# Patient Record
Sex: Female | Born: 1935 | Race: White | Hispanic: No | Smoking: Never smoker
Health system: Southern US, Community
[De-identification: ages and names within clinical notes are randomized; demographics above are authoritative.]

## PROBLEM LIST (undated history)

## (undated) DIAGNOSIS — S72009A Fracture of unspecified part of neck of unspecified femur, initial encounter for closed fracture: Secondary | ICD-10-CM

## (undated) DIAGNOSIS — G43909 Migraine, unspecified, not intractable, without status migrainosus: Secondary | ICD-10-CM

## (undated) DIAGNOSIS — I2510 Atherosclerotic heart disease of native coronary artery without angina pectoris: Secondary | ICD-10-CM

## (undated) DIAGNOSIS — R112 Nausea with vomiting, unspecified: Secondary | ICD-10-CM

## (undated) DIAGNOSIS — N390 Urinary tract infection, site not specified: Secondary | ICD-10-CM

## (undated) DIAGNOSIS — F32A Depression, unspecified: Secondary | ICD-10-CM

## (undated) DIAGNOSIS — F419 Anxiety disorder, unspecified: Secondary | ICD-10-CM

## (undated) DIAGNOSIS — C4490 Unspecified malignant neoplasm of skin, unspecified: Secondary | ICD-10-CM

## (undated) DIAGNOSIS — F329 Major depressive disorder, single episode, unspecified: Secondary | ICD-10-CM

## (undated) DIAGNOSIS — C539 Malignant neoplasm of cervix uteri, unspecified: Secondary | ICD-10-CM

## (undated) DIAGNOSIS — E162 Hypoglycemia, unspecified: Secondary | ICD-10-CM

## (undated) DIAGNOSIS — E785 Hyperlipidemia, unspecified: Secondary | ICD-10-CM

## (undated) DIAGNOSIS — J449 Chronic obstructive pulmonary disease, unspecified: Secondary | ICD-10-CM

## (undated) DIAGNOSIS — J45909 Unspecified asthma, uncomplicated: Secondary | ICD-10-CM

## (undated) DIAGNOSIS — Z9889 Other specified postprocedural states: Secondary | ICD-10-CM

## (undated) DIAGNOSIS — M1990 Unspecified osteoarthritis, unspecified site: Secondary | ICD-10-CM

## (undated) DIAGNOSIS — R011 Cardiac murmur, unspecified: Secondary | ICD-10-CM

## (undated) DIAGNOSIS — D649 Anemia, unspecified: Secondary | ICD-10-CM

## (undated) HISTORY — PX: MULTIPLE TOOTH EXTRACTIONS: SHX2053

## (undated) HISTORY — PX: VAGINAL HYSTERECTOMY: SUR661

## (undated) HISTORY — PX: APPENDECTOMY: SHX54

## (undated) HISTORY — DX: Fracture of unspecified part of neck of unspecified femur, initial encounter for closed fracture: S72.009A

## (undated) HISTORY — PX: CATARACT EXTRACTION W/ INTRAOCULAR LENS  IMPLANT, BILATERAL: SHX1307

## (undated) HISTORY — PX: SKIN CANCER EXCISION: SHX779

---

## 2001-10-06 ENCOUNTER — Other Ambulatory Visit: Admission: RE | Admit: 2001-10-06 | Discharge: 2001-10-06 | Payer: Self-pay | Admitting: Family Medicine

## 2003-04-28 ENCOUNTER — Other Ambulatory Visit: Admission: RE | Admit: 2003-04-28 | Discharge: 2003-04-28 | Payer: Self-pay | Admitting: Family Medicine

## 2003-06-25 ENCOUNTER — Encounter: Payer: Self-pay | Admitting: *Deleted

## 2003-06-25 ENCOUNTER — Emergency Department (HOSPITAL_COMMUNITY): Admission: EM | Admit: 2003-06-25 | Discharge: 2003-06-25 | Payer: Self-pay | Admitting: *Deleted

## 2003-11-13 ENCOUNTER — Emergency Department (HOSPITAL_COMMUNITY): Admission: AD | Admit: 2003-11-13 | Discharge: 2003-11-13 | Payer: Self-pay | Admitting: Emergency Medicine

## 2008-01-08 ENCOUNTER — Ambulatory Visit (HOSPITAL_COMMUNITY): Admission: RE | Admit: 2008-01-08 | Discharge: 2008-01-08 | Payer: Self-pay | Admitting: Internal Medicine

## 2008-04-27 ENCOUNTER — Emergency Department (HOSPITAL_COMMUNITY): Admission: EM | Admit: 2008-04-27 | Discharge: 2008-04-27 | Payer: Self-pay | Admitting: Emergency Medicine

## 2009-10-17 ENCOUNTER — Encounter: Admission: RE | Admit: 2009-10-17 | Discharge: 2009-10-17 | Payer: Self-pay | Admitting: Family Medicine

## 2009-10-24 ENCOUNTER — Encounter: Admission: RE | Admit: 2009-10-24 | Discharge: 2009-10-24 | Payer: Self-pay | Admitting: Family Medicine

## 2010-07-11 ENCOUNTER — Encounter: Admission: RE | Admit: 2010-07-11 | Discharge: 2010-07-11 | Payer: Self-pay | Admitting: Family Medicine

## 2011-08-02 LAB — POCT I-STAT, CHEM 8
Creatinine, Ser: 0.9
Glucose, Bld: 85
HCT: 35 — ABNORMAL LOW
Hemoglobin: 11.9 — ABNORMAL LOW
Potassium: 4.1
Sodium: 142
TCO2: 26

## 2011-08-02 LAB — URINALYSIS, ROUTINE W REFLEX MICROSCOPIC
Nitrite: NEGATIVE
Protein, ur: NEGATIVE
Specific Gravity, Urine: 1.009
Urobilinogen, UA: 0.2

## 2011-08-02 LAB — DIFFERENTIAL
Basophils Relative: 1
Eosinophils Absolute: 0.2
Monocytes Relative: 9
Neutro Abs: 3.2
Neutrophils Relative %: 44

## 2011-08-02 LAB — CBC
Hemoglobin: 11.7 — ABNORMAL LOW
MCHC: 34.7
RBC: 3.58 — ABNORMAL LOW
RDW: 13.7

## 2011-08-02 LAB — CK: Total CK: 188 — ABNORMAL HIGH

## 2011-08-02 LAB — URINE MICROSCOPIC-ADD ON

## 2013-08-14 ENCOUNTER — Other Ambulatory Visit (HOSPITAL_COMMUNITY): Payer: Self-pay | Admitting: Family Medicine

## 2013-08-14 ENCOUNTER — Ambulatory Visit (HOSPITAL_COMMUNITY)
Admission: RE | Admit: 2013-08-14 | Discharge: 2013-08-14 | Disposition: A | Discharge status: Home / Self Care | Payer: Medicare Other | Source: Ambulatory Visit | Attending: Family Medicine | Admitting: Family Medicine

## 2013-08-14 DIAGNOSIS — R52 Pain, unspecified: Secondary | ICD-10-CM

## 2013-08-14 DIAGNOSIS — M25559 Pain in unspecified hip: Secondary | ICD-10-CM | POA: Insufficient documentation

## 2013-08-14 DIAGNOSIS — M51379 Other intervertebral disc degeneration, lumbosacral region without mention of lumbar back pain or lower extremity pain: Secondary | ICD-10-CM | POA: Insufficient documentation

## 2013-08-14 DIAGNOSIS — M5137 Other intervertebral disc degeneration, lumbosacral region: Secondary | ICD-10-CM | POA: Insufficient documentation

## 2013-08-14 DIAGNOSIS — M1710 Unilateral primary osteoarthritis, unspecified knee: Secondary | ICD-10-CM | POA: Insufficient documentation

## 2013-08-14 DIAGNOSIS — M899 Disorder of bone, unspecified: Secondary | ICD-10-CM | POA: Insufficient documentation

## 2015-05-18 ENCOUNTER — Encounter (HOSPITAL_COMMUNITY): Payer: Self-pay | Admitting: Emergency Medicine

## 2015-05-18 ENCOUNTER — Emergency Department (HOSPITAL_COMMUNITY)
Admission: EM | Admit: 2015-05-18 | Discharge: 2015-05-18 | Disposition: A | Discharge status: Home / Self Care | Payer: Medicare Other | Attending: Emergency Medicine | Admitting: Emergency Medicine

## 2015-05-18 ENCOUNTER — Emergency Department (HOSPITAL_COMMUNITY): Payer: Medicare Other

## 2015-05-18 DIAGNOSIS — J441 Chronic obstructive pulmonary disease with (acute) exacerbation: Secondary | ICD-10-CM | POA: Insufficient documentation

## 2015-05-18 DIAGNOSIS — F419 Anxiety disorder, unspecified: Secondary | ICD-10-CM | POA: Diagnosis not present

## 2015-05-18 DIAGNOSIS — Z862 Personal history of diseases of the blood and blood-forming organs and certain disorders involving the immune mechanism: Secondary | ICD-10-CM | POA: Diagnosis not present

## 2015-05-18 DIAGNOSIS — R531 Weakness: Secondary | ICD-10-CM | POA: Diagnosis not present

## 2015-05-18 DIAGNOSIS — E119 Type 2 diabetes mellitus without complications: Secondary | ICD-10-CM | POA: Insufficient documentation

## 2015-05-18 DIAGNOSIS — Z880 Allergy status to penicillin: Secondary | ICD-10-CM | POA: Insufficient documentation

## 2015-05-18 DIAGNOSIS — R0602 Shortness of breath: Secondary | ICD-10-CM

## 2015-05-18 DIAGNOSIS — Z8739 Personal history of other diseases of the musculoskeletal system and connective tissue: Secondary | ICD-10-CM | POA: Diagnosis not present

## 2015-05-18 DIAGNOSIS — Z8541 Personal history of malignant neoplasm of cervix uteri: Secondary | ICD-10-CM | POA: Insufficient documentation

## 2015-05-18 DIAGNOSIS — Z91040 Latex allergy status: Secondary | ICD-10-CM | POA: Diagnosis not present

## 2015-05-18 DIAGNOSIS — R200 Anesthesia of skin: Secondary | ICD-10-CM | POA: Diagnosis not present

## 2015-05-18 HISTORY — DX: Unspecified osteoarthritis, unspecified site: M19.90

## 2015-05-18 HISTORY — DX: Major depressive disorder, single episode, unspecified: F32.9

## 2015-05-18 HISTORY — DX: Malignant neoplasm of cervix uteri, unspecified: C53.9

## 2015-05-18 HISTORY — DX: Anemia, unspecified: D64.9

## 2015-05-18 HISTORY — DX: Depression, unspecified: F32.A

## 2015-05-18 HISTORY — DX: Chronic obstructive pulmonary disease, unspecified: J44.9

## 2015-05-18 HISTORY — DX: Hyperlipidemia, unspecified: E78.5

## 2015-05-18 HISTORY — DX: Anxiety disorder, unspecified: F41.9

## 2015-05-18 HISTORY — DX: Unspecified asthma, uncomplicated: J45.909

## 2015-05-18 LAB — CBC WITH DIFFERENTIAL/PLATELET
Basophils Absolute: 0.1 10*3/uL (ref 0.0–0.1)
Basophils Relative: 1 % (ref 0–1)
Eosinophils Absolute: 0.1 10*3/uL (ref 0.0–0.7)
Eosinophils Relative: 1 % (ref 0–5)
HEMATOCRIT: 35.7 % — AB (ref 36.0–46.0)
HEMOGLOBIN: 11.6 g/dL — AB (ref 12.0–15.0)
LYMPHS ABS: 2.7 10*3/uL (ref 0.7–4.0)
Lymphocytes Relative: 27 % (ref 12–46)
MCH: 31.1 pg (ref 26.0–34.0)
MCHC: 32.5 g/dL (ref 30.0–36.0)
MCV: 95.7 fL (ref 78.0–100.0)
MONOS PCT: 9 % (ref 3–12)
Monocytes Absolute: 0.9 10*3/uL (ref 0.1–1.0)
Neutro Abs: 6.3 10*3/uL (ref 1.7–7.7)
Neutrophils Relative %: 62 % (ref 43–77)
PLATELETS: 226 10*3/uL (ref 150–400)
RBC: 3.73 MIL/uL — ABNORMAL LOW (ref 3.87–5.11)
RDW: 14 % (ref 11.5–15.5)
WBC: 10.2 10*3/uL (ref 4.0–10.5)

## 2015-05-18 LAB — URINALYSIS, ROUTINE W REFLEX MICROSCOPIC
Bilirubin Urine: NEGATIVE
GLUCOSE, UA: NEGATIVE mg/dL
HGB URINE DIPSTICK: NEGATIVE
KETONES UR: NEGATIVE mg/dL
NITRITE: POSITIVE — AB
Protein, ur: NEGATIVE mg/dL
Specific Gravity, Urine: 1.008 (ref 1.005–1.030)
Urobilinogen, UA: 0.2 mg/dL (ref 0.0–1.0)
pH: 7 (ref 5.0–8.0)

## 2015-05-18 LAB — COMPREHENSIVE METABOLIC PANEL
ALK PHOS: 50 U/L (ref 38–126)
ALT: 16 U/L (ref 14–54)
ANION GAP: 8 (ref 5–15)
AST: 28 U/L (ref 15–41)
Albumin: 3.6 g/dL (ref 3.5–5.0)
BUN: 14 mg/dL (ref 6–20)
CALCIUM: 8.9 mg/dL (ref 8.9–10.3)
CHLORIDE: 111 mmol/L (ref 101–111)
CO2: 22 mmol/L (ref 22–32)
Creatinine, Ser: 0.97 mg/dL (ref 0.44–1.00)
GFR calc non Af Amer: 52 mL/min — ABNORMAL LOW (ref >60)
Glucose, Bld: 97 mg/dL (ref 65–99)
Potassium: 5.1 mmol/L (ref 3.5–5.1)
SODIUM: 141 mmol/L (ref 135–145)
Total Bilirubin: 1 mg/dL (ref 0.3–1.2)
Total Protein: 6.7 g/dL (ref 6.5–8.1)

## 2015-05-18 LAB — URINE MICROSCOPIC-ADD ON

## 2015-05-18 LAB — I-STAT TROPONIN, ED: Troponin i, poc: 0.07 ng/mL (ref 0.00–0.08)

## 2015-05-18 LAB — CBG MONITORING, ED: GLUCOSE-CAPILLARY: 112 mg/dL — AB (ref 65–99)

## 2015-05-18 MED ORDER — CEPHALEXIN 500 MG PO CAPS: 500.0000 mg | ORAL_CAPSULE | Freq: Three times a day (TID) | ORAL | Status: AC

## 2015-05-18 MED ORDER — SODIUM CHLORIDE 0.9 % IV BOLUS (SEPSIS)
500.0000 mL | Freq: Once | INTRAVENOUS | Status: AC
  Administered 2015-05-18: 500 mL via INTRAVENOUS

## 2015-05-18 MED ORDER — DEXTROSE 5 % IV SOLN
1.0000 g | Freq: Once | INTRAVENOUS | Status: AC
  Administered 2015-05-18: 1 g via INTRAVENOUS
  Filled 2015-05-18: qty 10

## 2015-05-18 NOTE — ED Notes (Signed)
Pt in xray

## 2015-05-18 NOTE — Discharge Instructions (Signed)
Shortness of Breath °Shortness of breath means you have trouble breathing. Shortness of breath needs medical care right away. °HOME CARE  °· Do not smoke. °· Avoid being around chemicals or things (paint fumes, dust) that may bother your breathing. °· Rest as needed. Slowly begin your normal activities. °· Only take medicines as told by your doctor. °· Keep all doctor visits as told. °GET HELP RIGHT AWAY IF:  °· Your shortness of breath gets worse. °· You feel lightheaded, pass out (faint), or have a cough that is not helped by medicine. °· You cough up blood. °· You have pain with breathing. °· You have pain in your chest, arms, shoulders, or belly (abdomen). °· You have a fever. °· You cannot walk up stairs or exercise the way you normally do. °· You do not get better in the time expected. °· You have a hard time doing normal activities even with rest. °· You have problems with your medicines. °· You have any new symptoms. °MAKE SURE YOU: °· Understand these instructions. °· Will watch your condition. °· Will get help right away if you are not doing well or get worse. °Document Released: 04/09/2008 Document Revised: 10/27/2013 Document Reviewed: 01/07/2012 °Exit[employer]® Patient Information ©2015 [employer]. This information is not intended to replace advice given to you by your health care provider. Make sure you discuss any questions you have with your health care provider. ° °

## 2015-05-18 NOTE — ED Provider Notes (Addendum)
CSN: [id number]     Arrival date & time 05/18/15  1125 History   First MD Initiated Contact with Patient 05/18/15 1128     Chief Complaint  Patient presents with  . Anxiety     (Consider location/radiation/quality/duration/timing/severity/associated sxs/prior Treatment) Patient is a 81 y.o. female presenting with anxiety and shortness of breath. The history is provided by the patient.  Anxiety Associated symptoms include shortness of breath. Pertinent negatives include no chest pain, no abdominal pain and no headaches.  Shortness of Breath Severity:  Mild Onset quality:  Gradual Timing:  Constant Progression:  Resolved Chronicity:  New Context comment:  While walking outside Relieved by:  Nothing Worsened by:  Nothing tried Ineffective treatments:  None tried Associated symptoms: no abdominal pain, no chest pain, no cough, no fever, no headaches, no neck pain and no vomiting     Past Medical History  Diagnosis Date  . Diabetes mellitus without complication   . Hyperlipidemia   . Anxiety   . Depression   . Arthritis   . Asthma   . COPD (chronic obstructive pulmonary disease)   . Cancer   . Cervical cancer   . Anemia    Past Surgical History  Procedure Laterality Date  . Appendectomy    . Abdominal hysterectomy    . Cataract extraction     History reviewed. No pertinent family history. History  Substance Use Topics  . Smoking status: Never Smoker   . Smokeless tobacco: Never Used  . Alcohol Use: No   OB History    No data available     Review of Systems  Constitutional: Negative for fever and fatigue.  HENT: Negative for congestion and drooling.   Eyes: Negative for pain.  Respiratory: Positive for shortness of breath. Negative for cough.   Cardiovascular: Negative for chest pain.  Gastrointestinal: Negative for nausea, vomiting, abdominal pain and diarrhea.  Genitourinary: Negative for dysuria and hematuria.  Musculoskeletal: Negative for back pain, gait  problem and neck pain.  Skin: Negative for color change.  Neurological: Positive for weakness. Negative for dizziness and headaches.  Hematological: Negative for adenopathy.  Psychiatric/Behavioral: Negative for behavioral problems.  All other systems reviewed and are negative.     Allergies  Latex and Penicillins  Home Medications   Prior to Admission medications   Not on File   BP 185/67 mmHg  Pulse 102  Temp(Src) 98.2 F (36.8 C) (Oral)  Resp 12  SpO2 96% Physical Exam  Constitutional: She is oriented to person, place, and time. She appears well-developed and well-nourished.  HENT:  Head: Normocephalic and atraumatic.  Mouth/Throat: Oropharynx is clear and moist. No oropharyngeal exudate.  Eyes: Conjunctivae and EOM are normal. Pupils are equal, round, and reactive to light.  Neck: Normal range of motion. Neck supple.  Cardiovascular: Normal rate, regular rhythm, normal heart sounds and intact distal pulses.  Exam reveals no gallop and no friction rub.   No murmur heard. Pulmonary/Chest: Effort normal and breath sounds normal. No respiratory distress. She has no wheezes.  Abdominal: Soft. Bowel sounds are normal. There is no tenderness. There is no rebound and no guarding.  Musculoskeletal: Normal range of motion. She exhibits no edema or tenderness.  Neurological: She is alert and oriented to person, place, and time.  alert, oriented x3 speech: normal in context and clarity memory: intact grossly cranial nerves II-XII: intact motor strength: full proximally and distally Mild tremulousness sensation: intact to light touch diffusely  cerebellar: finger-to-nose and heel-to-shin intact gait:  Normal gait  Skin: Skin is warm and dry.  Psychiatric: She has a normal mood and affect. Her behavior is normal.  Nursing note and vitals reviewed.   ED Course  Procedures (including critical care time) Labs Review Labs Reviewed  CBC WITH DIFFERENTIAL/PLATELET - Abnormal;  Notable for the following:    RBC 3.73 (*)    Hemoglobin 11.6 (*)    HCT 35.7 (*)    All other components within normal limits  COMPREHENSIVE METABOLIC PANEL - Abnormal; Notable for the following:    GFR calc non Af Amer 52 (*)    All other components within normal limits  URINALYSIS, ROUTINE W REFLEX MICROSCOPIC (NOT AT [facility]) - Abnormal; Notable for the following:    APPearance CLOUDY (*)    Nitrite POSITIVE (*)    Leukocytes, UA SMALL (*)    All other components within normal limits  URINE MICROSCOPIC-ADD ON - Abnormal; Notable for the following:    Squamous Epithelial / LPF FEW (*)    Bacteria, UA MANY (*)    All other components within normal limits  CBG MONITORING, ED - Abnormal; Notable for the following:    Glucose-Capillary 112 (*)    All other components within normal limits  URINE CULTURE  I-STAT TROPOININ, ED    Imaging Review Dg Chest 2 View  05/18/2015   CLINICAL DATA:  Hypoglycemia. Weakness. Subsequent tachycardia after drinking orange juice. Shortness of breath.  EXAM: CHEST  2 VIEW  COMPARISON:  07/11/2010  FINDINGS: Stable left basilar scarring. Mild chronic interstitial accentuation, not changed from 2009.  Mild atherosclerotic calcification in the aortic arch.  IMPRESSION: 1. Chronic scarring in the lingula. Mild chronic interstitial accentuation. No acute findings. 2. Mild aortic arch atherosclerotic calcification.   Electronically Signed   By: Van Clines M.D.   On: 05/18/2015 13:35   Ct Head Wo Contrast  05/18/2015   CLINICAL DATA:  Confusion  EXAM: CT HEAD WITHOUT CONTRAST  TECHNIQUE: Contiguous axial images were obtained from the base of the skull through the vertex without intravenous contrast.  COMPARISON:  Report of prior study June 25, 2003 available; images from that study cannot be retrieved.  FINDINGS: The ventricles are normal in size and configuration for age. There is no intracranial mass, hemorrhage, extra-axial fluid collection, or midline  shift. There is moderate small vessel disease throughout the centra semiovale bilaterally. Elsewhere gray-white compartments appear normal. There is no demonstrable acute infarct. The bony calvarium appears intact. The mastoid air cells are clear. There is opacification of several ethmoid air cells bilaterally, more on the right than on the left.  IMPRESSION: Periventricular small vessel disease. No acute infarct appreciable. No hemorrhage or mass effect. Ethmoid sinus disease, more on the right than on the left.   Electronically Signed   By: Lowella Grip III M.D.   On: 05/18/2015 13:46     EKG Interpretation   Date/Time:  Wednesday May 18 2015 11:39:41 EDT Ventricular Rate:  105 PR Interval:  161 QRS Duration: 89 QT Interval:  350 QTC Calculation: 463 R Axis:   -19 Text Interpretation:  Sinus tachycardia Borderline left axis deviation  Confirmed by Jeris Easterly  MD, Glendi Mohiuddin ([id number]) on 05/18/2015 12:54:47 PM      MDM   Final diagnoses:  SOB (shortness of breath)    12:15 PM 81 y.o. female with a history of diabetes, COPD, anemia who presents with shortness of breath. She states that she was walking outside a little bit before 10 PM this  morning. She had gradual onset shortness of breath consistent with mild asthma and states that she went inside to use her inhaler. She states that she developed some numbness of her entire head and did not feel like the inhaler hasn't helped. She became very anxious. She states that she drank orange juice with sugar and [facility] as she thought that she may have low blood sugar. Her symptoms resolved in route. She is currently asymptomatic. She denies any chest pain or shortness of breath. She has a normal neurologic exam with the exception of some generalized weakness. Vital signs showing a heart rate of 102 but otherwise unremarkable. We'll get screening labs and imaging.  4:07 PM: Found to have UTI, got rocephin here. She remains asx on exam. VSS.  Pt  ambulating here w/out difficulty. Possibly a hypoglycemic episode that she had earlier given resolution by the time she go to the hospital.  I have discussed the diagnosis/risks/treatment options with the patient and family and believe the pt to be eligible for discharge home to follow-up with her pcp. We also discussed returning to the ED immediately if new or worsening sx occur. We discussed the sx which are most concerning (e.g., sob, cp, fever) that necessitate immediate return. Medications administered to the patient during their visit and any new prescriptions provided to the patient are listed below.  Medications given during this visit Medications  sodium chloride 0.9 % bolus 500 mL (0 mLs Intravenous Stopped 05/18/15 1427)  cefTRIAXone (ROCEPHIN) 1 g in dextrose 5 % 50 mL IVPB (1 g Intravenous New Bag/Given 05/18/15 1536)    New Prescriptions   CEPHALEXIN (KEFLEX) 500 MG CAPSULE    Take 1 capsule (500 mg total) by mouth 3 (three) times daily.     Pamella Pert, MD 05/18/15 2112  Pamella Pert, MD 05/18/15 2113

## 2015-05-18 NOTE — ED Notes (Signed)
Pt from home via [facility].  Pt was doing yard work, came inside to use her inhaler.  Pt told EMS she was feeling like her blood sugar was low, drank orange juice and called family.  Pt didn't feel like the juice was helping, was hyperventilating, reports having head numbness, confusion, and shaking.  Pt reports feeling scared.  Calm on arrival to ED.  Pt in NAD, A&O.

## 2015-05-20 LAB — URINE CULTURE

## 2015-05-21 ENCOUNTER — Telehealth: Payer: Self-pay | Admitting: Emergency Medicine

## 2015-05-21 NOTE — Telephone Encounter (Signed)
Post ED Visit - Positive Culture Follow-up: Successful Patient Follow-Up  Culture assessed and recommendations reviewed by: []  Heide Guile, Pharm.D., BCPS-AQ ID []  Alycia Rossetti, Pharm.D., BCPS []  [place], Pharm.D., BCPS, AAHIVP []  Legrand Como, Pharm.D., BCPS, AAHIVP []  Tegan Magsam, Pharm.D. [x]  Salome Arnt, Pharm.D.  Positive Urine culture  []  Patient discharged without antimicrobial prescription and treatment is now indicated [x]  Organism is resistant to prescribed ED discharge antimicrobial []  Patient with positive blood cultures  Changes discussed with ED provider: Dahlia Bailiff, PA New antibiotic prescription: D/C Keflex,  If symptomatic Cipro 250 mg PO BID x 7 days Patient denies symptoms.  Contacted patient, date 05/21/15, time 1138 ID verified, instructed to [place], patient denies any symptoms, states she "feels good"  Ernesta Amble 05/21/2015, 11:41 AM

## 2015-05-21 NOTE — Progress Notes (Signed)
ED Antimicrobial Stewardship Positive Culture Follow Up   Amy Kaiser is an 81 y.o. female who presented to [facility] on 05/18/2015 with a chief complaint of  Chief Complaint  Patient presents with  . Anxiety    Recent Results (from the past 720 hour(s))  Urine culture     Status: None   Collection Time: 05/18/15 12:12 PM  Result Value Ref Range Status   Specimen Description URINE, RANDOM  Final   Special Requests NONE  Final   Culture >=100,000 COLONIES/mL ENTEROBACTER CLOACAE  Final   Report Status 05/20/2015 FINAL  Final   Organism ID, Bacteria ENTEROBACTER CLOACAE  Final      Susceptibility   Enterobacter cloacae - MIC*    CEFAZOLIN <=4 RESISTANT Resistant     CEFTRIAXONE <=1 SENSITIVE Sensitive     CIPROFLOXACIN <=0.25 SENSITIVE Sensitive     GENTAMICIN <=1 SENSITIVE Sensitive     IMIPENEM <=0.25 SENSITIVE Sensitive     NITROFURANTOIN 64 INTERMEDIATE Intermediate     TRIMETH/SULFA <=20 SENSITIVE Sensitive     PIP/TAZO <=4 SENSITIVE Sensitive     * >=100,000 COLONIES/mL ENTEROBACTER CLOACAE    [x]  Treated with cephalexin, organism resistant to prescribed antimicrobial []  Patient discharged originally without antimicrobial agent and treatment is now indicated  New antibiotic prescription:  If symptomatic, DC cephalexin and start ciprofloxacin 250mg  PO BID x 7 days  ED Provider: Dahlia Bailiff, PA  [place], Rande Lawman 05/21/2015, 9:34 AM Clinical Pharmacist Phone# [phone]

## 2016-01-13 DIAGNOSIS — F419 Anxiety disorder, unspecified: Secondary | ICD-10-CM | POA: Diagnosis not present

## 2016-01-13 DIAGNOSIS — M810 Age-related osteoporosis without current pathological fracture: Secondary | ICD-10-CM | POA: Diagnosis not present

## 2016-01-13 DIAGNOSIS — E785 Hyperlipidemia, unspecified: Secondary | ICD-10-CM | POA: Diagnosis not present

## 2016-01-13 DIAGNOSIS — E559 Vitamin D deficiency, unspecified: Secondary | ICD-10-CM | POA: Diagnosis not present

## 2016-01-13 DIAGNOSIS — R011 Cardiac murmur, unspecified: Secondary | ICD-10-CM | POA: Diagnosis not present

## 2016-01-13 DIAGNOSIS — J4530 Mild persistent asthma, uncomplicated: Secondary | ICD-10-CM | POA: Diagnosis not present

## 2016-01-18 DIAGNOSIS — I10 Essential (primary) hypertension: Secondary | ICD-10-CM | POA: Diagnosis not present

## 2016-01-18 DIAGNOSIS — R011 Cardiac murmur, unspecified: Secondary | ICD-10-CM | POA: Diagnosis not present

## 2016-01-18 DIAGNOSIS — E785 Hyperlipidemia, unspecified: Secondary | ICD-10-CM | POA: Diagnosis not present

## 2016-01-23 DIAGNOSIS — R011 Cardiac murmur, unspecified: Secondary | ICD-10-CM | POA: Diagnosis not present

## 2016-04-05 DIAGNOSIS — R42 Dizziness and giddiness: Secondary | ICD-10-CM | POA: Diagnosis not present

## 2016-04-27 ENCOUNTER — Other Ambulatory Visit: Payer: Self-pay

## 2016-04-27 ENCOUNTER — Encounter (HOSPITAL_COMMUNITY): Payer: Self-pay | Admitting: Emergency Medicine

## 2016-04-27 ENCOUNTER — Emergency Department (HOSPITAL_COMMUNITY): Payer: Medicare Other

## 2016-04-27 ENCOUNTER — Emergency Department (HOSPITAL_COMMUNITY)
Admission: EM | Admit: 2016-04-27 | Discharge: 2016-04-27 | Disposition: A | Discharge status: Home / Self Care | Payer: Medicare Other | Attending: Emergency Medicine | Admitting: Emergency Medicine

## 2016-04-27 DIAGNOSIS — Z91040 Latex allergy status: Secondary | ICD-10-CM | POA: Diagnosis not present

## 2016-04-27 DIAGNOSIS — Z79899 Other long term (current) drug therapy: Secondary | ICD-10-CM | POA: Insufficient documentation

## 2016-04-27 DIAGNOSIS — R5383 Other fatigue: Secondary | ICD-10-CM | POA: Diagnosis not present

## 2016-04-27 DIAGNOSIS — Z8541 Personal history of malignant neoplasm of cervix uteri: Secondary | ICD-10-CM | POA: Insufficient documentation

## 2016-04-27 DIAGNOSIS — E785 Hyperlipidemia, unspecified: Secondary | ICD-10-CM | POA: Insufficient documentation

## 2016-04-27 DIAGNOSIS — M545 Low back pain: Secondary | ICD-10-CM | POA: Diagnosis present

## 2016-04-27 DIAGNOSIS — E119 Type 2 diabetes mellitus without complications: Secondary | ICD-10-CM | POA: Insufficient documentation

## 2016-04-27 DIAGNOSIS — J449 Chronic obstructive pulmonary disease, unspecified: Secondary | ICD-10-CM | POA: Diagnosis not present

## 2016-04-27 DIAGNOSIS — R05 Cough: Secondary | ICD-10-CM | POA: Diagnosis not present

## 2016-04-27 DIAGNOSIS — N390 Urinary tract infection, site not specified: Secondary | ICD-10-CM

## 2016-04-27 LAB — CBC WITH DIFFERENTIAL/PLATELET
BASOS ABS: 0 10*3/uL (ref 0.0–0.1)
BASOS PCT: 0 %
EOS ABS: 0 10*3/uL (ref 0.0–0.7)
Eosinophils Relative: 0 %
HCT: 34 % — ABNORMAL LOW (ref 36.0–46.0)
HEMOGLOBIN: 11.2 g/dL — AB (ref 12.0–15.0)
Lymphocytes Relative: 13 %
Lymphs Abs: 1.5 10*3/uL (ref 0.7–4.0)
MCH: 31.5 pg (ref 26.0–34.0)
MCHC: 32.9 g/dL (ref 30.0–36.0)
MCV: 95.5 fL (ref 78.0–100.0)
Monocytes Absolute: 1.4 10*3/uL — ABNORMAL HIGH (ref 0.1–1.0)
Monocytes Relative: 12 %
Neutro Abs: 8.8 10*3/uL — ABNORMAL HIGH (ref 1.7–7.7)
Neutrophils Relative %: 75 %
Platelets: 199 10*3/uL (ref 150–400)
RBC: 3.56 MIL/uL — AB (ref 3.87–5.11)
RDW: 13.5 % (ref 11.5–15.5)
WBC: 11.7 10*3/uL — AB (ref 4.0–10.5)

## 2016-04-27 LAB — COMPREHENSIVE METABOLIC PANEL
ALT: 14 U/L (ref 14–54)
AST: 20 U/L (ref 15–41)
Albumin: 3.2 g/dL — ABNORMAL LOW (ref 3.5–5.0)
Alkaline Phosphatase: 62 U/L (ref 38–126)
Anion gap: 10 (ref 5–15)
BUN: 13 mg/dL (ref 6–20)
CO2: 25 mmol/L (ref 22–32)
Calcium: 8.6 mg/dL — ABNORMAL LOW (ref 8.9–10.3)
Chloride: 100 mmol/L — ABNORMAL LOW (ref 101–111)
Creatinine, Ser: 1.19 mg/dL — ABNORMAL HIGH (ref 0.44–1.00)
GFR calc Af Amer: 47 mL/min — ABNORMAL LOW (ref >60)
GFR, EST NON AFRICAN AMERICAN: 40 mL/min — AB (ref >60)
Glucose, Bld: 125 mg/dL — ABNORMAL HIGH (ref 65–99)
POTASSIUM: 4 mmol/L (ref 3.5–5.1)
Sodium: 135 mmol/L (ref 135–145)
TOTAL PROTEIN: 7 g/dL (ref 6.5–8.1)
Total Bilirubin: 1.2 mg/dL (ref 0.3–1.2)

## 2016-04-27 LAB — URINALYSIS, ROUTINE W REFLEX MICROSCOPIC
BILIRUBIN URINE: NEGATIVE
Glucose, UA: NEGATIVE mg/dL
KETONES UR: 15 mg/dL — AB
NITRITE: POSITIVE — AB
Protein, ur: 100 mg/dL — AB
Specific Gravity, Urine: 1.025 (ref 1.005–1.030)
pH: 5.5 (ref 5.0–8.0)

## 2016-04-27 LAB — URINE MICROSCOPIC-ADD ON

## 2016-04-27 LAB — LIPASE, BLOOD: LIPASE: 30 U/L (ref 11–51)

## 2016-04-27 LAB — I-STAT TROPONIN, ED: TROPONIN I, POC: 0.01 ng/mL (ref 0.00–0.08)

## 2016-04-27 MED ORDER — ONDANSETRON HCL 4 MG PO TABS: 4.0000 mg | ORAL_TABLET | Freq: Four times a day (QID) | ORAL | Status: DC

## 2016-04-27 MED ORDER — CEPHALEXIN 500 MG PO CAPS: 500.0000 mg | ORAL_CAPSULE | Freq: Two times a day (BID) | ORAL | Status: DC

## 2016-04-27 MED ORDER — ONDANSETRON HCL 4 MG/2ML IJ SOLN
4.0000 mg | Freq: Once | INTRAMUSCULAR | Status: AC
  Administered 2016-04-27: 4 mg via INTRAVENOUS
  Filled 2016-04-27: qty 2

## 2016-04-27 MED ORDER — SODIUM CHLORIDE 0.9 % IV BOLUS (SEPSIS)
500.0000 mL | Freq: Once | INTRAVENOUS | Status: AC
  Administered 2016-04-27: 500 mL via INTRAVENOUS

## 2016-04-27 MED ORDER — DEXTROSE 5 % IV SOLN
1.0000 g | Freq: Once | INTRAVENOUS | Status: AC
  Administered 2016-04-27: 1 g via INTRAVENOUS
  Filled 2016-04-27: qty 10

## 2016-04-27 NOTE — Discharge Instructions (Signed)

## 2016-04-27 NOTE — ED Provider Notes (Signed)
CSN: [id number]     Arrival date & time 04/27/16  [id number] History   First MD Initiated Contact with Patient 04/27/16 0759     Chief Complaint  Patient presents with  . Emesis  . Back Pain     Patient is a 82 y.o. female presenting with vomiting and back pain. The history is provided by the patient. No language interpreter was used.  Emesis Back Pain  Amy Kaiser is a 82 y.o. female who presents to the Emergency Department complaining of vomiting.  She reports 3 days of generalized malaise and fatigue. She had 3 episodes of emesis yesterday. She has decreased appetite and oral intake. She has increased urinary frequency and low back pain. She had a temp of 99. She had a chronic cough. No chest pain, abdominal pain. No dysuria. No diarrhea. Last bowel movement was 3 days ago. Symptoms are moderate, waxing and waning, worsening.  Past Medical History  Diagnosis Date  . Diabetes mellitus without complication ([place])   . Hyperlipidemia   . Anxiety   . Depression   . Arthritis   . Asthma   . COPD (chronic obstructive pulmonary disease) ([place])   . Cancer ([place])   . Cervical cancer ([place])   . Anemia    Past Surgical History  Procedure Laterality Date  . Appendectomy    . Abdominal hysterectomy    . Cataract extraction     No family history on file. Social History  Substance Use Topics  . Smoking status: Never Smoker   . Smokeless tobacco: Never Used  . Alcohol Use: No   OB History    No data available     Review of Systems  Gastrointestinal: Positive for vomiting.  Musculoskeletal: Positive for back pain.  All other systems reviewed and are negative.     Allergies  Codeine; Latex; Morphine and related; and Penicillins  Home Medications   Prior to Admission medications   Medication Sig Start Date End Date Taking? Authorizing Provider  albuterol (ACCUNEB) 0.63 MG/3ML nebulizer solution Take 1 ampule by nebulization every 6 (six) hours as needed for wheezing.     Historical Provider, MD  cephALEXin (KEFLEX) 500 MG capsule Take 1 capsule (500 mg total) by mouth 2 (two) times daily. 04/27/16   Quintella Reichert, MD  CRESTOR 10 MG tablet Take 10 mg by mouth every evening. 04/26/15   Historical Provider, MD  FLOVENT HFA 220 MCG/ACT inhaler Take 0.5 mg by mouth at bedtime as needed. sleep 04/07/15   Historical Provider, MD  LORazepam (ATIVAN) 0.5 MG tablet Take 0.5 mg by mouth at bedtime as needed. sleep 04/08/15   Historical Provider, MD  ondansetron (ZOFRAN) 4 MG tablet Take 1 tablet (4 mg total) by mouth every 6 (six) hours. 04/27/16   Quintella Reichert, MD  VENTOLIN HFA 108 (90 BASE) MCG/ACT inhaler Inhale 2 puffs into the lungs every 4 (four) hours as needed. Wheezing 03/14/15   Historical Provider, MD   BP 127/56 mmHg  Pulse 86  Temp(Src) 99 F (37.2 C) (Oral)  Resp 22  SpO2 98% Physical Exam  Constitutional: She is oriented to person, place, and time. She appears well-developed and well-nourished.  HENT:  Head: Normocephalic and atraumatic.  Cardiovascular: Normal rate and regular rhythm.   Murmur heard. Pulmonary/Chest: Effort normal and breath sounds normal. No respiratory distress.  Abdominal: Soft. There is no tenderness. There is no rebound and no guarding.  Musculoskeletal: She exhibits no edema or tenderness.  Neurological: She is alert  and oriented to person, place, and time.  Skin: Skin is warm and dry.  Psychiatric: She has a normal mood and affect. Her behavior is normal.  Nursing note and vitals reviewed.   ED Course  Procedures (including critical care time) Labs Review Labs Reviewed  COMPREHENSIVE METABOLIC PANEL - Abnormal; Notable for the following:    Chloride 100 (*)    Glucose, Bld 125 (*)    Creatinine, Ser 1.19 (*)    Calcium 8.6 (*)    Albumin 3.2 (*)    GFR calc non Af Amer 40 (*)    GFR calc Af Amer 47 (*)    All other components within normal limits  CBC WITH DIFFERENTIAL/PLATELET - Abnormal; Notable for the following:     WBC 11.7 (*)    RBC 3.56 (*)    Hemoglobin 11.2 (*)    HCT 34.0 (*)    Neutro Abs 8.8 (*)    Monocytes Absolute 1.4 (*)    All other components within normal limits  URINALYSIS, ROUTINE W REFLEX MICROSCOPIC (NOT AT [facility]) - Abnormal; Notable for the following:    APPearance CLOUDY (*)    Hgb urine dipstick SMALL (*)    Ketones, ur 15 (*)    Protein, ur 100 (*)    Nitrite POSITIVE (*)    Leukocytes, UA LARGE (*)    All other components within normal limits  URINE MICROSCOPIC-ADD ON - Abnormal; Notable for the following:    Squamous Epithelial / LPF 6-30 (*)    Bacteria, UA MANY (*)    All other components within normal limits  URINE CULTURE  LIPASE, BLOOD  I-STAT TROPOININ, ED    Imaging Review Dg Chest 2 View  04/27/2016  CLINICAL DATA:  Cough and vomiting for 3 days very weak EXAM: CHEST  2 VIEW COMPARISON:  None. FINDINGS: Lateral view degraded by patient arm position. Midline trachea. Normal heart size. Atherosclerosis in the transverse aorta. No pleural effusion or pneumothorax. Right cardiophrenic angle blunting is favored to be related to a prominent epicardial fat pad, possibly with mild volume loss in the medial right lung base. There is left base scarring. No congestive failure. IMPRESSION: No convincing evidence of acute cardiopulmonary disease. Right cardiophrenic angle blunting is favored to be due to a prominent epicardial fat pad. If there are prior radiographs for comparison, these could be reviewed to confirm stability. If not, this could be re-evaluated with follow-up at 6-12 weeks. Aortic and branch vessel atherosclerosis. Electronically Signed   By: Abigail Miyamoto M.D.   On: 04/27/2016 09:10   I have personally reviewed and evaluated these images and lab results as part of my medical decision-making.   EKG Interpretation   Date/Time:  Friday April 27 2016 08:18:23 EDT Ventricular Rate:  95 PR Interval:    QRS Duration: 91 QT Interval:  347 QTC Calculation:  437 R Axis:   -31 Text Interpretation:  Sinus rhythm Left axis deviation Confirmed by Hazle Coca [phone]) on 04/27/2016 8:40:07 AM      MDM   Final diagnoses:  Acute UTI   Patient here for evaluation of vomiting, malaise, low back pain yesterday. UA is consistent with UTI. Plan IV fluids in the department and patient is feeling significantly improved. She is tolerating oral fluids without difficulty. Provided dose of Rocephin in the emergency department with prescription for Keflex. Also providing Zofran for recurrent nausea. Discussed with patient and daughter homecare for UTI, close outpatient follow-up as well as close return  precautions.   Presentation is not consistent with bowel obstruction, sepsis.  Quintella Reichert, MD 04/27/16 1007

## 2016-04-27 NOTE — ED Notes (Signed)
Pt from home with c/o intermittent back pain x3 days and vomitting starting yesterday. Vomit x3 in 24 hrs, no diarrhea, denies any fever. Pt reports low back pain after vomitting. Denies pain while urinating.

## 2016-04-28 LAB — URINE CULTURE

## 2016-04-30 DIAGNOSIS — N3000 Acute cystitis without hematuria: Secondary | ICD-10-CM | POA: Diagnosis not present

## 2016-04-30 DIAGNOSIS — R938 Abnormal findings on diagnostic imaging of other specified body structures: Secondary | ICD-10-CM | POA: Diagnosis not present

## 2016-04-30 DIAGNOSIS — E860 Dehydration: Secondary | ICD-10-CM | POA: Diagnosis not present

## 2016-05-21 DIAGNOSIS — R300 Dysuria: Secondary | ICD-10-CM | POA: Diagnosis not present

## 2016-05-21 DIAGNOSIS — N390 Urinary tract infection, site not specified: Secondary | ICD-10-CM | POA: Diagnosis not present

## 2016-07-11 DIAGNOSIS — H01022 Squamous blepharitis right lower eyelid: Secondary | ICD-10-CM | POA: Diagnosis not present

## 2016-07-11 DIAGNOSIS — H01024 Squamous blepharitis left upper eyelid: Secondary | ICD-10-CM | POA: Diagnosis not present

## 2016-07-11 DIAGNOSIS — H01021 Squamous blepharitis right upper eyelid: Secondary | ICD-10-CM | POA: Diagnosis not present

## 2016-07-11 DIAGNOSIS — H02831 Dermatochalasis of right upper eyelid: Secondary | ICD-10-CM | POA: Diagnosis not present

## 2016-07-11 DIAGNOSIS — D3132 Benign neoplasm of left choroid: Secondary | ICD-10-CM | POA: Diagnosis not present

## 2016-07-11 DIAGNOSIS — H26492 Other secondary cataract, left eye: Secondary | ICD-10-CM | POA: Diagnosis not present

## 2016-07-11 DIAGNOSIS — H353131 Nonexudative age-related macular degeneration, bilateral, early dry stage: Secondary | ICD-10-CM | POA: Diagnosis not present

## 2016-07-11 DIAGNOSIS — Z961 Presence of intraocular lens: Secondary | ICD-10-CM | POA: Diagnosis not present

## 2016-07-11 DIAGNOSIS — H01025 Squamous blepharitis left lower eyelid: Secondary | ICD-10-CM | POA: Diagnosis not present

## 2016-07-11 DIAGNOSIS — H02834 Dermatochalasis of left upper eyelid: Secondary | ICD-10-CM | POA: Diagnosis not present

## 2016-07-13 DIAGNOSIS — H26492 Other secondary cataract, left eye: Secondary | ICD-10-CM | POA: Diagnosis not present

## 2016-08-30 DIAGNOSIS — J4530 Mild persistent asthma, uncomplicated: Secondary | ICD-10-CM | POA: Diagnosis not present

## 2016-08-30 DIAGNOSIS — M810 Age-related osteoporosis without current pathological fracture: Secondary | ICD-10-CM | POA: Diagnosis not present

## 2016-08-30 DIAGNOSIS — Z23 Encounter for immunization: Secondary | ICD-10-CM | POA: Diagnosis not present

## 2016-08-30 DIAGNOSIS — Z0000 Encounter for general adult medical examination without abnormal findings: Secondary | ICD-10-CM | POA: Diagnosis not present

## 2016-08-30 DIAGNOSIS — E785 Hyperlipidemia, unspecified: Secondary | ICD-10-CM | POA: Diagnosis not present

## 2016-08-30 DIAGNOSIS — F419 Anxiety disorder, unspecified: Secondary | ICD-10-CM | POA: Diagnosis not present

## 2016-09-24 DIAGNOSIS — Z23 Encounter for immunization: Secondary | ICD-10-CM | POA: Diagnosis not present

## 2016-09-24 DIAGNOSIS — E785 Hyperlipidemia, unspecified: Secondary | ICD-10-CM | POA: Diagnosis not present

## 2016-10-01 DIAGNOSIS — J45909 Unspecified asthma, uncomplicated: Secondary | ICD-10-CM | POA: Diagnosis not present

## 2016-11-01 DIAGNOSIS — J45901 Unspecified asthma with (acute) exacerbation: Secondary | ICD-10-CM | POA: Diagnosis not present

## 2016-11-01 DIAGNOSIS — R030 Elevated blood-pressure reading, without diagnosis of hypertension: Secondary | ICD-10-CM | POA: Diagnosis not present

## 2017-01-28 ENCOUNTER — Encounter: Payer: Self-pay | Admitting: Cardiology

## 2017-01-28 DIAGNOSIS — I340 Nonrheumatic mitral (valve) insufficiency: Secondary | ICD-10-CM | POA: Diagnosis not present

## 2017-01-28 DIAGNOSIS — I349 Nonrheumatic mitral valve disorder, unspecified: Secondary | ICD-10-CM | POA: Diagnosis not present

## 2017-01-28 DIAGNOSIS — E785 Hyperlipidemia, unspecified: Secondary | ICD-10-CM | POA: Diagnosis not present

## 2017-01-28 DIAGNOSIS — I10 Essential (primary) hypertension: Secondary | ICD-10-CM | POA: Diagnosis not present

## 2017-01-28 DIAGNOSIS — I350 Nonrheumatic aortic (valve) stenosis: Secondary | ICD-10-CM | POA: Diagnosis not present

## 2017-01-28 NOTE — Progress Notes (Signed)
Amy Kaiser  Date of visit:  01/28/2017 DOB:  08-Mar-1929    Age:  83 yrs. Medical record number:  [id number]     Account number:  [id number] Primary Care Provider: WHITE, CYNTHIA ____________________________ CURRENT DIAGNOSES  1. Aortic valve stenosis  2. Dyspnea  3. Hyperlipidemia  4. Essential hypertension  5. Mitral valve regurgitation  6. Hypertensive heart disease without heart failure ____________________________ ALLERGIES  Alendronate Sodium, Intolerance-unknown  Amoxicillin, Intolerance-unknown  Codeine, Intolerance-unknown  Darvocet-N 100, Intolerance-unknown  Latex, Blisters  Lisinopril, Weakness present  Pravastatin, Muscle aches  Prozac, Nervousness  Symbicort, Intolerance-unknown  Wellbutrin, Nervousness ____________________________ MEDICATIONS  1. Vitamin D3 5,000 unit tablet, 1 p.o. daily  2. Calcium 500 500 mg calcium (1,250 mg) tablet, BID  3. rosuvastatin 10 mg tablet, 1 p.o. daily  4. lorazepam 0.5 mg tablet, PRN  5. Ventolin HFA 90 mcg/actuation aerosol inhaler, PRN  6. albuterol sulfate 0.63 mg/3 mL solution for nebulization, PRN  7. Ocuvite 150 mg-30 unit-5 mg-150 mg capsule, BID ____________________________ HISTORY OF PRESENT ILLNESS Patient returns for cardiac followup. She comes in with her daughter today and states that she is doing fairly well. She still lives alone at home. She says that she has a prior history of asthma and has been having issues with exertional dyspnea. She has started using an inhaler now which she previously had not been using. Echocardiogram last year showed moderate aortic stenosis and moderate mitral regurgitation. She does not have any anginal type chest pain. She denies PND, orthopnea or significant edema. She does note difficulty due in more normal activities of daily living and has to have relatives help out with the housework now. She also has had some episodes where she might periodically feel as if she will have to grip  herself as if she is losing consciousness but has never had definite syncope. ____________________________ PAST HISTORY  Past Medical Illnesses:  hypertension, hyperlipidemia, asthma, depression, cervical cancer;  Cardiovascular Illnesses:  moderate aortic stenosis, mitral insufficiency;  Surgical Procedures:  appendectomy, cataract extraction OU, hysterectomy-subtotal;  NYHA Classification:  I;  Canadian Angina Classification:  Class 0: Asymptomatic;  Cardiology Procedures-Invasive:  no history of prior cardiac procedures;  Cardiology Procedures-Noninvasive:  echocardiogram March 2018;  LVEF of 65% documented via echocardiogram on 01/28/2017,   ____________________________ CARDIO-PULMONARY TEST DATES EKG Date:  01/18/2016;  Echocardiography Date: 01/28/2017;   ____________________________ FAMILY HISTORY Brother -- Brother alive with problem, Lupus erythematosus Brother -- Brother alive with problem, Heart disease Brother -- Brother dead, Heart disease Brother -- Brother alive with problem, Hypertension, Diabetes mellitus, COPD Father -- Father dead, Coronary Artery Disease Mother -- Coronary Artery Disease, Macular degeneration, Mother dead, Death due to natural cause Sister -- Sister alive with problem, Diabetes mellitus Sister -- Sister alive with problem, COPD Sister -- Sister alive with problem, Heart disease Sister -- Sister alive and well Sister -- Sister alive with problem, Heart disease, Macular degeneration ____________________________ SOCIAL HISTORY Alcohol Use:  no alcohol use;  Smoking:  never smoked;  Diet:  regular diet;  Lifestyle:  widowed x 2,  divorced x 2, annuled x1 and 3 children;  Exercise:  no regular exercise;  Occupation:  retired and Regulatory affairs officer;  Residence:  lives alone;   ____________________________ REVIEW OF SYSTEMS General:  denies recent weight change, fatique or change in exercise tolerance. Eyes: wears eye glasses/contact lenses, cataract extraction  bilaterally, macular degeneration Ears, Nose, Throat, Mouth:  mild partial hearing loss Respiratory: dyspnea with exertion Cardiovascular:  please review HPI Genitourinary-Female:  frequency, occasional stress incontinence Musculoskeletal:  chronic low back pain, has had cortisone injections, generalized arthritis Neurological:  denies headaches, stroke, or TIA  ____________________________ PHYSICAL EXAMINATION VITAL SIGNS  Blood Pressure:  150/80 Sitting, Right arm, regular cuff  , 158/80 Standing, Right arm and regular cuff   Pulse:  108/min. Weight:  185.00 lbs. Height:  67"BMI: 29  Constitutional:  pleasant white female, in no acute distress, mildly obese Skin:  warm and dry to touch, no apparent skin lesions, or masses noted. Head:  normocephalic, normal hair pattern, no masses or tenderness Neck:  supple, without massess. No JVD, thyromegaly or carotid bruits. Carotid upstroke normal. Chest:  normal symmetry, clear to auscultation. Cardiac:  regular rhythm, normal S1 and S2, no S3 or S4, grade 2/6 systolic murmur at aortic area radiating to neck Peripheral Pulses:  the femoral,dorsalis pedis, and posterior tibial pulses are full and equal bilaterally with no bruits auscultated. Extremities & Back:  no deformities, clubbing, cyanosis, erythema or edema observed. Normal muscle strength and tone. Neurological:  no gross motor or sensory deficits noted, affect appropriate, oriented x3. ____________________________ IMPRESSIONS/PLAN  1. Severe aortic stenosis now by echocardiogram with progression in gradient from last year 2. Hypertensive heart disease 3. Exertional dyspnea and possible presyncope but difficult to assess  Recommendations:  Discussion with patient and daughter as well as review of echocardiogram. She now has severe aortic stenosis by echo it appears to have at least class II symptoms if not more. Presyncope is interesting. A long discussion with patient and daughter. I  discussed the aortic stenosis and consideration for evaluation for TAVR. I will like her to have a consultation with Dr. Sherren Mocha for further evaluation. She likely will need a catheterization to confirm the severity of aortic stenosis. Although she is 57 she is quite vigorous and young and doesn't have a lot of other comorbidities so I think would be a reasonable candidate. I will see her back in followup after this evaluation.  Greater than 40 minutes spent including greater than 50% of time spent in face-to-face conversation and counseling with patient and daughter. ____________________________ TODAYS ORDERS  1.  Consultation with Dr. Sherren Mocha  2.  Return Visit: 6 months                       ____________________________ Cardiology Physician:  Kerry Hough MD [facility]

## 2017-01-31 ENCOUNTER — Encounter: Payer: Self-pay | Admitting: Cardiovascular Disease

## 2017-02-03 HISTORY — PX: CARDIAC CATHETERIZATION: SHX172

## 2017-02-13 DIAGNOSIS — F419 Anxiety disorder, unspecified: Secondary | ICD-10-CM | POA: Diagnosis not present

## 2017-02-13 DIAGNOSIS — E785 Hyperlipidemia, unspecified: Secondary | ICD-10-CM | POA: Diagnosis not present

## 2017-02-13 DIAGNOSIS — J4530 Mild persistent asthma, uncomplicated: Secondary | ICD-10-CM | POA: Diagnosis not present

## 2017-02-13 DIAGNOSIS — E559 Vitamin D deficiency, unspecified: Secondary | ICD-10-CM | POA: Diagnosis not present

## 2017-02-15 ENCOUNTER — Ambulatory Visit (INDEPENDENT_AMBULATORY_CARE_PROVIDER_SITE_OTHER): Payer: Medicare Other | Admitting: Cardiovascular Disease

## 2017-02-15 ENCOUNTER — Encounter: Payer: Self-pay | Admitting: Cardiovascular Disease

## 2017-02-15 ENCOUNTER — Encounter (INDEPENDENT_AMBULATORY_CARE_PROVIDER_SITE_OTHER): Payer: Self-pay

## 2017-02-15 ENCOUNTER — Other Ambulatory Visit: Payer: Self-pay | Admitting: *Deleted

## 2017-02-15 VITALS — BP 156/80 | HR 80 | Ht 66.0 in | Wt 184.1 lb

## 2017-02-15 DIAGNOSIS — I350 Nonrheumatic aortic (valve) stenosis: Secondary | ICD-10-CM

## 2017-02-15 NOTE — Patient Instructions (Addendum)
Medication Instructions:  Your physician recommends that you continue on your current medications as directed. Please refer to the Current Medication list given to you today.  Labwork: Your physician recommends that you have lab work today: BMP, CBC and PT/INR  Testing/Procedures: Your physician has requested that you have a cardiac catheterization (02/20/2017). Cardiac catheterization is used to diagnose and/or treat various heart conditions. Doctors may recommend this procedure for a number of different reasons. The most common reason is to evaluate chest pain. Chest pain can be a symptom of coronary artery disease (CAD), and cardiac catheterization can show whether plaque is narrowing or blocking your heart's arteries. This procedure is also used to evaluate the valves, as well as measure the blood flow and oxygen levels in different parts of your heart. For further information please visit [facility]. Please follow instruction sheet, as given.  Your physician has requested that you have cardiac CT (02/26/17) . Cardiac computed tomography (CT) is a painless test that uses an x-ray machine to take clear, detailed pictures of your heart. For further information please visit [email or web address]. Please follow instruction sheet as given.  Non-Cardiac CT Angiography (CTA), is a special type of CT scan that uses a computer to produce multi-dimensional views of major blood vessels throughout the body. In CT angiography, a contrast material is injected through an IV to help visualize the blood vessels (Chest/abdomen/pelvis) (02/26/17)  Pre-CT scan instructions: You are scheduled for CT scans on February 26, 2017.  Please arrive in Admitting at [facility] (Entrance A) at 9:00 AM. No solid foods, No liquids, No caffeine or smoking 4 hours prior to CT. No herbal supplements for 24 hours prior to CT.   Carotid Ultrasound on February 26, 2017 at [facility] after CT scans.  Physical Therapy Evaluation on  February 26, 2017 at 2:30, located on [employer].   Follow-Up: We will arrange further TAVR evaluation with Dr Roxy Manns and Dr Cyndia Bent. Levonne Spiller RN is the contact and the office phone number is [phone].   Any Other Special Instructions Will Be Listed Below (If Applicable).     If you need a refill on your cardiac medications before your next appointment, please call your pharmacy.

## 2017-02-15 NOTE — Progress Notes (Signed)
Cardiology Office Note Date:  02/15/2017   ID:  SHARLISA HOLLIFIELD, DOB 20-Jun-1929, MRN [id number]  PCP:  Vidal Schwalbe, MD  Cardiologist:  Dr Wynonia Lawman  Chief Complaint  Patient presents with  . TAVR consult     History of Present Illness: Amy Kaiser is a 83 y.o. female who presents for evaluation of severe aortic stenosis, referred by Dr Wynonia Lawman.  The patient is here today with her son, daughter, and grandson. She lives independently and her family members live nearby.  The patient was diagnosed with a heart murmur about one year ago and was referred to Dr. Wynonia Lawman for evaluation. She was noted to have moderate aortic stenosis at the time. She recently returned for annual follow-up and repeat echocardiogram demonstrated progression of her aortic stenosis, now severe with a peak systolic velocity of 9.47 m/s, mean gradient of 43 mmHg, and peak transaortic valve gradient of 72 mmHg. Her LV function is normal with an ejection fraction of 65%. She has no other significant valvular disease.  The patient has had asthma for over 25 years. She describes the severity of her asthma is mild. She's never been hospitalized. She uses an inhaler periodically. Over the last 6 months she has developed progressive dyspnea with exertion. She also describes chest tightness when walking up an incline. This resolves with rest. She's had no lightheadedness or presyncope associated with physical exertion. However, she's had a few spells where she feels pain without warning but has not experienced frank syncope. She denies orthopnea, PND, or leg swelling.  The patient is functionally independent. She no longer drives a car because of her occasional lightheaded spells. She's been retired for approximately 20 years. She still gets out and does some light work in the yard.   Past Medical History:  Diagnosis Date  . Anemia   . Anxiety   . Arthritis   . Asthma   . Cancer ([place])   . Cervical cancer ([place])   .  COPD (chronic obstructive pulmonary disease) ([place])   . Depression   . Diabetes mellitus without complication ([place])   . Hyperlipidemia     Past Surgical History:  Procedure Laterality Date  . ABDOMINAL HYSTERECTOMY    . APPENDECTOMY    . CATARACT EXTRACTION      Current Outpatient Prescriptions  Medication Sig Dispense Refill  . albuterol (ACCUNEB) 0.63 MG/3ML nebulizer solution Take 1 ampule by nebulization every 6 (six) hours as needed for wheezing.    . calcium-vitamin D (OSCAL WITH D) 500-200 MG-UNIT tablet Take 1 tablet by mouth 2 (two) times daily.    . Cholecalciferol (VITAMIN D3 PO) Take 1 tablet by mouth daily.    . CRESTOR 10 MG tablet Take 10 mg by mouth every evening.  12  . FLOVENT HFA 220 MCG/ACT inhaler Take 1 puff by mouth at bedtime as needed. sleep  12  . LORazepam (ATIVAN) 0.5 MG tablet Take 0.5 mg by mouth at bedtime as needed. sleep  2  . Multiple Vitamins-Minerals (ICAPS AREDS 2 PO) Take 1 capsule by mouth 2 (two) times daily.    . VENTOLIN HFA 108 (90 BASE) MCG/ACT inhaler Inhale 2 puffs into the lungs every 4 (four) hours as needed. Wheezing  2   No current facility-administered medications for this visit.     Allergies:   Alendronate sodium; Amoxicillin; Codeine; Latex; Lisinopril; Morphine and related; Penicillins; Pravachol [pravastatin sodium]; Prozac [fluoxetine hcl]; Symbicort [budesonide-formoterol fumarate]; and Wellbutrin [bupropion]   Social History:  The patient  reports that she has never smoked. She has never used smokeless tobacco. She reports that she does not drink alcohol or use drugs.   Family History:  Father died of an MI, brother had 'bad heart'   ROS:  Please see the history of present illness.  Otherwise, review of systems is positive for excessive fatigue, wheezing, anxiety, balance problems, cough.  All other systems are reviewed and negative.    PHYSICAL EXAM: VS:  BP (!) 156/80   Pulse 80   Ht 5\' 6"  (1.676 m)   Wt 184 lb 1.9 oz  (83.5 kg)   BMI 29.72 kg/m  , BMI Body mass index is 29.72 kg/m. GEN: Well nourished, well developed, in no acute distress  HEENT: normal  Neck: no JVD, no masses. No carotid bruits, delayed carotid upstrokes Cardiac: RRR with 3/6 harsh late-peaking systolic murmur at the RUSB, A2 audible but diminished             Respiratory:  clear to auscultation bilaterally, normal work of breathing GI: soft, nontender, nondistended, + BS MS: no deformity or atrophy  Ext: no pretibial edema, pedal pulses 2+= bilaterally Skin: warm and dry, no rash Neuro:  Strength and sensation are intact Psych: euthymic mood, full affect  EKG:  EKG is ordered today. The ekg ordered today shows normal sinus rhythm 81 bpm, left axis deviation, minimal voltage criteria for LVH maybe normal variant.  Recent Labs: 04/27/2016: ALT 14; BUN 13; Creatinine, Ser 1.19; Hemoglobin 11.2; Platelets 199; Potassium 4.0; Sodium 135   Lipid Panel  No results found for: CHOL, TRIG, HDL, CHOLHDL, VLDL, LDLCALC, LDLDIRECT    Wt Readings from Last 3 Encounters:  02/15/17 184 lb 1.9 oz (83.5 kg)     Cardiac Studies Reviewed: 2-D echocardiogram is personally reviewed. Findings as outlined above in history of present illness.  STS RISK CALCULATOR: Procedure: AV Replacement  Risk of Mortality: 4.174%  Morbidity or Mortality: 20.274%  Long Length of Stay: 9.268%  Short Length of Stay: 22.262%  Permanent Stroke: 2.375%  Prolonged Ventilation: 13.51%  DSW Infection: 0.187%  Renal Failure: 5.294%  Reoperation: 7.745%   ASSESSMENT AND PLAN: 83 yo woman with severe symptomatic aortic stenosis (Stage D). I have reviewed the natural history of aortic stenosis with the patient and their family members who are present today. We have discussed the limitations of medical therapy and the poor prognosis associated with symptomatic aortic stenosis. We have reviewed potential treatment options, including palliative medical therapy,  conventional surgical aortic valve replacement, and transcatheter aortic valve replacement. We discussed treatment options in the context of this patient's specific comorbid medical conditions.   She is at moderate risk of conventional aortic valve replacement. I think at her age of 83 years old with moderate risk of surgery based on STS predicted mortality risk of 4.2%, TAVR for might be a reasonable treatment option. I have personally reviewed her echo images which clearly demonstrate thickening, calcification, and leaflet restriction of the aortic valve. Doppler criteria are diagnostic of severe aortic stenosis with a peak velocity in excess of 4 m/s and a mean gradient in excess of 40 mmHg.  I reviewed the necessary evaluation for TAVR, including diagnostic right and left heart catheterization, gated cardiac CT, CTA of the chest/abdomen/pelvis, and formal cardiac surgical evaluation. In addition, the patient will need dental evaluation because of poor dentition with no recent dental care. She has concerns about cost as she has no dental coverage. Will refer her to  Dr. Lawana Chambers.  We discussed the TAVR surgery in detail today. She understands the associated risks and expectations for recovery.   Current medicines are reviewed with the patient today.  The patient does not have concerns regarding medicines.  Labs/ tests ordered today include:   Orders Placed This Encounter  Procedures  . CT CORONARY MORPH W/CTA COR W/SCORE W/CA W/CM &/OR WO/CM  . CT CORONARY FRACTIONAL FLOW RESERVE DATA PREP  . CT CORONARY FRACTIONAL FLOW RESERVE FLUID ANALYSIS  . CT ANGIO CHEST AORTA W/CM &/OR WO/CM  . CT Angio Abd/Pel w/ and/or w/o  . Basic Metabolic Panel (BMET)  . CBC  . INR/PT  . Ambulatory referral to Dentistry  . EKG 12-Lead    Disposition:   As above  Signed, Sherren Mocha, MD  02/15/2017 2:30 PM    [address], [place], [place]  [address] Phone: [phone]; Fax: [phone]

## 2017-02-16 LAB — BASIC METABOLIC PANEL
BUN/Creatinine Ratio: 15 (ref 12–28)
BUN: 13 mg/dL (ref 8–27)
CALCIUM: 9.2 mg/dL (ref 8.7–10.3)
CHLORIDE: 103 mmol/L (ref 96–106)
CO2: 25 mmol/L (ref 18–29)
Creatinine, Ser: 0.85 mg/dL (ref 0.57–1.00)
GFR calc Af Amer: 72 mL/min/{1.73_m2} (ref >59)
GFR calc non Af Amer: 63 mL/min/{1.73_m2} (ref >59)
GLUCOSE: 132 mg/dL — AB (ref 65–99)
Potassium: 5 mmol/L (ref 3.5–5.2)
Sodium: 143 mmol/L (ref 134–144)

## 2017-02-16 LAB — CBC
HEMATOCRIT: 35.8 % (ref 34.0–46.6)
HEMOGLOBIN: 11.4 g/dL (ref 11.1–15.9)
MCH: 30.2 pg (ref 26.6–33.0)
MCHC: 31.8 g/dL (ref 31.5–35.7)
MCV: 95 fL (ref 79–97)
Platelets: 276 10*3/uL (ref 150–379)
RBC: 3.78 x10E6/uL (ref 3.77–5.28)
RDW: 13.5 % (ref 12.3–15.4)
WBC: 8.6 10*3/uL (ref 3.4–10.8)

## 2017-02-16 LAB — PROTIME-INR
INR: 0.9 (ref 0.8–1.2)
PROTHROMBIN TIME: 10.1 s (ref 9.1–12.0)

## 2017-02-20 ENCOUNTER — Ambulatory Visit (HOSPITAL_COMMUNITY)
Admission: RE | Admit: 2017-02-20 | Discharge: 2017-02-20 | Disposition: A | Discharge status: Home / Self Care | Payer: Medicare Other | Source: Ambulatory Visit | Attending: Cardiovascular Disease | Admitting: Cardiovascular Disease

## 2017-02-20 ENCOUNTER — Encounter (HOSPITAL_COMMUNITY)
Admission: RE | Disposition: A | Discharge status: Home / Self Care | Payer: Self-pay | Source: Ambulatory Visit | Attending: Cardiovascular Disease

## 2017-02-20 DIAGNOSIS — F419 Anxiety disorder, unspecified: Secondary | ICD-10-CM | POA: Diagnosis not present

## 2017-02-20 DIAGNOSIS — I2510 Atherosclerotic heart disease of native coronary artery without angina pectoris: Secondary | ICD-10-CM | POA: Diagnosis not present

## 2017-02-20 DIAGNOSIS — E119 Type 2 diabetes mellitus without complications: Secondary | ICD-10-CM | POA: Diagnosis not present

## 2017-02-20 DIAGNOSIS — Z885 Allergy status to narcotic agent status: Secondary | ICD-10-CM | POA: Insufficient documentation

## 2017-02-20 DIAGNOSIS — I350 Nonrheumatic aortic (valve) stenosis: Secondary | ICD-10-CM | POA: Insufficient documentation

## 2017-02-20 DIAGNOSIS — Z8249 Family history of ischemic heart disease and other diseases of the circulatory system: Secondary | ICD-10-CM | POA: Diagnosis not present

## 2017-02-20 DIAGNOSIS — J449 Chronic obstructive pulmonary disease, unspecified: Secondary | ICD-10-CM | POA: Diagnosis not present

## 2017-02-20 DIAGNOSIS — M1990 Unspecified osteoarthritis, unspecified site: Secondary | ICD-10-CM | POA: Insufficient documentation

## 2017-02-20 DIAGNOSIS — Z880 Allergy status to penicillin: Secondary | ICD-10-CM | POA: Diagnosis not present

## 2017-02-20 DIAGNOSIS — E785 Hyperlipidemia, unspecified: Secondary | ICD-10-CM | POA: Insufficient documentation

## 2017-02-20 DIAGNOSIS — I700 Atherosclerosis of aorta: Secondary | ICD-10-CM | POA: Diagnosis not present

## 2017-02-20 DIAGNOSIS — Z91040 Latex allergy status: Secondary | ICD-10-CM | POA: Insufficient documentation

## 2017-02-20 DIAGNOSIS — D649 Anemia, unspecified: Secondary | ICD-10-CM | POA: Insufficient documentation

## 2017-02-20 DIAGNOSIS — F329 Major depressive disorder, single episode, unspecified: Secondary | ICD-10-CM | POA: Insufficient documentation

## 2017-02-20 HISTORY — PX: RIGHT/LEFT HEART CATH AND CORONARY ANGIOGRAPHY: CATH118266

## 2017-02-20 LAB — POCT I-STAT 3, VENOUS BLOOD GAS (G3P V)
BICARBONATE: 26.1 mmol/L (ref 20.0–28.0)
O2 Saturation: 70 %
PCO2 VEN: 49.7 mmHg (ref 44.0–60.0)
PH VEN: 7.328 (ref 7.250–7.430)
TCO2: 28 mmol/L (ref 0–100)
pO2, Ven: 40 mmHg (ref 32.0–45.0)

## 2017-02-20 LAB — POCT I-STAT 3, ART BLOOD GAS (G3+)
Bicarbonate: 26.2 mmol/L (ref 20.0–28.0)
O2 Saturation: 96 %
PH ART: 7.334 — AB (ref 7.350–7.450)
TCO2: 28 mmol/L (ref 0–100)
pCO2 arterial: 49.3 mmHg — ABNORMAL HIGH (ref 32.0–48.0)
pO2, Arterial: 93 mmHg (ref 83.0–108.0)

## 2017-02-20 LAB — GLUCOSE, CAPILLARY: Glucose-Capillary: 121 mg/dL — ABNORMAL HIGH (ref 65–99)

## 2017-02-20 SURGERY — RIGHT/LEFT HEART CATH AND CORONARY ANGIOGRAPHY
Anesthesia: LOCAL

## 2017-02-20 MED ORDER — MIDAZOLAM HCL 2 MG/2ML IJ SOLN
INTRAMUSCULAR | Status: AC
  Filled 2017-02-20: qty 2

## 2017-02-20 MED ORDER — SODIUM CHLORIDE 0.9% FLUSH: 3.0000 mL | INTRAVENOUS | Status: DC | PRN

## 2017-02-20 MED ORDER — HEPARIN (PORCINE) IN NACL 2-0.9 UNIT/ML-% IJ SOLN
INTRAMUSCULAR | Status: DC | PRN
  Administered 2017-02-20: 1000 mL

## 2017-02-20 MED ORDER — HEPARIN (PORCINE) IN NACL 2-0.9 UNIT/ML-% IJ SOLN
INTRAMUSCULAR | Status: AC
  Filled 2017-02-20: qty 1000

## 2017-02-20 MED ORDER — LABETALOL HCL 5 MG/ML IV SOLN
INTRAVENOUS | Status: DC | PRN
  Administered 2017-02-20: 10 mg via INTRAVENOUS

## 2017-02-20 MED ORDER — MIDAZOLAM HCL 2 MG/2ML IJ SOLN
INTRAMUSCULAR | Status: DC | PRN
  Administered 2017-02-20: 2 mg via INTRAVENOUS
  Administered 2017-02-20: 1 mg via INTRAVENOUS

## 2017-02-20 MED ORDER — SODIUM CHLORIDE 0.9 % WEIGHT BASED INFUSION
3.0000 mL/kg/h | INTRAVENOUS | Status: AC
  Administered 2017-02-20: 3 mL/kg/h via INTRAVENOUS

## 2017-02-20 MED ORDER — ASPIRIN 81 MG PO CHEW
81.0000 mg | CHEWABLE_TABLET | ORAL | Status: AC
  Administered 2017-02-20: 81 mg via ORAL

## 2017-02-20 MED ORDER — VERAPAMIL HCL 2.5 MG/ML IV SOLN
INTRAVENOUS | Status: AC
  Filled 2017-02-20: qty 2

## 2017-02-20 MED ORDER — HEPARIN SODIUM (PORCINE) 1000 UNIT/ML IJ SOLN
INTRAMUSCULAR | Status: AC
  Filled 2017-02-20: qty 1

## 2017-02-20 MED ORDER — SODIUM CHLORIDE 0.9% FLUSH: 3.0000 mL | Freq: Two times a day (BID) | INTRAVENOUS | Status: DC

## 2017-02-20 MED ORDER — NITROGLYCERIN 1 MG/10 ML FOR IR/CATH LAB
INTRA_ARTERIAL | Status: DC | PRN
  Administered 2017-02-20 (×2): 200 ug via INTRACORONARY

## 2017-02-20 MED ORDER — IOPAMIDOL (ISOVUE-370) INJECTION 76%
INTRAVENOUS | Status: DC | PRN
  Administered 2017-02-20: 110 mL via INTRA_ARTERIAL

## 2017-02-20 MED ORDER — SODIUM CHLORIDE 0.9 % IV SOLN: 250.0000 mL | INTRAVENOUS | Status: DC | PRN

## 2017-02-20 MED ORDER — LIDOCAINE HCL (PF) 1 % IJ SOLN
INTRAMUSCULAR | Status: DC | PRN
  Administered 2017-02-20: 3 mL
  Administered 2017-02-20: 1 mL

## 2017-02-20 MED ORDER — NITROGLYCERIN 1 MG/10 ML FOR IR/CATH LAB
INTRA_ARTERIAL | Status: AC
  Filled 2017-02-20: qty 10

## 2017-02-20 MED ORDER — ASPIRIN 81 MG PO CHEW
CHEWABLE_TABLET | ORAL | Status: AC
  Administered 2017-02-20: 81 mg via ORAL
  Filled 2017-02-20: qty 1

## 2017-02-20 MED ORDER — LABETALOL HCL 5 MG/ML IV SOLN: 10.0000 mg | INTRAVENOUS | Status: DC | PRN

## 2017-02-20 MED ORDER — VERAPAMIL HCL 2.5 MG/ML IV SOLN
INTRAVENOUS | Status: DC | PRN
  Administered 2017-02-20: 10 mL via INTRA_ARTERIAL

## 2017-02-20 MED ORDER — LIDOCAINE HCL 1 % IJ SOLN
INTRAMUSCULAR | Status: AC
  Filled 2017-02-20: qty 20

## 2017-02-20 MED ORDER — SODIUM CHLORIDE 0.9 % WEIGHT BASED INFUSION: 1.0000 mL/kg/h | INTRAVENOUS | Status: DC

## 2017-02-20 MED ORDER — ONDANSETRON HCL 4 MG/2ML IJ SOLN: 4.0000 mg | Freq: Four times a day (QID) | INTRAMUSCULAR | Status: DC | PRN

## 2017-02-20 MED ORDER — SODIUM CHLORIDE 0.9 % IV SOLN
250.0000 mL | INTRAVENOUS | Status: DC | PRN
Start: 2017-02-21 — End: 2017-02-20

## 2017-02-20 MED ORDER — LABETALOL HCL 5 MG/ML IV SOLN
INTRAVENOUS | Status: AC
  Filled 2017-02-20: qty 4

## 2017-02-20 MED ORDER — ACETAMINOPHEN 325 MG PO TABS: 650.0000 mg | ORAL_TABLET | ORAL | Status: DC | PRN

## 2017-02-20 MED ORDER — IOPAMIDOL (ISOVUE-370) INJECTION 76%
INTRAVENOUS | Status: AC
  Filled 2017-02-20: qty 100

## 2017-02-20 MED ORDER — HYDRALAZINE HCL 20 MG/ML IJ SOLN: 5.0000 mg | Freq: Two times a day (BID) | INTRAMUSCULAR | Status: DC | PRN

## 2017-02-20 MED ORDER — HEPARIN SODIUM (PORCINE) 1000 UNIT/ML IJ SOLN
INTRAMUSCULAR | Status: DC | PRN
  Administered 2017-02-20: 4000 [IU] via INTRAVENOUS

## 2017-02-20 MED ORDER — FENTANYL CITRATE (PF) 100 MCG/2ML IJ SOLN
INTRAMUSCULAR | Status: DC | PRN
  Administered 2017-02-20 (×2): 25 ug via INTRAVENOUS

## 2017-02-20 MED ORDER — FENTANYL CITRATE (PF) 100 MCG/2ML IJ SOLN
INTRAMUSCULAR | Status: AC
  Filled 2017-02-20: qty 2

## 2017-02-20 SURGICAL SUPPLY — 17 items
CATH 5FR JL3.5 JR4 ANG PIG MP (CATHETERS) ×1 IMPLANT
CATH BALLN WEDGE 5F 110CM (CATHETERS) ×1 IMPLANT
CATH INFINITI 5FR AL1 (CATHETERS) ×1 IMPLANT
CATH INFINITI MULTIPACK ANG 4F (CATHETERS) ×1 IMPLANT
DEVICE RAD COMP TR BAND LRG (VASCULAR PRODUCTS) ×1 IMPLANT
GLIDESHEATH SLEND SS 6F .021 (SHEATH) ×1 IMPLANT
GUIDEWIRE .025 260CM (WIRE) ×1 IMPLANT
GUIDEWIRE INQWIRE 1.5J.035X260 (WIRE) IMPLANT
INQWIRE 1.5J .035X260CM (WIRE) ×2
KIT HEART LEFT (KITS) ×2 IMPLANT
PACK CARDIAC CATHETERIZATION (CUSTOM PROCEDURE TRAY) ×2 IMPLANT
SHEATH GLIDE SLENDER 4/5FR (SHEATH) ×1 IMPLANT
SYR MEDRAD MARK V 150ML (SYRINGE) ×2 IMPLANT
TRANSDUCER W/STOPCOCK (MISCELLANEOUS) ×2 IMPLANT
TUBING CIL FLEX 10 FLL-RA (TUBING) ×2 IMPLANT
WIRE EMERALD ST .035X260CM (WIRE) ×1 IMPLANT
WIRE HI TORQ VERSACORE-J 145CM (WIRE) ×1 IMPLANT

## 2017-02-20 NOTE — Interval H&P Note (Signed)
History and Physical Interval Note:  02/20/2017 2:03 PM  Amy Kaiser  has presented today for surgery, with the diagnosis of as  The various methods of treatment have been discussed with the patient and family. After consideration of risks, benefits and other options for treatment, the patient has consented to  Procedure(s): Right/Left Heart Cath and Coronary Angiography (N/A) as a surgical intervention .  The patient's history has been reviewed, patient examined, no change in status, stable for surgery.  I have reviewed the patient's chart and labs.  Questions were answered to the patient's satisfaction.     Sherren Mocha

## 2017-02-20 NOTE — H&P (View-Only) (Signed)
Cardiology Office Note Date:  02/15/2017   ID:  Amy Kaiser, DOB May 08, 1929, MRN [id number]  PCP:  Vidal Schwalbe, MD  Cardiologist:  Dr Wynonia Lawman  Chief Complaint  Patient presents with  . TAVR consult     History of Present Illness: Amy Kaiser is a 83 y.o. female who presents for evaluation of severe aortic stenosis, referred by Dr Wynonia Lawman.  The patient is here today with her son, daughter, and grandson. She lives independently and her family members live nearby.  The patient was diagnosed with a heart murmur about one year ago and was referred to Dr. Wynonia Lawman for evaluation. She was noted to have moderate aortic stenosis at the time. She recently returned for annual follow-up and repeat echocardiogram demonstrated progression of her aortic stenosis, now severe with a peak systolic velocity of 6.71 m/s, mean gradient of 43 mmHg, and peak transaortic valve gradient of 72 mmHg. Her LV function is normal with an ejection fraction of 65%. She has no other significant valvular disease.  The patient has had asthma for over 25 years. She describes the severity of her asthma is mild. She's never been hospitalized. She uses an inhaler periodically. Over the last 6 months she has developed progressive dyspnea with exertion. She also describes chest tightness when walking up an incline. This resolves with rest. She's had no lightheadedness or presyncope associated with physical exertion. However, she's had a few spells where she feels pain without warning but has not experienced frank syncope. She denies orthopnea, PND, or leg swelling.  The patient is functionally independent. She no longer drives a car because of her occasional lightheaded spells. She's been retired for approximately 20 years. She still gets out and does some light work in the yard.   Past Medical History:  Diagnosis Date  . Anemia   . Anxiety   . Arthritis   . Asthma   . Cancer ([place])   . Cervical cancer ([place])   .  COPD (chronic obstructive pulmonary disease) ([place])   . Depression   . Diabetes mellitus without complication ([place])   . Hyperlipidemia     Past Surgical History:  Procedure Laterality Date  . ABDOMINAL HYSTERECTOMY    . APPENDECTOMY    . CATARACT EXTRACTION      Current Outpatient Prescriptions  Medication Sig Dispense Refill  . albuterol (ACCUNEB) 0.63 MG/3ML nebulizer solution Take 1 ampule by nebulization every 6 (six) hours as needed for wheezing.    . calcium-vitamin D (OSCAL WITH D) 500-200 MG-UNIT tablet Take 1 tablet by mouth 2 (two) times daily.    . Cholecalciferol (VITAMIN D3 PO) Take 1 tablet by mouth daily.    . CRESTOR 10 MG tablet Take 10 mg by mouth every evening.  12  . FLOVENT HFA 220 MCG/ACT inhaler Take 1 puff by mouth at bedtime as needed. sleep  12  . LORazepam (ATIVAN) 0.5 MG tablet Take 0.5 mg by mouth at bedtime as needed. sleep  2  . Multiple Vitamins-Minerals (ICAPS AREDS 2 PO) Take 1 capsule by mouth 2 (two) times daily.    . VENTOLIN HFA 108 (90 BASE) MCG/ACT inhaler Inhale 2 puffs into the lungs every 4 (four) hours as needed. Wheezing  2   No current facility-administered medications for this visit.     Allergies:   Alendronate sodium; Amoxicillin; Codeine; Latex; Lisinopril; Morphine and related; Penicillins; Pravachol [pravastatin sodium]; Prozac [fluoxetine hcl]; Symbicort [budesonide-formoterol fumarate]; and Wellbutrin [bupropion]   Social History:  The patient  reports that she has never smoked. She has never used smokeless tobacco. She reports that she does not drink alcohol or use drugs.   Family History:  Father died of an MI, brother had 'bad heart'   ROS:  Please see the history of present illness.  Otherwise, review of systems is positive for excessive fatigue, wheezing, anxiety, balance problems, cough.  All other systems are reviewed and negative.    PHYSICAL EXAM: VS:  BP (!) 156/80   Pulse 80   Ht 5\' 6"  (1.676 m)   Wt 184 lb 1.9 oz  (83.5 kg)   BMI 29.72 kg/m  , BMI Body mass index is 29.72 kg/m. GEN: Well nourished, well developed, in no acute distress  HEENT: normal  Neck: no JVD, no masses. No carotid bruits, delayed carotid upstrokes Cardiac: RRR with 3/6 harsh late-peaking systolic murmur at the RUSB, A2 audible but diminished             Respiratory:  clear to auscultation bilaterally, normal work of breathing GI: soft, nontender, nondistended, + BS MS: no deformity or atrophy  Ext: no pretibial edema, pedal pulses 2+= bilaterally Skin: warm and dry, no rash Neuro:  Strength and sensation are intact Psych: euthymic mood, full affect  EKG:  EKG is ordered today. The ekg ordered today shows normal sinus rhythm 81 bpm, left axis deviation, minimal voltage criteria for LVH maybe normal variant.  Recent Labs: 04/27/2016: ALT 14; BUN 13; Creatinine, Ser 1.19; Hemoglobin 11.2; Platelets 199; Potassium 4.0; Sodium 135   Lipid Panel  No results found for: CHOL, TRIG, HDL, CHOLHDL, VLDL, LDLCALC, LDLDIRECT    Wt Readings from Last 3 Encounters:  02/15/17 184 lb 1.9 oz (83.5 kg)     Cardiac Studies Reviewed: 2-D echocardiogram is personally reviewed. Findings as outlined above in history of present illness.  STS RISK CALCULATOR: Procedure: AV Replacement  Risk of Mortality: 4.174%  Morbidity or Mortality: 20.274%  Long Length of Stay: 9.268%  Short Length of Stay: 22.262%  Permanent Stroke: 2.375%  Prolonged Ventilation: 13.51%  DSW Infection: 0.187%  Renal Failure: 5.294%  Reoperation: 7.745%   ASSESSMENT AND PLAN: 83 yo woman with severe symptomatic aortic stenosis (Stage D). I have reviewed the natural history of aortic stenosis with the patient and their family members who are present today. We have discussed the limitations of medical therapy and the poor prognosis associated with symptomatic aortic stenosis. We have reviewed potential treatment options, including palliative medical therapy,  conventional surgical aortic valve replacement, and transcatheter aortic valve replacement. We discussed treatment options in the context of this patient's specific comorbid medical conditions.   She is at moderate risk of conventional aortic valve replacement. I think at her age of 83 years old with moderate risk of surgery based on STS predicted mortality risk of 4.2%, TAVR for might be a reasonable treatment option. I have personally reviewed her echo images which clearly demonstrate thickening, calcification, and leaflet restriction of the aortic valve. Doppler criteria are diagnostic of severe aortic stenosis with a peak velocity in excess of 4 m/s and a mean gradient in excess of 40 mmHg.  I reviewed the necessary evaluation for TAVR, including diagnostic right and left heart catheterization, gated cardiac CT, CTA of the chest/abdomen/pelvis, and formal cardiac surgical evaluation. In addition, the patient will need dental evaluation because of poor dentition with no recent dental care. She has concerns about cost as she has no dental coverage. Will refer her to  Dr. Lawana Chambers.  We discussed the TAVR surgery in detail today. She understands the associated risks and expectations for recovery.   Current medicines are reviewed with the patient today.  The patient does not have concerns regarding medicines.  Labs/ tests ordered today include:   Orders Placed This Encounter  Procedures  . CT CORONARY MORPH W/CTA COR W/SCORE W/CA W/CM &/OR WO/CM  . CT CORONARY FRACTIONAL FLOW RESERVE DATA PREP  . CT CORONARY FRACTIONAL FLOW RESERVE FLUID ANALYSIS  . CT ANGIO CHEST AORTA W/CM &/OR WO/CM  . CT Angio Abd/Pel w/ and/or w/o  . Basic Metabolic Panel (BMET)  . CBC  . INR/PT  . Ambulatory referral to Dentistry  . EKG 12-Lead    Disposition:   As above  Signed, Sherren Mocha, MD  02/15/2017 2:30 PM    [address], [place], [place]  [address] Phone: [phone]; Fax: [phone]

## 2017-02-20 NOTE — Discharge Instructions (Signed)
Radial Site Care °Refer to this sheet in the next few weeks. These instructions provide you with information about caring for yourself after your procedure. Your health care provider may also give you more specific instructions. Your treatment has been planned according to current medical practices, but problems sometimes occur. Call your health care provider if you have any problems or questions after your procedure. °What can I expect after the procedure? °After your procedure, it is typical to have the following: °· Bruising at the radial site that usually fades within 1-2 weeks. °· Blood collecting in the tissue (hematoma) that may be painful to the touch. It should usually decrease in size and tenderness within 1-2 weeks. °Follow these instructions at home: °· Take medicines only as directed by your health care provider. °· You may shower 24-48 hours after the procedure or as directed by your health care provider. Remove the bandage (dressing) and gently wash the site with plain soap and water. Pat the area dry with a clean towel. Do not rub the site, because this may cause bleeding. °· Do not take baths, swim, or use a hot tub until your health care provider approves. °· Check your insertion site every day for redness, swelling, or drainage. °· Do not apply powder or lotion to the site. °· Do not flex or bend the affected arm for 24 hours or as directed by your health care provider. °· Do not push or pull heavy objects with the affected arm for 24 hours or as directed by your health care provider. °· Do not lift over 10 lb (4.5 kg) for 5 days after your procedure or as directed by your health care provider. °· Ask your health care provider when it is okay to: °¨ Return to work or school. °¨ Resume usual physical activities or sports. °¨ Resume sexual activity. °· Do not drive home if you are discharged the same day as the procedure. Have someone else drive you. °· You may drive 24 hours after the procedure  unless otherwise instructed by your health care provider. °· Do not operate machinery or power tools for 24 hours after the procedure. °· If your procedure was done as an outpatient procedure, which means that you went home the same day as your procedure, a responsible adult should be with you for the first 24 hours after you arrive home. °· Keep all follow-up visits as directed by your health care provider. This is important. °Contact a health care provider if: °· You have a fever. °· You have chills. °· You have increased bleeding from the radial site. Hold pressure on the site. CALL 911 °Get help right away if: °· You have unusual pain at the radial site. °· You have redness, warmth, or swelling at the radial site. °· You have drainage (other than a small amount of blood on the dressing) from the radial site. °· The radial site is bleeding, and the bleeding does not stop after 30 minutes of holding steady pressure on the site. °· Your arm or hand becomes pale, cool, tingly, or numb. °This information is not intended to replace advice given to you by your health care provider. Make sure you discuss any questions you have with your health care provider. °Document Released: 11/24/2010 Document Revised: 03/29/2016 Document Reviewed: 05/10/2014 °Elsevier Interactive Patient Education © 2017 [employer] ° ° °

## 2017-02-21 ENCOUNTER — Other Ambulatory Visit (HOSPITAL_COMMUNITY): Payer: Self-pay | Admitting: Dentistry

## 2017-02-21 ENCOUNTER — Encounter (HOSPITAL_COMMUNITY): Payer: Self-pay | Admitting: Cardiovascular Disease

## 2017-02-25 ENCOUNTER — Encounter (HOSPITAL_COMMUNITY): Payer: Self-pay | Admitting: Dentistry

## 2017-02-25 ENCOUNTER — Ambulatory Visit (HOSPITAL_COMMUNITY): Payer: Self-pay | Admitting: Dentistry

## 2017-02-25 VITALS — BP 167/67 | HR 93 | Temp 97.8°F

## 2017-02-25 DIAGNOSIS — K036 Deposits [accretions] on teeth: Secondary | ICD-10-CM

## 2017-02-25 DIAGNOSIS — K029 Dental caries, unspecified: Secondary | ICD-10-CM

## 2017-02-25 DIAGNOSIS — I350 Nonrheumatic aortic (valve) stenosis: Secondary | ICD-10-CM

## 2017-02-25 DIAGNOSIS — K0530 Chronic periodontitis, unspecified: Secondary | ICD-10-CM

## 2017-02-25 DIAGNOSIS — K06020 Generalized gingival recession, unspecified: Secondary | ICD-10-CM

## 2017-02-25 DIAGNOSIS — K08409 Partial loss of teeth, unspecified cause, unspecified class: Secondary | ICD-10-CM

## 2017-02-25 DIAGNOSIS — Z01810 Encounter for preprocedural cardiovascular examination: Secondary | ICD-10-CM | POA: Diagnosis not present

## 2017-02-25 DIAGNOSIS — K045 Chronic apical periodontitis: Secondary | ICD-10-CM

## 2017-02-25 DIAGNOSIS — K0889 Other specified disorders of teeth and supporting structures: Secondary | ICD-10-CM

## 2017-02-25 DIAGNOSIS — M264 Malocclusion, unspecified: Secondary | ICD-10-CM

## 2017-02-25 MED ORDER — CLINDAMYCIN HCL 150 MG PO CAPS: ORAL_CAPSULE | ORAL | 2 refills | Status: DC

## 2017-02-25 NOTE — Patient Instructions (Signed)
[facility]    Department of Dental Medicine     DR. Hilario Robarts      HEART VALVES AND MOUTH CARE:  FACTS:   If you have any infection in your mouth, it can infect your heart valve.  If you heart valve is infected, you will be seriously ill.  Infections in the mouth can be SILENT and do not always cause pain.  Examples of infections in the mouth are gum disease, dental cavities, and abscesses.  Some possible signs of infection are: Bad breath, bleeding gums, or teeth that are sensitive to sweets, hot, and/or cold. There are many other signs as well.  WHAT YOU HAVE TO DO:   Brush your teeth after meals and at bedtime. Spend at least 2 minutes brushing well, especially behind your back teeth and all around your teeth that stand alone. Brush at the gumline also.  Do not go to bed without brushing your teeth and flossing.  If you gums bleed when you brush or floss, do NOT stop brushing or flossing. It usually means that your gums need more attention and better cleaning.   If your Dentist or Dr. Garion Wempe gave you a prescription mouthwash to use, make sure to use it as directed. If you run out of the medication, get a refill at the pharmacy.   If you were given any other medications or directions by your Dentist, please follow them. If you did not understand the directions or forget what you were told, please call. We will be happy to refresh her memory.  If you need antibiotics before dental procedures, make sure you take them one hour prior to every dental visit as directed.   Get a dental checkup every 4-6 months in order to keep your mouth healthy, or to find and treat any new infection. You will most likely need your teeth cleaned or gums treated at the same time.  If you are not able to come in for your scheduled appointment, call your Dentist as soon as possible to reschedule.  If you have a problem in between dental visits, call your Dentist.  

## 2017-02-26 ENCOUNTER — Ambulatory Visit (HOSPITAL_COMMUNITY)
Admission: RE | Admit: 2017-02-26 | Discharge: 2017-02-26 | Disposition: A | Discharge status: Home / Self Care | Payer: Medicare Other | Source: Ambulatory Visit | Attending: Cardiovascular Disease | Admitting: Cardiovascular Disease

## 2017-02-26 ENCOUNTER — Ambulatory Visit (HOSPITAL_COMMUNITY): Payer: Medicare Other

## 2017-02-26 ENCOUNTER — Other Ambulatory Visit (HOSPITAL_COMMUNITY): Payer: Self-pay | Admitting: Cardiology

## 2017-02-26 ENCOUNTER — Ambulatory Visit (HOSPITAL_COMMUNITY)
Admission: RE | Admit: 2017-02-26 | Discharge: 2017-02-26 | Disposition: A | Discharge status: Home / Self Care | Payer: Medicare Other | Source: Ambulatory Visit | Attending: Cardiology | Admitting: Cardiology

## 2017-02-26 ENCOUNTER — Ambulatory Visit: Payer: Medicare Other | Attending: Cardiovascular Disease | Admitting: Physical Therapy

## 2017-02-26 ENCOUNTER — Encounter (HOSPITAL_COMMUNITY): Payer: Self-pay

## 2017-02-26 ENCOUNTER — Encounter: Payer: Self-pay | Admitting: Physical Therapy

## 2017-02-26 DIAGNOSIS — K449 Diaphragmatic hernia without obstruction or gangrene: Secondary | ICD-10-CM | POA: Insufficient documentation

## 2017-02-26 DIAGNOSIS — R918 Other nonspecific abnormal finding of lung field: Secondary | ICD-10-CM | POA: Diagnosis not present

## 2017-02-26 DIAGNOSIS — I700 Atherosclerosis of aorta: Secondary | ICD-10-CM | POA: Diagnosis not present

## 2017-02-26 DIAGNOSIS — I350 Nonrheumatic aortic (valve) stenosis: Secondary | ICD-10-CM

## 2017-02-26 DIAGNOSIS — K5730 Diverticulosis of large intestine without perforation or abscess without bleeding: Secondary | ICD-10-CM | POA: Insufficient documentation

## 2017-02-26 DIAGNOSIS — R931 Abnormal findings on diagnostic imaging of heart and coronary circulation: Secondary | ICD-10-CM

## 2017-02-26 DIAGNOSIS — R293 Abnormal posture: Secondary | ICD-10-CM | POA: Diagnosis present

## 2017-02-26 DIAGNOSIS — R2689 Other abnormalities of gait and mobility: Secondary | ICD-10-CM

## 2017-02-26 DIAGNOSIS — I2510 Atherosclerotic heart disease of native coronary artery without angina pectoris: Secondary | ICD-10-CM | POA: Diagnosis not present

## 2017-02-26 DIAGNOSIS — I2584 Coronary atherosclerosis due to calcified coronary lesion: Secondary | ICD-10-CM | POA: Diagnosis not present

## 2017-02-26 HISTORY — PX: IR RADIOLOGY PERIPHERAL GUIDED IV START: IMG5598

## 2017-02-26 HISTORY — PX: IR US GUIDE VASC ACCESS RIGHT: IMG2390

## 2017-02-26 MED ORDER — LIDOCAINE HCL 1 % IJ SOLN
INTRAMUSCULAR | Status: DC | PRN
  Administered 2017-02-26: 5 mL

## 2017-02-26 MED ORDER — NITROGLYCERIN 0.4 MG SL SUBL
0.4000 mg | SUBLINGUAL_TABLET | Freq: Once | SUBLINGUAL | Status: AC
  Administered 2017-02-26: 0.8 mg via SUBLINGUAL

## 2017-02-26 MED ORDER — NITROGLYCERIN 0.4 MG SL SUBL
SUBLINGUAL_TABLET | SUBLINGUAL | Status: AC
  Filled 2017-02-26: qty 1

## 2017-02-26 MED ORDER — IOPAMIDOL (ISOVUE-370) INJECTION 76%
INTRAVENOUS | Status: AC
  Administered 2017-02-26: 50 mL
  Filled 2017-02-26: qty 50

## 2017-02-26 MED ORDER — IOPAMIDOL (ISOVUE-370) INJECTION 76%
INTRAVENOUS | Status: AC
  Administered 2017-02-26: 100 mL
  Filled 2017-02-26: qty 100

## 2017-02-26 MED ORDER — METOPROLOL TARTRATE 5 MG/5ML IV SOLN
INTRAVENOUS | Status: AC
  Filled 2017-02-26: qty 20

## 2017-02-26 MED ORDER — METOPROLOL TARTRATE 5 MG/5ML IV SOLN
5.0000 mg | INTRAVENOUS | Status: DC | PRN
  Administered 2017-02-26 (×4): 5 mg via INTRAVENOUS

## 2017-02-26 MED ORDER — LIDOCAINE HCL 1 % IJ SOLN
INTRAMUSCULAR | Status: AC
  Filled 2017-02-26: qty 20

## 2017-02-26 MED ORDER — NITROGLYCERIN 0.4 MG SL SUBL
SUBLINGUAL_TABLET | SUBLINGUAL | Status: AC
Start: 2017-02-26 — End: 2017-02-26
  Filled 2017-02-26: qty 1

## 2017-02-26 NOTE — Therapy (Signed)
[place], [place], [address] Phone: [phone]   Fax:  [phone]  Physical Therapy Evaluation  Patient Details  Name: Amy Kaiser MRN: [id number] Date of Birth: 1929/05/14 Referring Provider: Dr. Sherren Mocha  Encounter Date: 02/26/2017      PT End of Session - 02/26/17 1740    Visit Number 1   PT Start Time 3664   PT Stop Time 1515   PT Time Calculation (min) 60 min      Past Medical History:  Diagnosis Date  . Anemia   . Anxiety   . Arthritis   . Asthma   . Cancer ([place])   . Cervical cancer ([place])   . COPD (chronic obstructive pulmonary disease) ([place])   . Depression   . Diabetes mellitus without complication ([place])   . Hyperlipidemia     Past Surgical History:  Procedure Laterality Date  . ABDOMINAL HYSTERECTOMY    . APPENDECTOMY    . CATARACT EXTRACTION    . IR RADIOLOGY PERIPHERAL GUIDED IV START  02/26/2017  . IR US GUIDE VASC ACCESS RIGHT  02/26/2017  . RIGHT/LEFT HEART CATH AND CORONARY ANGIOGRAPHY N/A 02/20/2017   Procedure: Right/Left Heart Cath and Coronary Angiography;  Surgeon: Sherren Mocha, MD;  Location: [facility];  Service: Cardiovascular;  Laterality: N/A;    There were no vitals filed for this visit.       Subjective Assessment - 02/26/17 1421    Subjective Pt and daughter report shortness of breath x 6 months and some chest tightness with walking up inclinces.    Patient Stated Goals fix heart   Currently in Pain? No/denies            [facility] PT Assessment - 02/26/17 0001      Assessment   Medical Diagnosis severe aortic stenosis   Referring Provider Dr. Sherren Mocha   Onset Date/Surgical Date 08/28/16  approximate     Precautions   Precautions None     Restrictions   Weight Bearing Restrictions No     Balance Screen   Has the patient fallen in the past 6 months No   Has the patient had a decrease in activity level because of a fear  of falling?  No   Is the patient reluctant to leave their home because of a fear of falling?  No     Home Environment   Living Environment Private residence   Home Access Stairs to enter   Entrance Stairs-Number of Steps 2   Entrance Stairs-Rails Right;Left;Cannot reach both   [place] level     Prior Function   Level of Independence Independent     Posture/Postural Control   Posture/Postural Control Postural limitations   Postural Limitations Rounded Shoulders;Forward head  mild     ROM / Strength   AROM / PROM / Strength AROM;Strength     AROM   Overall AROM Comments grossly WNL     Strength   Overall Strength Comments grossly 4/5 throughout   Strength Assessment Site Hand   Right/Left hand Right;Left   Right Hand Grip (lbs) 40  R hand dominant   Left Hand Grip (lbs) 30     Ambulation/Gait   Gait Comments Pt demonstrates some mild LOB during ambulation especially with changes in directions although pt reports it was due to not wearing her normal shoes today. Balance testing did not indicate increased risk of falling. Pt's gait distance limited by 45%  for her age/gender due to shortness of breath/fatigue.           [facility] Pre-Surgical Assessment - 03/25/2017 0001    5 Meter Walk Test- trial 1 5 sec   5 Meter Walk Test- trial 2 7 sec.    5 Meter Walk Test- trial 3 6 sec.   5 meter walk test average 6 sec   4 Stage Balance Test tolerated for:  2 sec.   4 Stage Balance Test Position 4   Sit To Stand Test- trial 1 27 sec.   ADL/IADL Independent with: Bathing;Dressing;Meal prep;Finances   ADL/IADL Needs Assistance with: [employer] Le[employer] work   ADL/IADL Fraility Index Vulnerable   6 Minute Walk- Baseline yes   BP (mmHg) 199/88  160 78 taken manually versus automatic   HR (bpm) 66   02 Sat (%RA) 93 %   Modified Borg Scale for Dyspnea 0- Nothing at all   Perceived Rate of Exertion (Borg) 6-   6 Minute Walk Post Test yes   BP (mmHg) 172/88  manual   HR (bpm) 90   02 Sat  (%RA) 95 %   Modified Borg Scale for Dyspnea 1- Very mild shortness of breath   Perceived Rate of Exertion (Borg) 11- Fairly light   Aerobic Endurance Distance Walked 695   Endurance additional comments Pt required 1 rest break at 2:00 (280') lasting 45 seconds due to shortness of breath & fatigue. Pt able to resume after that rest and ambulate for the remainder of the 6 minutes.                                      Plan - Mar 25, 2017 1740    Clinical Impression Statement see below   PT Frequency One time visit   Consulted and Agree with Plan of Care Patient     Clinical Impression Statement: Pt is a 83 yo female presenting to OP PT for evaluation prior to possible TAVR surgery due to severe aortic stenosis. Pt reports onset of exertional dyspnea and chest tightness approximately 6 months ago. Pt presents with good ROM and strength, good balance and is not at high fall risk 4 stage balance test, good walking speed and fair to poor aerobic endurance per 6 minute walk test. Pt ambulated 280 feet in 2:00 before requesting a seated rest beak lasting 45 seconds. Pt able to resume after rest and ambulate the remainder of the 6 minutes. Pt ambulated a total of 695 feet in 6 minute walk. Based on the Short Physical Performance Battery, patient has a frailty rating of 9/12 with </= 5/12 considered frail.   Patient demonstrated the following deficits and impairments:     Visit Diagnosis: Other abnormalities of gait and mobility  Abnormal posture      G-Codes - Mar 25, 2017 1741    Functional Assessment Tool Used (Outpatient Only) 6 minute walk 695'   Functional Limitation Mobility: Walking and moving around   Mobility: Walking and Moving Around Current Status [phone]) At least 40 percent but less than 60 percent impaired, limited or restricted   Mobility: Walking and Moving Around Goal Status [phone]) At least 40 percent but less than 60 percent impaired, limited or restricted    Mobility: Walking and Moving Around Discharge Status [phone]) At least 40 percent but less than 60 percent impaired, limited or restricted       Problem List Patient Active Problem List  Diagnosis Date Noted  . Severe aortic stenosis 02/20/2017    [place], PT 02/26/2017, 5:45 PM  [place], [place], [address] Phone: [phone]   Fax:  [phone]  Name: Amy Kaiser MRN: [id number] Date of Birth: [birth year]

## 2017-02-26 NOTE — Progress Notes (Signed)
DENTAL CONSULTATION  Date of Consultation:  February 25, 2017 Patient Name:   Amy Kaiser Date of Birth:   1929/04/17 Medical Record Number: [id number]  VITALS: BP (!) 167/67 (BP Location: Left Arm)   Pulse 93   Temp 97.8 F (36.6 C) (Oral)   CHIEF COMPLAINT: Patient referred by Dr. Burt Knack for dental consultation.  HPI: Amy Kaiser is an 83 year old female referred by Dr. Burt Knack for dental consultation. Patient recently diagnosed with severe aortic stenosis. Patient with anticipated TAVR procedure. Patient is now seen as part of a medically necessary pre-heart valve surgery dental protocol examination.  The patient currently denies acute toothaches, swellings, or abscesses. Patient does complain of a loose mandibular anterior tooth (#25). Patient last saw a dentist at least 10 years ago. This was for fabrication of upper complete and lower partial dentures. Patient was unable to wear the dentures and currently is wearing the pre-existing upper complete denture that she wore prior to fabrication of the new dentures. This upper complete denture is at least 75+ years old. The patient's denies having any problems with this denture.  The patient cannot remember the name of the dentist that she last saw. The patient denies having dental phobia.  PROBLEM LIST: Patient Active Problem List   Diagnosis Date Noted  . Severe aortic stenosis 02/20/2017    Priority: High    PMH: Past Medical History:  Diagnosis Date  . Anemia   . Anxiety   . Arthritis   . Asthma   . Cancer ([place])   . Cervical cancer ([place])   . COPD (chronic obstructive pulmonary disease) ([place])   . Depression   . Diabetes mellitus without complication ([place])   . Hyperlipidemia     PSH: Past Surgical History:  Procedure Laterality Date  . ABDOMINAL HYSTERECTOMY    . APPENDECTOMY    . CATARACT EXTRACTION    . RIGHT/LEFT HEART CATH AND CORONARY ANGIOGRAPHY N/A 02/20/2017   Procedure: Right/Left Heart Cath and  Coronary Angiography;  Surgeon: Sherren Mocha, MD;  Location: [facility];  Service: Cardiovascular;  Laterality: N/A;    ALLERGIES: Allergies  Allergen Reactions  . Alendronate Sodium     Doesn't recall   . Codeine Nausea And Vomiting  . Latex Hives and Itching    Caused blisters in her mouth  . Lisinopril     Weakness   . Morphine And Related Other (See Comments)    unknown  . Penicillins Other (See Comments)    Intolerance to ALL "CILLINS" Has patient had a PCN reaction causing immediate rash, facial/tongue/throat swelling, SOB or lightheadedness with hypotension: unknown Has patient had a PCN reaction causing severe rash involving mucus membranes or skin necrosis: unknown Has patient had a PCN reaction that required hospitalization unknown Has patient had a PCN reaction occurring within the last 10 years: unknown If all of the above answers are "NO", then may proceed with Cephalosporin use.   Christy Sartorius Sodium] Other (See Comments)    Leg pain   . Prozac [Fluoxetine Hcl]     Feels shaky   . Symbicort [Budesonide-Formoterol Fumarate]     shakey   . Wellbutrin [Bupropion]     Jittery     MEDICATIONS: Current Outpatient Prescriptions  Medication Sig Dispense Refill  . calcium-vitamin D (OSCAL WITH D) 500-200 MG-UNIT tablet Take 1 tablet by mouth 2 (two) times daily.    . Cholecalciferol (VITAMIN D3) 1000 units CAPS Take 1,000 Units by mouth daily.    Marland Kitchen  FLOVENT HFA 220 MCG/ACT inhaler Take 1 puff by mouth at bedtime as needed (wheezing). sleep  12  . Multiple Vitamins-Minerals (ICAPS AREDS 2 PO) Take 1 capsule by mouth 2 (two) times daily.    . rosuvastatin (CRESTOR) 10 MG tablet Take 10 mg by mouth every evening.    . VENTOLIN HFA 108 (90 BASE) MCG/ACT inhaler Inhale 2 puffs into the lungs 2 (two) times daily as needed for wheezing or shortness of breath.   2  . clindamycin (CLEOCIN) 150 MG capsule Take four (4) capsules one hour prior to dental  appointment. (Patient not taking: Reported on 02/26/2017) 4 capsule 2  . LORazepam (ATIVAN) 0.5 MG tablet Take 0.5 mg by mouth at bedtime as needed for sleep.   2   No current facility-administered medications for this visit.    Facility-Administered Medications Ordered in Other Visits  Medication Dose Route Frequency Provider Last Rate Last Dose  . iopamidol (ISOVUE-370) 76 % injection           . iopamidol (ISOVUE-370) 76 % injection             LABS: Lab Results  Component Value Date   WBC 8.6 02/15/2017   HGB 11.2 (L) 04/27/2016   HCT 35.8 02/15/2017   MCV 95 02/15/2017   PLT 276 02/15/2017      Component Value Date/Time   NA 143 02/15/2017 1404   K 5.0 02/15/2017 1404   CL 103 02/15/2017 1404   CO2 25 02/15/2017 1404   GLUCOSE 132 (H) 02/15/2017 1404   GLUCOSE 125 (H) 04/27/2016 0805   BUN 13 02/15/2017 1404   CREATININE 0.85 02/15/2017 1404   CALCIUM 9.2 02/15/2017 1404   GFRNONAA 63 02/15/2017 1404   GFRAA 72 02/15/2017 1404   Lab Results  Component Value Date   INR 0.9 02/15/2017   No results found for: PTT  SOCIAL HISTORY: Social History   Social History  . Marital status: Widowed    Spouse name: N/A  . Number of children: 3  . Years of education: N/A   Occupational History  . Not on file.   Social History Main Topics  . Smoking status: Never Smoker  . Smokeless tobacco: Never Used  . Alcohol use No  . Drug use: No  . Sexual activity: Not on file   Other Topics Concern  . Not on file   Social History Narrative  . No narrative on file    FAMILY HISTORY: No family history on file.  REVIEW OF SYSTEMS: Reviewed With the patient as per history of present illness.  Psych: Patient denies having dental phobia.  DENTAL HISTORY: CHIEF COMPLAINT: Patient referred by Dr. Burt Knack for dental consultation.  HPI: Amy Kaiser is an 83 year old female referred by Dr. Burt Knack for dental consultation. Patient recently diagnosed with severe aortic  stenosis. Patient with anticipated TAVR procedure. Patient is now seen as part of a medically necessary pre-heart valve surgery dental protocol examination.  The patient currently denies acute toothaches, swellings, or abscesses. Patient does complain of a loose mandibular anterior tooth (#25). Patient last saw a dentist at least 10 years ago. This was for fabrication of upper complete and lower partial dentures. Patient was unable to wear the dentures and currently is wearing the pre-existing upper complete denture that she wore prior to fabrication of the new dentures. This upper complete denture is at least 87+ years old. The patient's denies having any problems with this denture.  The patient cannot remember  the name of the dentist that she last saw. The patient denies having dental phobia.   DENTAL EXAMINATION: GENERAL: The patient is a well-developed, well-nourished female in no acute distress. HEAD AND NECK: There is no palpable neck lymphadenopathy. The patient denies acute TMJ symptoms. INTRAORAL EXAM: The patient has normal saliva. There is no evidence of oral abscess formation. There is atrophy of the edentulous alveolar ridges. DENTITION: Patient has an edentulous maxilla as well as missing tooth numbers 17-20, 30-32. There is evidence of significant  mandibular anterior attrition.  PERIODONTAL: The patient has chronic periodontitis with plaque and calculus accumulations, generalized gingival recession and tooth mobility as per dental charting form. Tooth mobility #25 is II+. DENTAL CARIES/SUBOPTIMAL RESTORATIONS: Multiple dental caries are noted as per dental charting form. ENDODONTIC: Patient currently denies acute pulpitis symptoms. Patient does have periapical radiolucency associated with the apex of tooth #25. CROWN AND BRIDGE: There are no crown or bridge restorations. PROSTHODONTIC: Patient has an existing upper complete denture fabricated approximately 20 years ago. The denture is  stable and retentive. The denture teeth are worn. Patient does have a history of upper complete and lower partial denture fabricated less than 10 years ago but the patient does not wear this set of dentures. OCCLUSION: Patient has a poor occlusal scheme secondary to multiple missing teeth and lack of replacement of all missing teeth with clinically acceptable dental prostheses.  RADIOGRAPHIC INTERPRETATION: An orthopantogram was taken and supplemented with 5 lower periapical radiographs There are multiple missing teeth. There is a periapical radiolucency associated with the apex of tooth #25. There is moderate bone loss noted. There is atrophy of the edentulous alveolar ridges. Dental caries are noted. There is evidence of incisal attrition.  ASSESSMENTS: 1. Severe aortic stenosis 2. Pre-heart valve surgery dental protocol 3. Chronic apical periodontitis 4. Dental caries 5. Mandibular anterior incisal attrition 6. Chronic periodontitis with bone loss 7. Gingival recession  8. Accretions 9. Tooth mobility 10. Multiple missing teeth 11. Poor occlusal scheme and malocclusion  PLAN/RECOMMENDATIONS: 1. I discussed the risks, benefits, and complications of various treatment options with the patient in relationship to her medical and dental conditions, anticipated heart valve surgery, and risk for endocarditis. We discussed various treatment options to include no treatment, total and subtotal extractions with alveoloplasty, pre-prosthetic surgery as indicated, periodontal therapy, dental restorations, root canal therapy, crown and bridge therapy, implant therapy, and replacement of missing teeth as indicated. The patient currently wishes to proceed with extraction of tooth #25 with alveoloplasty as needed along with periodontal therapy to be performed in the dental clinic next week.  Patient will require antibiotic premedication prior to invasive dental procedures due to the anticipated heart valve  surgery in the near future. Patient will utilize clindamycin 600 mg by mouth one hour prior to invasive dental procedures. The patient currently refuses dental restoration of the dental caries at this time. Patient will follow up with the dentist of her choice for dental restorations after her heart valve surgery once she is cleared medically by the cardiology team. Patient understands that she will need antibiotic premedication prior to invasive dental procedures after her heart valve surgery.  2. Discussion of findings with medical team and coordination of future medical and dental care as needed.  I spent in excess of  120 minutes during the conduct of this consultation and >50% of this time involved direct face-to-face encounter for counseling and/or coordination of the patient's care.    Lenn Cal, DDS

## 2017-02-26 NOTE — Progress Notes (Signed)
CT scan completed. Tolerated well. D/C home in wheelchair with daughter. Awake and alert. In no distress 

## 2017-02-26 NOTE — Procedures (Signed)
Interventional Radiology Procedure Note  Procedure: Placement of a right upper arm micropuncture for IV access.  Ready to use  Complications: None Recommendations:  - Ok to [employer] - Do not submerge  - Routine care   Signed,  Dulcy Fanny. Earleen Newport, DO

## 2017-02-27 ENCOUNTER — Encounter: Payer: Self-pay | Admitting: Surgery

## 2017-02-27 ENCOUNTER — Ambulatory Visit (HOSPITAL_COMMUNITY)
Admission: RE | Admit: 2017-02-27 | Discharge: 2017-02-27 | Disposition: A | Discharge status: Home / Self Care | Payer: Medicare Other | Source: Ambulatory Visit | Attending: Cardiovascular Disease | Admitting: Cardiovascular Disease

## 2017-02-27 ENCOUNTER — Other Ambulatory Visit: Payer: Self-pay | Admitting: *Deleted

## 2017-02-27 ENCOUNTER — Ambulatory Visit (HOSPITAL_BASED_OUTPATIENT_CLINIC_OR_DEPARTMENT_OTHER)
Admission: RE | Admit: 2017-02-27 | Discharge: 2017-02-27 | Disposition: A | Discharge status: Home / Self Care | Payer: Medicare Other | Source: Ambulatory Visit | Attending: Cardiovascular Disease | Admitting: Cardiovascular Disease

## 2017-02-27 ENCOUNTER — Institutional Professional Consult (permissible substitution) (INDEPENDENT_AMBULATORY_CARE_PROVIDER_SITE_OTHER): Payer: Medicare Other | Admitting: Surgery

## 2017-02-27 VITALS — BP 189/91 | HR 81 | Resp 16 | Ht 66.0 in | Wt 164.0 lb

## 2017-02-27 DIAGNOSIS — I350 Nonrheumatic aortic (valve) stenosis: Secondary | ICD-10-CM

## 2017-02-27 LAB — VAS US CAROTID
LCCADDIAS: -16 cm/s
LCCADSYS: -79 cm/s
LCCAPDIAS: 9 cm/s
LCCAPSYS: 67 cm/s
LEFT ECA DIAS: -12 cm/s
LEFT VERTEBRAL DIAS: 6 cm/s
LICADDIAS: -20 cm/s
LICADSYS: -98 cm/s
LICAPSYS: -103 cm/s
Left ICA prox dias: -19 cm/s
RCCADSYS: -77 cm/s
RCCAPSYS: 66 cm/s
RIGHT ECA DIAS: -9 cm/s
RIGHT VERTEBRAL DIAS: 9 cm/s
Right CCA prox dias: 12 cm/s

## 2017-02-27 LAB — PULMONARY FUNCTION TEST
DL/VA % PRED: 82 %
DL/VA: 4.13 ml/min/mmHg/L
DLCO UNC % PRED: 55 %
DLCO unc: 14.87 ml/min/mmHg
FEF 25-75 POST: 1.37 L/s
FEF 25-75 Pre: 0.92 L/sec
FEF2575-%Change-Post: 48 %
FEF2575-%PRED-POST: 112 %
FEF2575-%Pred-Pre: 75 %
FEV1-%Change-Post: 10 %
FEV1-%PRED-PRE: 77 %
FEV1-%Pred-Post: 85 %
FEV1-POST: 1.65 L
FEV1-Pre: 1.49 L
FEV1FVC-%Change-Post: 0 %
FEV1FVC-%PRED-PRE: 96 %
FEV6-%CHANGE-POST: 9 %
FEV6-%PRED-POST: 94 %
FEV6-%Pred-Pre: 85 %
FEV6-Post: 2.3 L
FEV6-Pre: 2.1 L
FEV6FVC-%CHANGE-POST: 0 %
FEV6FVC-%PRED-POST: 104 %
FEV6FVC-%Pred-Pre: 105 %
FVC-%CHANGE-POST: 9 %
FVC-%PRED-POST: 89 %
FVC-%Pred-Pre: 81 %
FVC-Post: 2.33 L
FVC-Pre: 2.12 L
POST FEV1/FVC RATIO: 71 %
Post FEV6/FVC ratio: 99 %
Pre FEV1/FVC ratio: 71 %
Pre FEV6/FVC Ratio: 99 %

## 2017-02-27 MED ORDER — ALBUTEROL SULFATE (2.5 MG/3ML) 0.083% IN NEBU
2.5000 mg | INHALATION_SOLUTION | Freq: Once | RESPIRATORY_TRACT | Status: AC
  Administered 2017-02-27: 2.5 mg via RESPIRATORY_TRACT

## 2017-02-27 NOTE — Progress Notes (Signed)
*  PRELIMINARY RESULTS* Vascular Ultrasound Carotid Duplex (Doppler) has been completed. Findings suggest 1-39% internal carotid artery stenosis bilaterally. Vertebral arteries are patent with antegrade flow.  02/27/2017 10:44 AM Maudry Mayhew, BS, RVT, RDCS, RDMS

## 2017-03-03 ENCOUNTER — Encounter: Payer: Self-pay | Admitting: Surgery

## 2017-03-03 NOTE — Progress Notes (Signed)
Patient ID: Amy Kaiser, female   DOB: [birth year], 83 y.o.   MRN: [id number]  [facility]  MULTIDISCIPLINARY HEART VALVE CLINIC  CARDIOTHORACIC SURGERY CONSULTATION REPORT  Referring Provider is Jacolyn Reedy, MD PCP is Vidal Schwalbe, MD  Chief Complaint  Patient presents with  . Aortic Stenosis    SEVERE...eval for TAVR...ECHO 3/26, CATH 4/18, CTA C/A/P, CARD. MORPH., PFT EXERCISE STUDY, CAROTID DUPLEX 4/25/, DENTAL SURGERY NEXT WEEK    HPI:  The patient is an 83 year old woman with diabetes, hyperlipidemia, asthma and COPD who was evaluated about a year ago by Dr. Wynonia Lawman for a heart murmur. Echo at that time showed moderate AS. She reports a history of asthma for at least 25 years and it has been mild requiring occasional inhaler use. Over the past 6 months she has developed progressive exertional shortness of breath and chest tightness resolved with rest. It usually occurs with walking up hills. She has not had dizziness or syncope. She has been using her inhaler frequently. She had an echo repeated recently that showed progression of her AS with a mean gradient of 43 mm Hg. LVEF was 65%.   She is here with her son and daughter today. She is widowed and lives at home by herself and is independent.   Past Medical History:  Diagnosis Date  . Anemia   . Anxiety   . Arthritis   . Asthma   . Cancer ([place])   . Cervical cancer ([place])   . COPD (chronic obstructive pulmonary disease) ([place])   . Depression   . Diabetes mellitus without complication ([place])   . Hyperlipidemia     Past Surgical History:  Procedure Laterality Date  . ABDOMINAL HYSTERECTOMY    . APPENDECTOMY    . CATARACT EXTRACTION    . IR RADIOLOGY PERIPHERAL GUIDED IV START  02/26/2017  . IR US GUIDE VASC ACCESS RIGHT  02/26/2017  . RIGHT/LEFT HEART CATH AND CORONARY ANGIOGRAPHY N/A 02/20/2017   Procedure: Right/Left Heart Cath and Coronary Angiography;  Surgeon: Sherren Mocha, MD;  Location: [facility];  Service: Cardiovascular;  Laterality: N/A;    Family History: Father died of MI and brother had heart disease.  Social History   Social History  . Marital status: Widowed    Spouse name: N/A  . Number of children: 3  . Years of education: N/A   Occupational History  . Not on file.   Social History Main Topics  . Smoking status: Never Smoker  . Smokeless tobacco: Never Used  . Alcohol use No  . Drug use: No  . Sexual activity: Not on file   Other Topics Concern  . Not on file   Social History Narrative  . No narrative on file    Current Outpatient Prescriptions  Medication Sig Dispense Refill  . aspirin 81 MG chewable tablet Chew by mouth daily.    . calcium-vitamin D (OSCAL WITH D) 500-200 MG-UNIT tablet Take 1 tablet by mouth 2 (two) times daily.    . Cholecalciferol (VITAMIN D3) 1000 units CAPS Take 1,000 Units by mouth daily.    Marland Kitchen FLOVENT HFA 220 MCG/ACT inhaler Take 1 puff by mouth at bedtime as needed (wheezing). sleep  12  . LORazepam (ATIVAN) 0.5 MG tablet Take 0.5 mg by mouth at bedtime as needed for sleep.   2  . Multiple Vitamins-Minerals (ICAPS AREDS 2 PO) Take 1 capsule by mouth 2 (two) times daily.    Marland Kitchen  rosuvastatin (CRESTOR) 10 MG tablet Take 10 mg by mouth every evening.    . VENTOLIN HFA 108 (90 BASE) MCG/ACT inhaler Inhale 2 puffs into the lungs 2 (two) times daily as needed for wheezing or shortness of breath.   2  . clindamycin (CLEOCIN) 150 MG capsule Take four (4) capsules one hour prior to dental appointment. (Patient not taking: Reported on 02/27/2017) 4 capsule 2   No current facility-administered medications for this visit.     Allergies  Allergen Reactions  . Alendronate Sodium     Doesn't recall   . Codeine Nausea And Vomiting  . Latex Hives and Itching    Caused blisters in her mouth  . Lisinopril     Weakness   . Morphine And Related Other (See Comments)    unknown  . Penicillins Other (See Comments)     Intolerance to ALL "CILLINS" Has patient had a PCN reaction causing immediate rash, facial/tongue/throat swelling, SOB or lightheadedness with hypotension: unknown Has patient had a PCN reaction causing severe rash involving mucus membranes or skin necrosis: unknown Has patient had a PCN reaction that required hospitalization unknown Has patient had a PCN reaction occurring within the last 10 years: unknown If all of the above answers are "NO", then may proceed with Cephalosporin use.   Christy Sartorius Sodium] Other (See Comments)    Leg pain   . Prozac [Fluoxetine Hcl]     Feels shaky   . Symbicort [Budesonide-Formoterol Fumarate]     shakey   . Wellbutrin [Bupropion]     Jittery       Review of Systems:   General:  normal appetite, decreased energy, no weight gain, no weight loss, no fever  Cardiac:  has chest pain with exertion, no chest pain at rest, moderate SOB with mild exertion, no resting SOB, no PND, no orthopnea, no palpitations, no arrhythmia, no atrial fibrillation, no LE edema, occasional dizzy spells, no syncope  Respiratory:  exertional shortness of breath, no home oxygen, has productive cough, no dry cough, no bronchitis, has wheezing, no hemoptysis, has asthma, no pain with inspiration or cough, no sleep apnea, no CPAP at night  GI:   no difficulty swallowing, no reflux, no frequent heartburn, no hiatal hernia, no abdominal pain, no constipation, no diarrhea, no hematochezia, no hematemesis, no melena  GU:   no dysuria,  no frequency, no urinary tract infection, no hematuria, no kidney stones, no kidney disease  Vascular:  no pain suggestive of claudication, has pain in feet, no leg cramps, has varicose veins, no DVT, no non-healing foot ulcer  Neuro:   no stroke, no TIA's, no seizures, no headaches, no temporary blindness one eye,  no slurred speech, no peripheral neuropathy, no chronic pain, no instability of gait, no memory/cognitive  dysfunction  Musculoskeletal: has arthritis, no joint swelling, has myalgias, no difficulty walking, normal mobility   Skin:   no rash, no itching, no skin infections, no pressure sores or ulcerations  Psych:   has anxiety, no depression, no nervousness, has unusual recent stress  Eyes:   no blurry vision, no floaters, no recent vision changes,  wears glasse  ENT:   no hearing loss, has loose or painful teeth, has partial dentures, last saw dentist 02/25/2017 and extraction of one tooth planned by Dr. Enrique Sack  Hematologic:  has easy bruising, has abnormal bleeding, no clotting disorder, no frequent epistaxis  Endocrine:  has diabetes, does not check CBG's at home  Physical Exam:   BP (!) 189/91 (BP Location: Right Arm, Patient Position: Sitting, Cuff Size: Large)   Pulse 81   Resp 16   Ht 5\' 6"  (1.676 m)   Wt 164 lb (74.4 kg)   SpO2 94% Comment: ON RA  BMI 26.47 kg/m   General:  Elderly but  well-appearing  HEENT:  Unremarkable, NCAT, PERLA, EOMI, oropharynx clear  Neck:   no JVD, no bruits, no adenopathy or thyromegaly  Chest:   clear to auscultation, symmetrical breath sounds, no wheezes, no rhonchi   CV:   RRR, grade III/VI crescendo/decrescendo murmur heard best at RSB,  no diastolic murmur  Abdomen:  soft, non-tender, no masses or organomegaly  Extremities:  warm, well-perfused, pulses palpable in feet, no LE edema  Rectal/GU  Deferred  Neuro:   Grossly non-focal and symmetrical throughout  Skin:   Clean and dry, no rashes, no breakdown   Diagnostic Tests:   2D echo from Dr. Thurman Coyer office reviewed on disc. It shows severe AS with a mean gradient of 43 mm Hg. The aortic valve is trileaflet with calcified leaflets and restricted mobility.  Amy Kaiser  Cardiac catheterization  Order# [id number]  Reading physician: Sherren Mocha, MD Ordering physician: Sherren Mocha, MD Study date: 02/20/17  Physicians   Panel Physicians Referring Physician Case Authorizing  Physician  Sherren Mocha, MD (Primary)    Procedures   Right/Left Heart Cath and Coronary Angiography  Conclusion   1. Mild-Moderate ostial RCA stenosis, component of catheter-induced vasospasm 2. Patent left main, LAD, LCx with minor nonobstructive CAD 3. Severe calcific aortic stenosis  Recommend: Continued TAVR evaluation. I do not think the ostial RCA is hemodynamically significant and likely a major component of catheter-induced vasospasm is present. Can further assess with gated cardiac CTA which will be done as part of her TAVR workup.  Indications   Severe aortic stenosis [I35.0 (ICD-10-CM)]  Procedural Details/Technique   Technical Details INDICATION: Severe aortic stenosis - Pre TAVR assessment  PROCEDURAL DETAILS: There was an indwelling IV in a right antecubital vein. Using normal sterile technique, the IV was changed out for a 5 Fr brachial sheath over a 0.018 inch wire. The right wrist was then prepped, draped, and anesthetized with 1% lidocaine. Using the modified Seldinger technique a 5/6 French Slender sheath was placed in the right radial artery. Intra-arterial verapamil was administered through the radial artery sheath. IV heparin was administered after a JR4 catheter was advanced into the central aorta. A Swan-Ganz catheter was used for the right heart catheterization. Standard protocol was followed for recording of right heart pressures and sampling of oxygen saturations. Fick cardiac output was calculated. Standard Judkins catheters were used for selective coronary angiography. The aortic valve is crossed with a straight wire directed by a JR4 catheter. Pullback gradients are measured. There is pressure dampening at the RCA ostium as the study progresses. IC NTG is administered and further imaging is done. A 4 Fr JR4 catheter is used to repeat imaging with NTG administered again. There were no immediate procedural complications. The patient was transferred to the post  catheterization recovery area for further monitoring.     Estimated blood loss <50 mL.  During this procedure the patient was administered the following to achieve and maintain moderate conscious sedation: Versed 3 mg, Fentanyl 50 mcg, while the patient's heart rate, blood pressure, and oxygen saturation were continuously monitored. The period of conscious sedation was 53 minutes, of which I was present face-to-face 100% of this  time.    Coronary Findings   Dominance: Right  Left Anterior Descending  Prox LAD lesion, 25% stenosed.  Left Circumflex  Prox Cx to Mid Cx lesion, 25% stenosed.  Right Coronary Artery  Ost RCA lesion, 50% stenosed. Probably 40-50% RCA stenosis with catheter-induced vasospasm. Appearance worsens with subsequent injections to 80% stenosis but highly suspicious for vasospasm. Some improvement with IC NTG and repeat imaging with a 4 Fr catheter.  Right Heart   Right Heart Pressures Elevated LV EDP consistent with volume overload.    Left Heart   Aortic Valve Mean gradient 28 mmHg, AVA 1.0 square cm    Coronary Diagrams   Diagnostic Diagram       Implants     No implant documentation for this case.  PACS Images   Show images for Cardiac catheterization   Link to Procedure Log   Procedure Log    Hemo Data    Most Recent Value  Fick Cardiac Output 6.33 L/min  Fick Cardiac Output Index 3.3 (L/min)/BSA  Aortic Mean Gradient 28.3 mmHg  Aortic Peak Gradient 29 mmHg  Aortic Valve Area 1.00  Aortic Value Area Index 0.52 cm2/BSA  RA A Wave 11 mmHg  RA V Wave 9 mmHg  RA Mean 9 mmHg  RV Systolic Pressure 44 mmHg  RV Diastolic Pressure 7 mmHg  RV EDP 12 mmHg  PA Systolic Pressure 41 mmHg  PA Diastolic Pressure 11 mmHg  PA Mean 28 mmHg  AO Systolic Pressure 829 mmHg  AO Diastolic Pressure 78 mmHg  AO Mean 937 mmHg  LV Systolic Pressure 169 mmHg  LV Diastolic Pressure 13 mmHg  LV EDP 18 mmHg  Arterial Occlusion Pressure Extended Systolic  Pressure 678 mmHg  Arterial Occlusion Pressure Extended Diastolic Pressure 80 mmHg  Arterial Occlusion Pressure Extended Mean Pressure 120 mmHg  Left Ventricular Apex Extended Systolic Pressure 938 mmHg  Left Ventricular Apex Extended Diastolic Pressure 15 mmHg  Left Ventricular Apex Extended EDP Pressure 20 mmHg  QP/QS 1  TPVR Index 8.49 HRUI  TSVR Index 36.06 HRUI  TPVR/TSVR Ratio 0.24    CT CORONARY MORPH W/CTA COR W/SCORE W/CA W/CM &/OR WO/CM (Accession [id number]) (Order [id number])  Imaging  Date: 02/26/2017 Department: [facility] [place] CT IMAGING Released By: Reggy Eye Authorizing: Sherren Mocha, MD  Exam Information   Status Exam Begun  Exam Ended   Final [99] 02/26/2017 11:02 AM 02/26/2017 11:39 AM  PACS Images   Show images for CT CORONARY MORPH W/CTA COR W/SCORE W/CA W/CM &/OR WO/CM  Addendum   ADDENDUM REPORT: 02/28/2017 16:26  CLINICAL DATA:  83 year old female with severe aortic stenosis and suspicion for severe RCA stenosis on cardiac catheterization. Evaluate severity of RCA stenosis and evaluate for TAVR.  EXAM: Cardiac TAVR CT  TECHNIQUE: The patient was scanned on a Philips 256 scanner. A 120 kV retrospective scan was triggered in the descending thoracic aorta at 111 HU's. Gantry rotation speed was 270 msecs and collimation was .9 mm. 10 mg of iv metoprolol or 0.4 mg of sl NTG were given. The 3D data set was reconstructed in 5% intervals of the R-R cycle. Systolic and diastolic phases were analyzed on a dedicated work station using MPR, MIP and VRT modes. The patient received 80 cc of contrast.  FINDINGS: Aortic Valve: Trileaflet, moderately thickened, mildly calcified with no calcium extending into LVOT.  Aorta:  Mild diffuse calcifications.  No dissection.  Sinotubular Junction:  23 x 22 mm  Ascending Thoracic Aorta:  29 x 28 mm  Aortic Arch:  25 x 24 mm  Descending Thoracic Aorta:  20 x 19 mm  Sinus of  Valsalva Measurements:  Non-coronary:  24 mm  Right -coronary:  22 mm  Left -coronary:  25 mm  Coronary Artery Height above Annulus:  Left Main:  12 mm  Right Coronary:  13 mm  Virtual Basal Annulus Measurements:  Maximum/Minimum Diameter:  23 x 17 mm  Perimeter:  70 mm  Area:  297 mm2  Coronary Arteries:  Normal origin, right dominance.  RCA is a very large dominant artery giving rise to PDA and PLVB. There is a severe mixed, predominantly noncalcified plaque in the ostial RCA suspicious for > 70 stenosis. Minimal plaque in the remaining RCA.  LM is a large long vessel with no plaque.  LAD is a large vessel that gives rise to one diagonal branch. There is a long calcific lesion in the proximal LAD extending into diagonal branch associated with 25-50% stenosis.  LCX is a small non-dominant vessel that has only minimal noncalcified plaque.  Optimum Fluoroscopic Angle for Delivery:  LAO 8, CAU 9  IMPRESSION: 1. Trileaflet, moderately thickened, mildly calcified aortic valve with no calcium extending into LVOT. Annular measurements suitable for delivery of a 23 mm Edwards-SAPIEN 3 valve.  2. Sufficient annulus to coronary distance.  3. Optimum Fluoroscopic Angle for Delivery: LAO 8, CAU 9  4. There is a severe mixed, predominantly noncalcified plaque in the ostial RCA suspicious for > 70 stenosis. Please see attachment for full CT FFR analysis. Ostial RCA FFR 0.65. Revascularization is recommended.  Ena Dawley   Electronically Signed   By: Ena Dawley   On: 02/28/2017 16:26      CT CORONARY FRACTIONAL FLOW RESERVE DATA PREP (Accession [id number]) (Order [id number])  Imaging  Date: 02/26/2017 Department: [facility] CT IMAGING Released By: Reggy Eye Authorizing: Sherren Mocha, MD  Exam Information   Status Exam Begun  Exam Ended   Final [99] 02/27/2017 3:31 PM 02/27/2017 3:31 PM  Addendum    ADDENDUM REPORT: 02/28/2017 15:42  EXAM: FF/RCT ANALYSIS  FINDINGS: FFRct analysis was performed on the original cardiac CT angiogram dataset. Diagrammatic representation of the FFRct analysis is provided in a separate PDF document in PACS. This dictation was created using the PDF document and an interactive 3D model of the results. 3D model is not available in the EMR/PACS. Normal FFR range is >0.80.  1. Left Main:  No significant stenosis.  2. LAD: No significant stenosis in the proximal and mid segment, FFR 0.75 in the very distal portion of LAD. 3. LCX: No significant stenosis. 4. RCA: There is significant stenosis in the ostial RCA with FFR 0.65.  IMPRESSION: 1. CT FFR analysis show significant stenosis in the ostial RCA (FFR 0.65) correlating well with the anatomical findings.   Electronically Signed   By: Ena Dawley   On: 02/28/2017 15:42      CT Angio Abd/Pel w/ and/or w/o (Accession [id number]) (Order [id number])  Imaging  Date: 02/26/2017 Department: [facility] CT IMAGING Released By: Reggy Eye Authorizing: Sherren Mocha, MD  Exam Information   Status Exam Begun  Exam Ended   Final [99] 02/26/2017 11:06 AM 02/26/2017 11:42 AM  PACS Images   Show images for CT Angio Abd/Pel w/ and/or w/o  Study Result   CLINICAL DATA:  83 year old female with history of severe aortic stenosis. Preprocedural study prior to potential transcatheter aortic valve replacement (  TAVR) procedure.  EXAM: CT ANGIOGRAPHY CHEST, ABDOMEN AND PELVIS  TECHNIQUE: Multidetector CT imaging through the chest, abdomen and pelvis was performed using the standard protocol during bolus administration of intravenous contrast. Multiplanar reconstructed images and MIPs were obtained and reviewed to evaluate the vascular anatomy.  CONTRAST:  80 mL of Isovue 370.  COMPARISON:  Chest CT 07/11/2010. CT the abdomen and  pelvis 10/24/2009.  FINDINGS: CTA CHEST FINDINGS  Cardiovascular: Heart size is normal. There is no significant pericardial fluid, thickening or pericardial calcification. There is aortic atherosclerosis, as well as atherosclerosis of the great vessels of the mediastinum and the coronary arteries, including calcified atherosclerotic plaque in the left anterior descending coronary artery. Thickening calcification of the aortic valve, compatible with the reported clinical history of severe aortic stenosis.  Mediastinum/Lymph Nodes: No pathologically enlarged mediastinal or hilar lymph nodes. Small hiatal hernia. No axillary lymphadenopathy.  Lungs/Pleura: No acute consolidative airspace disease. No pleural effusions. No suspicious appearing pulmonary nodules or masses. Mild linear scarring in the lung bases bilaterally.  Musculoskeletal/Soft Tissues: There are no aggressive appearing lytic or blastic lesions noted in the visualized portions of the skeleton.  CTA ABDOMEN AND PELVIS FINDINGS  Hepatobiliary: No cystic or solid hepatic lesions. No intra or extrahepatic biliary ductal dilatation. Gallbladder is normal in appearance.  Pancreas: No pancreatic mass. No pancreatic ductal dilatation. No pancreatic or peripancreatic fluid or inflammatory changes.  Spleen: Unremarkable.  Adrenals/Urinary Tract: Multiple tiny 2-3 mm low-attenuation lesions in both kidneys are too small to characterize, but are statistically likely to represent tiny cysts. Bilateral adrenal glands are normal in appearance. No hydroureteronephrosis. Urinary bladder is normal in appearance.  Stomach/Bowel: The appearance of the stomach is normal. No pathologic dilatation of small bowel or colon. Numerous colonic diverticulae are noted, particularly in the sigmoid colon, without surrounding inflammatory changes to suggest an acute diverticulitis at this time. Normal  appendix.  Vascular/Lymphatic: Aortic atherosclerosis, without evidence of aneurysm or dissection in the abdominal or pelvic vasculature. Vascular measurements pertinent to potential TAVR procedure, as detailed below. The celiac axis, superior mesenteric artery and inferior mesenteric artery are all widely patent without hemodynamically significant stenosis. Single renal arteries bilaterally are widely patent. No lymphadenopathy noted in the abdomen or pelvis.  Reproductive: Status post hysterectomy. Ovaries are not confidently identified may be surgically absent or atrophic.  Other: No significant volume of ascites.  No pneumoperitoneum.  Musculoskeletal: There are no aggressive appearing lytic or blastic lesions noted in the visualized portions of the skeleton.  VASCULAR MEASUREMENTS PERTINENT TO TAVR:  AORTA:  Minimal Aortic Diameter -  11 x 11 mm  Severity of Aortic Calcification -  moderate  RIGHT PELVIS:  Right Common Iliac Artery -  Minimal Diameter - 7.9 x 8.7 mm  Tortuosity - mild  Calcification - mild  Right External Iliac Artery -  Minimal Diameter - 5.9 x 6.1 mm  Tortuosity - mild  Calcification - none  Right Common Femoral Artery -  Minimal Diameter - 6.3 x 6.8 mm  Tortuosity - mild  Calcification - none  LEFT PELVIS:  Left Common Iliac Artery -  Minimal Diameter - 8.2 x 7.7 mm  Tortuosity - mild  Calcification - none  Left External Iliac Artery -  Minimal Diameter - 6.2 x 6.2 mm  Tortuosity - mild  Calcification - none  Left Common Femoral Artery -  Minimal Diameter - 6.3 x 7.0 mm  Tortuosity - mild  Calcification - none  Review of the MIP images confirms  the above findings.  IMPRESSION: 1. Vascular findings and measurements pertinent to potential TAVR procedure, as detailed above. This patient has suitable pelvic arterial access bilaterally. 2. Thickening calcification of the aortic  valve, compatible with the reported clinical history of severe aortic stenosis. 3. Aortic atherosclerosis, in addition to left anterior descending coronary artery disease. 4. Small hiatal hernia. 5. Colonic diverticulosis without evidence of acute diverticulitis at this time. 6. Additional incidental findings, as above.   Electronically Signed   By: Vinnie Langton M.D.   On: 02/26/2017 14:44    RISK SCORES\pardAbout the STS Risk Calculator\pardProcedure: AV Replacement + CAB\cb3 Risk of Mortality: 6.151%\cb3 Morbidity or Mortality: 26.418%\cb3 Long Length of Stay: 18.802%\cb3 Short Length of Stay: 12.462%\cb3 Permanent Stroke: 2.042%\cb3 Prolonged Ventilation: 21.268%\cb3 DSW Infection: 0.437%\cb3 Renal Failure: 6.955%\cb3 Reoperation: 9.367%  Impression:  This 83 year old woman has stage D, severe, symptomatic aortic stenosis with progressive exertional shortness of breath and fatigue consistent with NYHA class III chronic diastolic heart failure. She also has some chest discomfort and dizziness at times. Her cath showed some stenosis of the RCA ostium that worsened with further engagement of the ostium and it was unclear if it worsened from spasm or that the degree of stenosis was worse than it appeared initially. Her gated cardiac CT with FFR of the RCA indicates that it may be more significant than initially felt with an FFR of 0.65. There are no other significant coronary stenoses. I have personally reviewed her echo, cath, and CT studies. She has severe AS with a trileaflet valve with mild calcified, thickened leaflets with restricted mobility.  I think AVR is indicated in this patient for relief of symptoms and improvement in quality of life as well as to prevent progressive LV dysfunction. She is at least intermediate risk for open surgical AVR and CABG with an STS PROM of 6.2%. I think TAVR would be a reasonable alternative for her if the RCA requires revascularization and can be  done with PCI. The heart valve team will need to discuss that. Her cardiac CT shows favorable anatomy for a sapien 3 valve and her abdominal and pelvic CT shows anatomy suitable for transfemoral approach.   The patient and her family were counseled at length regarding treatment alternatives for management of severe symptomatic aortic stenosis. The risks and benefits of surgical intervention has been discussed in detail. Long-term prognosis with medical therapy was discussed. Alternative approaches such as conventional surgical aortic valve replacement, transcatheter aortic valve replacement, and palliative medical therapy were compared and contrasted at length. This discussion was placed in the context of the patient's own specific clinical presentation and past medical history. All of their questions been addressed. The patient is eager to proceed with surgical management as soon as possible. I will need to discuss management of her RCA ostial stenosis with Dr. Burt Knack.   The patient has been advised of a variety of complications that might develop including but not limited to risks of death, stroke, paravalvular leak, aortic dissection or other major vascular complications, aortic annulus rupture, device embolization, cardiac rupture or perforation, mitral regurgitation, acute myocardial infarction, arrhythmia, heart block or bradycardia requiring permanent pacemaker placement, congestive heart failure, respiratory failure, renal failure, pneumonia, infection, other late complications related to structural valve deterioration or migration, or other complications that might ultimately cause a temporary or permanent loss of functional independence or other long term morbidity. The patient provides full informed consent for the TAVR procedure as described if we decide to proceed  in that direction and all questions were answered.     Plan:  She has an appt to have one tooth extracted by Dr. Enrique Sack next  week.   I will discuss management of her RCA ostial stenosis with Dr. Burt Knack and then we will decide what surgical procedure to perform for her severe AS.   I spent 60 minutes performing this consultation and > 50% of this time was spent face to face counseling and coordinating the care of this patient's severe aortic stenosis.    Gaye Pollack, MD 02/27/2017

## 2017-03-04 ENCOUNTER — Telehealth: Payer: Self-pay

## 2017-03-04 MED ORDER — CLOPIDOGREL BISULFATE 75 MG PO TABS: 75.0000 mg | ORAL_TABLET | Freq: Every day | ORAL | 11 refills | Status: DC

## 2017-03-04 NOTE — Telephone Encounter (Signed)
I spoke with [name] and made her aware that TAVR will be cancelled on 03/12/17 due to pt needing PCI to RCA. I made her aware that the pt needs to proceed with dental procedure as scheduled on 03/05/17 and will need to start Plavix 75mg  once a day on Wednesday. Rx has been sent to the pharmacy. I made [name] aware of pre-procedure instructions.     You are scheduled for a Cardiac Catheterization (PCI to RCA) on Monday, May 7 with Dr. Sherren Mocha.  1. Please arrive at the [facility] (Main Entrance A) at [facility]: [place], [place] [address] at 10:00 AM (two hours before your procedure to ensure your preparation). Free valet parking service is available.   Special note: Every effort is made to have your procedure done on time. Please understand that emergencies sometimes delay scheduled procedures.  2. Diet: Do not eat or drink anything after midnight prior to your procedure except sips of water to take medications.  3. Labs: Your labs will be performed at the hospital after you arrive for your procedure.  4. Medication instructions in preparation for your procedure:  On the morning of your procedure, take your Aspirin and Plavix/Clopidogrel and any morning medicines NOT listed above.  You may use sips of water.  5. Plan for one night stay--bring personal belongings.  6. Bring a current list of your medications and current insurance cards.  7. You MUST have a responsible person to drive you home.  8. Someone MUST be with you the first 24 hours after you arrive home or your discharge will be delayed.  9. Please wear clothes that are easy to get on and off and wear slip-on shoes.  Thank you for allowing [place] to care for you!   -- [facility]

## 2017-03-05 ENCOUNTER — Ambulatory Visit (HOSPITAL_COMMUNITY): Payer: Self-pay | Admitting: Dentistry

## 2017-03-05 ENCOUNTER — Encounter (HOSPITAL_COMMUNITY): Payer: Self-pay | Admitting: Dentistry

## 2017-03-05 VITALS — BP 165/65 | HR 87 | Temp 98.2°F

## 2017-03-05 DIAGNOSIS — I350 Nonrheumatic aortic (valve) stenosis: Secondary | ICD-10-CM

## 2017-03-05 DIAGNOSIS — K045 Chronic apical periodontitis: Secondary | ICD-10-CM

## 2017-03-05 DIAGNOSIS — K036 Deposits [accretions] on teeth: Secondary | ICD-10-CM

## 2017-03-05 DIAGNOSIS — K0530 Chronic periodontitis, unspecified: Secondary | ICD-10-CM

## 2017-03-05 DIAGNOSIS — K0889 Other specified disorders of teeth and supporting structures: Secondary | ICD-10-CM

## 2017-03-05 DIAGNOSIS — Z01818 Encounter for other preprocedural examination: Secondary | ICD-10-CM

## 2017-03-05 NOTE — Progress Notes (Signed)
OPERATIVE REPORT  Patient:            Amy Kaiser Date of Birth:  11-21-1928 MRN:                [id number]   DATE OF PROCEDURE:  03/05/2017  PREOPERATIVE DIAGNOSES: 1. Severe aortic stenosis 2. Pre-heart valve surgery dental protocol 3. Chronic apical periodontitis 4. Chronic periodontitis 5. Loose tooth 6. Accretions   POSTOPERATIVE DIAGNOSES: 1. Severe aortic stenosis 2. Pre-heart valve surgery dental protocol 3. Chronic apical periodontitis 4. Chronic periodontitis 5. Loose tooth 6. Accretions   OPERATIONS: 1. Extraction of tooth #25 2. Adult prophylaxis   SURGEON: Lenn Cal, DDS  ASSISTANT: Camie Patience, (dental assistant)  ANESTHESIA: Local anesthesia only   MEDICATIONS: 1. Clindamycin 600 mg by mouth one hour prior to invasive dental procedures.  2. Local anesthesia with a total utilization of one carpules each containing 34 mg of lidocaine with 0.017 mg of epinephrine   SPECIMENS: There was one tooth that was discarded.   DRAINS: None  CULTURES: None  COMPLICATIONS: None   ESTIMATED BLOOD LOSS: Less than 10 ML's.  INTRAVENOUS FLUIDS: None   INDICATIONS: The patient was recently diagnosed with severe aortic stenosis.  A medically necessary dental consultation was then requested to evaluate poor dentition.  The patient was examined and treatment planned for extraction of tooth #25 with alveoloplasty as needed along with adult prophylaxis.  This treatment plan was formulated to decrease the risks and complications associated with dental infection from affecting the patient's systemic health and anticipated heart valve surgery.  OPERATIVE FINDINGS: Patient was examined and operatory #1. Tooth #25 was identified for extraction. The patient was noted be affected by chronic periodontitis, accretions, loose tooth #25, and chronic apical periodontitis.  DESCRIPTION OF PROCEDURE: Patient was brought to the dental operatory #1. Patient was then  placed in the  sitting position.  Informed consent was obtained and witnessed. The patient was then prepped and draped in the usual manner for dental medicine procedure. A timeout was performed. The patient was identified and procedures were verified. The oral cavity was then thoroughly examined with the findings as noted above. The patient was then ready for dental medicine procedure as follows:  Local anesthesia was then administered sequentially with a total utilization of one carpule containing 34 mg of lidocaine with 0.017 mg of epinephrine with a mandibular right mental nerve block and infiltration in the area of #25.   At this point time, the mandibular quadrants were approached.  An adult prophylaxis was performed utilizing a sonic scaler and a series of hand curettes. The teeth were then polished and flossed appropriately. Oral hygiene instructions was provided at this time.   Tooth #25 was then approached and removed with a 151 forceps without complications. The socket was curetted and compressed appropriately. The surgical site was irrigated with copious amounts of sterile saline. The surgical site was then closed utilizing 3-0 chromic gut suture in a continuous interrupted suture technique from the mesial of #24 and extended to the mesial #26.  A series of 2 x 2 gauzes were placed in the mouth to aid hemostasis. The patient was then evaluated for complications, seeing none, the dental medicine procedure was deemed to be complete. All counts were correct for the dental medicine procedure. Postoperative vital signs were obtained. The patient was then dismissed to the care of her son in good condition. The patient is to use Tylenol for her postoperative pain and discomfort. Patient  to return to clinic for evaluation for suture removal in 7-10 days. Patient to call if problems arise. Lenn Cal, DDS.

## 2017-03-05 NOTE — Progress Notes (Signed)
PRE-OPERATIVE NOTE:  03/05/2017 Amy Kaiser [id number]  VITALS: BP (!) 165/65 (BP Location: Left Arm)   Pulse 87   Temp 98.2 F (36.8 C) (Oral)   Lab Results  Component Value Date   WBC 8.6 02/15/2017   HGB 11.2 (L) 04/27/2016   HCT 35.8 02/15/2017   MCV 95 02/15/2017   PLT 276 02/15/2017   BMET    Component Value Date/Time   NA 143 02/15/2017 1404   K 5.0 02/15/2017 1404   CL 103 02/15/2017 1404   CO2 25 02/15/2017 1404   GLUCOSE 132 (H) 02/15/2017 1404   GLUCOSE 125 (H) 04/27/2016 0805   BUN 13 02/15/2017 1404   CREATININE 0.85 02/15/2017 1404   CALCIUM 9.2 02/15/2017 1404   GFRNONAA 63 02/15/2017 1404   GFRAA 72 02/15/2017 1404    Lab Results  Component Value Date   INR 0.9 02/15/2017   No results found for: PTT   Amy Kaiser presents for extraction of tooth #25 with dental cleaning in the dental medicine clinic.   SUBJECTIVE: The patient denies any acute dental changes. Patient indicates that the heart valve surgery has been delayed until she receives a stent that is scheduled for next week. Patient is also to start taking Plavix 75 mg daily per Dr. Burt Knack. Patient has not started this prescription as of yet. I contacted Dr. Burt Knack and informed him of the planned for extraction of tooth #25-today. He agrees to delay start of Plavix therapy until Thursday morning. Patient expresses understanding with this plan of care.  EXAM: No sign of acute dental changes.  ASSESSMENT: Patient is affected by chronic apical periodontitis, chronic periodontitis, loose tooth #25, and accretions.  PLAN: Patient agrees to proceed with treatment as planned in the dental medicine clinic as previously discussed and accepts the risks, benefits, and complications of the proposed treatment. Patient is aware of the risk for bleeding, bruising, swelling, infection, pain, nerve damage, root tip fracture, and mandible fracture. Patient also is aware of the potential for other  complications not mentioned above.   Lenn Cal, DDS

## 2017-03-05 NOTE — Patient Instructions (Signed)
The patient is to return to clinic as scheduled for evaluation of healing and suture removal. Patient is a start salt water rinses tomorrow as needed to aid healing. Patient is to start Plavix therapy on Thursday morning per instructions from Dr. Burt Knack. She is to use Tylenol over-the-counter as needed for postoperative discomfort.  Dr. Enrique Sack  MOUTH CARE AFTER SURGERY  FACTS:  Ice used in ice bag helps keep the swelling down, and can help lessen the pain.  It is easier to treat pain BEFORE it happens.  Spitting disturbs the clot and may cause bleeding to start again, or to get worse.  Smoking delays healing and can cause complications.  Sharing prescriptions can be dangerous.  Do not take medications not recently prescribed for you.  Antibiotics may stop birth control pills from working.  Use other means of birth control while on antibiotics.  Warm salt water rinses after the first 24 hours will help lessen the swelling:  Use 1/2 teaspoonful of table salt per oz.of water.  DO NOT:  Do not spit.  Do not drink through a straw.  Strongly advised not to smoke, dip snuff or chew tobacco at least for 3 days.  Do not eat sharp or crunchy foods.  Avoid the area of surgery when chewing.  Do not stop your antibiotics before your instructions say to do so.  Do not eat hot foods until bleeding has stopped.  If you need to, let your food cool down to room temperature.  EXPECT:  Some swelling, especially first 2-3 days.  Soreness or discomfort in varying degrees.  Follow your dentist's instructions about how to handle pain before it starts.  Pinkish saliva or light blood in saliva, or on your pillow in the morning.  This can last around 24 hours.  Bruising inside or outside the mouth.  This may not show up until 2-3 days after surgery.  Don't worry, it will go away in time.  Pieces of "bone" may work themselves loose.  It's OK.  If they bother you, let us know.  WHAT TO DO  IMMEDIATELY AFTER SURGERY:  Bite on the gauze with steady pressure for 1-2 hours.  Don't chew on the gauze.  Do not lie down flat.  Raise your head support especially for the first 24 hours.  Apply ice to your face on the side of the surgery.  You may apply it 20 minutes on and a few minutes off.  Ice for 8-12 hours.  You may use ice up to 24 hours.  Before the numbness wears off, take a pain pill as instructed.  Prescription pain medication is not always required.  SWELLING:  Expect swelling for the first couple of days.  It should get better after that.  If swelling increases 3 days or so after surgery; let us know as soon as possible.  FEVER:  Take Tylenol every 4 hours if needed to lower your temperature, especially if it is at 100F or higher.  Drink lots of fluids.  If the fever does not go away, let us know.  BREATHING TROUBLE:  Any unusual difficulty breathing means you have to have someone bring you to the emergency room ASAP  BLEEDING:  Light oozing is expected for 24 hours or so.  Prop head up with pillows  Avoid spitting  Do not confuse bright red fresh flowing blood with lots of saliva colored with a little bit of blood.  If you notice some bleeding, place gauze or a  tea bag where it is bleeding and apply CONSTANT pressure by biting down for 1 hour.  Avoid talking during this time.  Do not remove the gauze or tea bag during this hour to "check" the bleeding.  If you notice bright RED bleeding FLOWING out of particular area, and filling the floor of your mouth, put a wad of gauze on that area, bite down firmly and constantly.  Call us immediately.  If we're closed, have someone bring you to the emergency room.  ORAL HYGIENE:  Brush your teeth as usual after meals and before bedtime.  Use a soft toothbrush around the area of surgery.  DO NOT AVOID BRUSHING.  Otherwise bacteria(germs) will grow and may delay healing or encourage infection.  Since you  cannot spit, just gently rinse and let the water flow out of your mouth.  DO NOT SWISH HARD.  EATING:  Cool liquids are a good point to start.  Increase to soft foods as tolerated.  PRESCRIPTIONS:  Follow the directions for your prescriptions exactly as written.  If Dr. Enrique Sack gave you a narcotic pain medication, do not drive, operate machinery or drink alcohol when on that medication.  QUESTIONS:  Call our office during office hours [phone] or call the Emergency Room at [phone].

## 2017-03-07 ENCOUNTER — Other Ambulatory Visit (HOSPITAL_COMMUNITY): Payer: Self-pay

## 2017-03-07 ENCOUNTER — Encounter: Payer: Self-pay | Admitting: Thoracic Surgery (Cardiothoracic Vascular Surgery)

## 2017-03-11 ENCOUNTER — Ambulatory Visit (HOSPITAL_COMMUNITY)
Admission: RE | Admit: 2017-03-11 | Discharge: 2017-03-12 | Disposition: A | Discharge status: Home / Self Care | Payer: Medicare Other | Source: Ambulatory Visit | Attending: Cardiology | Admitting: Cardiology

## 2017-03-11 ENCOUNTER — Encounter (HOSPITAL_COMMUNITY)
Admission: RE | Disposition: A | Discharge status: Home / Self Care | Payer: Self-pay | Source: Ambulatory Visit | Attending: Cardiology

## 2017-03-11 ENCOUNTER — Encounter (HOSPITAL_COMMUNITY): Payer: Self-pay | Admitting: General Practice

## 2017-03-11 DIAGNOSIS — F419 Anxiety disorder, unspecified: Secondary | ICD-10-CM | POA: Insufficient documentation

## 2017-03-11 DIAGNOSIS — Z7982 Long term (current) use of aspirin: Secondary | ICD-10-CM | POA: Diagnosis not present

## 2017-03-11 DIAGNOSIS — J449 Chronic obstructive pulmonary disease, unspecified: Secondary | ICD-10-CM | POA: Insufficient documentation

## 2017-03-11 DIAGNOSIS — Z7902 Long term (current) use of antithrombotics/antiplatelets: Secondary | ICD-10-CM | POA: Insufficient documentation

## 2017-03-11 DIAGNOSIS — Z880 Allergy status to penicillin: Secondary | ICD-10-CM | POA: Insufficient documentation

## 2017-03-11 DIAGNOSIS — I350 Nonrheumatic aortic (valve) stenosis: Secondary | ICD-10-CM | POA: Insufficient documentation

## 2017-03-11 DIAGNOSIS — I25118 Atherosclerotic heart disease of native coronary artery with other forms of angina pectoris: Secondary | ICD-10-CM | POA: Diagnosis not present

## 2017-03-11 DIAGNOSIS — Z91040 Latex allergy status: Secondary | ICD-10-CM | POA: Insufficient documentation

## 2017-03-11 DIAGNOSIS — E785 Hyperlipidemia, unspecified: Secondary | ICD-10-CM | POA: Diagnosis not present

## 2017-03-11 DIAGNOSIS — E119 Type 2 diabetes mellitus without complications: Secondary | ICD-10-CM | POA: Diagnosis not present

## 2017-03-11 DIAGNOSIS — I10 Essential (primary) hypertension: Secondary | ICD-10-CM | POA: Diagnosis not present

## 2017-03-11 DIAGNOSIS — M1990 Unspecified osteoarthritis, unspecified site: Secondary | ICD-10-CM | POA: Insufficient documentation

## 2017-03-11 DIAGNOSIS — F329 Major depressive disorder, single episode, unspecified: Secondary | ICD-10-CM | POA: Insufficient documentation

## 2017-03-11 HISTORY — DX: Hypoglycemia, unspecified: E16.2

## 2017-03-11 HISTORY — DX: Unspecified malignant neoplasm of skin, unspecified: C44.90

## 2017-03-11 HISTORY — DX: Migraine, unspecified, not intractable, without status migrainosus: G43.909

## 2017-03-11 HISTORY — DX: Cardiac murmur, unspecified: R01.1

## 2017-03-11 HISTORY — PX: CORONARY STENT INTERVENTION: CATH118234

## 2017-03-11 HISTORY — PX: CORONARY ANGIOPLASTY WITH STENT PLACEMENT: SHX49

## 2017-03-11 LAB — PROTIME-INR
INR: 0.96
Prothrombin Time: 12.8 seconds (ref 11.4–15.2)

## 2017-03-11 LAB — GLUCOSE, CAPILLARY
GLUCOSE-CAPILLARY: 111 mg/dL — AB (ref 65–99)
Glucose-Capillary: 118 mg/dL — ABNORMAL HIGH (ref 65–99)

## 2017-03-11 LAB — CBC
HEMATOCRIT: 36 % (ref 36.0–46.0)
HEMOGLOBIN: 11.3 g/dL — AB (ref 12.0–15.0)
MCH: 30.2 pg (ref 26.0–34.0)
MCHC: 31.4 g/dL (ref 30.0–36.0)
MCV: 96.3 fL (ref 78.0–100.0)
Platelets: 214 10*3/uL (ref 150–400)
RBC: 3.74 MIL/uL — ABNORMAL LOW (ref 3.87–5.11)
RDW: 14 % (ref 11.5–15.5)
WBC: 6.6 10*3/uL (ref 4.0–10.5)

## 2017-03-11 LAB — BASIC METABOLIC PANEL
Anion gap: 7 (ref 5–15)
BUN: 14 mg/dL (ref 6–20)
CALCIUM: 8.9 mg/dL (ref 8.9–10.3)
CHLORIDE: 108 mmol/L (ref 101–111)
CO2: 25 mmol/L (ref 22–32)
CREATININE: 0.86 mg/dL (ref 0.44–1.00)
GFR calc non Af Amer: 60 mL/min — ABNORMAL LOW (ref >60)
GLUCOSE: 100 mg/dL — AB (ref 65–99)
Potassium: 4.3 mmol/L (ref 3.5–5.1)
Sodium: 140 mmol/L (ref 135–145)

## 2017-03-11 LAB — POCT ACTIVATED CLOTTING TIME: ACTIVATED CLOTTING TIME: 274 s

## 2017-03-11 SURGERY — CORONARY STENT INTERVENTION
Anesthesia: LOCAL

## 2017-03-11 MED ORDER — CLOPIDOGREL BISULFATE 75 MG PO TABS
600.0000 mg | ORAL_TABLET | Freq: Once | ORAL | Status: AC
  Administered 2017-03-11: 600 mg via ORAL

## 2017-03-11 MED ORDER — HYDRALAZINE HCL 20 MG/ML IJ SOLN: 5.0000 mg | INTRAMUSCULAR | Status: AC | PRN

## 2017-03-11 MED ORDER — CLOPIDOGREL BISULFATE 75 MG PO TABS
75.0000 mg | ORAL_TABLET | Freq: Every day | ORAL | Status: DC
  Administered 2017-03-12: 75 mg via ORAL
  Filled 2017-03-11: qty 1

## 2017-03-11 MED ORDER — HEPARIN SODIUM (PORCINE) 1000 UNIT/ML IJ SOLN
INTRAMUSCULAR | Status: AC
  Filled 2017-03-11: qty 1

## 2017-03-11 MED ORDER — VERAPAMIL HCL 2.5 MG/ML IV SOLN
INTRAVENOUS | Status: AC
  Filled 2017-03-11: qty 2

## 2017-03-11 MED ORDER — LOPERAMIDE HCL 2 MG PO CAPS: 2.0000 mg | ORAL_CAPSULE | ORAL | Status: DC | PRN

## 2017-03-11 MED ORDER — LIDOCAINE HCL 1 % IJ SOLN
INTRAMUSCULAR | Status: AC
  Filled 2017-03-11: qty 20

## 2017-03-11 MED ORDER — IOPAMIDOL (ISOVUE-370) INJECTION 76%
INTRAVENOUS | Status: DC | PRN
Start: 2017-03-11 — End: 2017-03-11
  Administered 2017-03-11: 100 mL via INTRA_ARTERIAL

## 2017-03-11 MED ORDER — ASPIRIN EC 81 MG PO TBEC
81.0000 mg | DELAYED_RELEASE_TABLET | Freq: Every day | ORAL | Status: DC
  Administered 2017-03-12: 81 mg via ORAL
  Filled 2017-03-11: qty 1

## 2017-03-11 MED ORDER — ALBUTEROL SULFATE (2.5 MG/3ML) 0.083% IN NEBU: 2.5000 mg | INHALATION_SOLUTION | Freq: Two times a day (BID) | RESPIRATORY_TRACT | Status: DC | PRN

## 2017-03-11 MED ORDER — FENTANYL CITRATE (PF) 100 MCG/2ML IJ SOLN
INTRAMUSCULAR | Status: AC
  Filled 2017-03-11: qty 2

## 2017-03-11 MED ORDER — ROSUVASTATIN CALCIUM 10 MG PO TABS
10.0000 mg | ORAL_TABLET | Freq: Every day | ORAL | Status: DC
  Administered 2017-03-11: 10 mg via ORAL
  Filled 2017-03-11: qty 1

## 2017-03-11 MED ORDER — ACETAMINOPHEN 325 MG PO TABS: 650.0000 mg | ORAL_TABLET | ORAL | Status: DC | PRN

## 2017-03-11 MED ORDER — LIDOCAINE HCL (PF) 1 % IJ SOLN
INTRAMUSCULAR | Status: DC | PRN
  Administered 2017-03-11: 2 mL via SUBCUTANEOUS

## 2017-03-11 MED ORDER — SODIUM CHLORIDE 0.9 % WEIGHT BASED INFUSION
1.0000 mL/kg/h | INTRAVENOUS | Status: AC
  Administered 2017-03-11: 16:00:00 1 mL/kg/h via INTRAVENOUS

## 2017-03-11 MED ORDER — METOPROLOL TARTRATE 12.5 MG HALF TABLET
12.5000 mg | ORAL_TABLET | Freq: Two times a day (BID) | ORAL | Status: DC
  Administered 2017-03-11: 12.5 mg via ORAL
  Filled 2017-03-11 (×2): qty 1

## 2017-03-11 MED ORDER — SODIUM CHLORIDE 0.9 % WEIGHT BASED INFUSION: 1.0000 mL/kg/h | INTRAVENOUS | Status: DC

## 2017-03-11 MED ORDER — LABETALOL HCL 5 MG/ML IV SOLN
10.0000 mg | INTRAVENOUS | Status: AC | PRN
  Administered 2017-03-11 (×2): 10 mg via INTRAVENOUS
  Filled 2017-03-11 (×2): qty 4

## 2017-03-11 MED ORDER — HEPARIN SODIUM (PORCINE) 1000 UNIT/ML IJ SOLN
INTRAMUSCULAR | Status: DC | PRN
  Administered 2017-03-11: 7000 [IU] via INTRAVENOUS
  Administered 2017-03-11: 1500 [IU] via INTRAVENOUS

## 2017-03-11 MED ORDER — LORAZEPAM 0.5 MG PO TABS: 0.5000 mg | ORAL_TABLET | Freq: Every evening | ORAL | Status: DC | PRN

## 2017-03-11 MED ORDER — SODIUM CHLORIDE 0.9% FLUSH: 3.0000 mL | Freq: Two times a day (BID) | INTRAVENOUS | Status: DC

## 2017-03-11 MED ORDER — HEPARIN (PORCINE) IN NACL 2-0.9 UNIT/ML-% IJ SOLN
INTRAMUSCULAR | Status: DC | PRN
  Administered 2017-03-11: 1000 mL

## 2017-03-11 MED ORDER — SODIUM CHLORIDE 0.9 % IV SOLN: 250.0000 mL | INTRAVENOUS | Status: DC | PRN

## 2017-03-11 MED ORDER — CLOPIDOGREL BISULFATE 300 MG PO TABS
ORAL_TABLET | ORAL | Status: AC
  Administered 2017-03-11: 600 mg via ORAL
  Filled 2017-03-11: qty 2

## 2017-03-11 MED ORDER — SODIUM CHLORIDE 0.9% FLUSH: 3.0000 mL | INTRAVENOUS | Status: DC | PRN

## 2017-03-11 MED ORDER — MIDAZOLAM HCL 2 MG/2ML IJ SOLN
INTRAMUSCULAR | Status: AC
  Filled 2017-03-11: qty 2

## 2017-03-11 MED ORDER — IOPAMIDOL (ISOVUE-370) INJECTION 76%
INTRAVENOUS | Status: AC
  Filled 2017-03-11: qty 125

## 2017-03-11 MED ORDER — CALCIUM CARBONATE-VITAMIN D 500-200 MG-UNIT PO TABS
1.0000 | ORAL_TABLET | Freq: Two times a day (BID) | ORAL | Status: DC
  Administered 2017-03-11 – 2017-03-12 (×2): 1 via ORAL
  Filled 2017-03-11 (×2): qty 1

## 2017-03-11 MED ORDER — MIDAZOLAM HCL 2 MG/2ML IJ SOLN
INTRAMUSCULAR | Status: DC | PRN
  Administered 2017-03-11 (×3): 1 mg via INTRAVENOUS

## 2017-03-11 MED ORDER — SODIUM CHLORIDE 0.9 % WEIGHT BASED INFUSION
3.0000 mL/kg/h | INTRAVENOUS | Status: DC
  Administered 2017-03-11: 3 mL/kg/h via INTRAVENOUS

## 2017-03-11 MED ORDER — VERAPAMIL HCL 2.5 MG/ML IV SOLN
INTRAVENOUS | Status: DC | PRN
  Administered 2017-03-11: 10 mL via INTRA_ARTERIAL

## 2017-03-11 MED ORDER — ASPIRIN 81 MG PO CHEW
CHEWABLE_TABLET | ORAL | Status: AC
  Administered 2017-03-11: 81 mg via ORAL
  Filled 2017-03-11: qty 1

## 2017-03-11 MED ORDER — ONDANSETRON HCL 4 MG/2ML IJ SOLN: 4.0000 mg | Freq: Four times a day (QID) | INTRAMUSCULAR | Status: DC | PRN

## 2017-03-11 MED ORDER — FENTANYL CITRATE (PF) 100 MCG/2ML IJ SOLN
INTRAMUSCULAR | Status: DC | PRN
  Administered 2017-03-11 (×3): 25 ug via INTRAVENOUS

## 2017-03-11 MED ORDER — HEPARIN (PORCINE) IN NACL 2-0.9 UNIT/ML-% IJ SOLN
INTRAMUSCULAR | Status: AC
  Filled 2017-03-11: qty 1000

## 2017-03-11 MED ORDER — VITAMIN D3 25 MCG (1000 UNIT) PO TABS
1000.0000 [IU] | ORAL_TABLET | Freq: Every day | ORAL | Status: DC
  Administered 2017-03-12: 08:00:00 1000 [IU] via ORAL
  Filled 2017-03-11 (×3): qty 1

## 2017-03-11 MED ORDER — ASPIRIN 81 MG PO CHEW
81.0000 mg | CHEWABLE_TABLET | ORAL | Status: AC
  Administered 2017-03-11: 81 mg via ORAL

## 2017-03-11 SURGICAL SUPPLY — 17 items
BALLN EUPHORA RX 3.0X15 (BALLOONS) ×2
BALLN ~~LOC~~ EMERGE MR 4.5X8 (BALLOONS) ×2
BALLOON EUPHORA RX 3.0X15 (BALLOONS) IMPLANT
BALLOON ~~LOC~~ EMERGE MR 4.5X8 (BALLOONS) IMPLANT
CATH VISTA GUIDE 6FR JR4 SH (CATHETERS) ×1 IMPLANT
DEVICE RAD COMP TR BAND LRG (VASCULAR PRODUCTS) ×1 IMPLANT
GLIDESHEATH SLEND SS 6F .021 (SHEATH) ×1 IMPLANT
GUIDEWIRE INQWIRE 1.5J.035X260 (WIRE) IMPLANT
INQWIRE 1.5J .035X260CM (WIRE) ×2
KIT ENCORE 26 ADVANTAGE (KITS) ×2 IMPLANT
KIT HEART LEFT (KITS) ×2 IMPLANT
PACK CARDIAC CATHETERIZATION (CUSTOM PROCEDURE TRAY) ×2 IMPLANT
STENT SYNERGY DES 4X12 (Permanent Stent) ×1 IMPLANT
TRANSDUCER W/STOPCOCK (MISCELLANEOUS) ×2 IMPLANT
TUBING CIL FLEX 10 FLL-RA (TUBING) ×2 IMPLANT
WIRE COUGAR XT STRL 190CM (WIRE) ×1 IMPLANT
WIRE HI TORQ VERSACORE-J 145CM (WIRE) ×1 IMPLANT

## 2017-03-11 NOTE — H&P (View-Only) (Signed)
Cardiology Office Note Date:  02/15/2017   ID:  Amy Kaiser, DOB 04/29/1929, MRN [id number]  PCP:  Vidal Schwalbe, MD  Cardiologist:  Dr Wynonia Lawman  Chief Complaint  Patient presents with  . TAVR consult     History of Present Illness: Amy Kaiser is a 83 y.o. female who presents for evaluation of severe aortic stenosis, referred by Dr Wynonia Lawman.  The patient is here today with her son, daughter, and grandson. She lives independently and her family members live nearby.  The patient was diagnosed with a heart murmur about one year ago and was referred to Dr. Wynonia Lawman for evaluation. She was noted to have moderate aortic stenosis at the time. She recently returned for annual follow-up and repeat echocardiogram demonstrated progression of her aortic stenosis, now severe with a peak systolic velocity of 5.88 m/s, mean gradient of 43 mmHg, and peak transaortic valve gradient of 72 mmHg. Her LV function is normal with an ejection fraction of 65%. She has no other significant valvular disease.  The patient has had asthma for over 25 years. She describes the severity of her asthma is mild. She's never been hospitalized. She uses an inhaler periodically. Over the last 6 months she has developed progressive dyspnea with exertion. She also describes chest tightness when walking up an incline. This resolves with rest. She's had no lightheadedness or presyncope associated with physical exertion. However, she's had a few spells where she feels pain without warning but has not experienced frank syncope. She denies orthopnea, PND, or leg swelling.  The patient is functionally independent. She no longer drives a car because of her occasional lightheaded spells. She's been retired for approximately 20 years. She still gets out and does some light work in the yard.   Past Medical History:  Diagnosis Date  . Anemia   . Anxiety   . Arthritis   . Asthma   . Cancer ([place])   . Cervical cancer ([place])   .  COPD (chronic obstructive pulmonary disease) ([place])   . Depression   . Diabetes mellitus without complication ([place])   . Hyperlipidemia     Past Surgical History:  Procedure Laterality Date  . ABDOMINAL HYSTERECTOMY    . APPENDECTOMY    . CATARACT EXTRACTION      Current Outpatient Prescriptions  Medication Sig Dispense Refill  . albuterol (ACCUNEB) 0.63 MG/3ML nebulizer solution Take 1 ampule by nebulization every 6 (six) hours as needed for wheezing.    . calcium-vitamin D (OSCAL WITH D) 500-200 MG-UNIT tablet Take 1 tablet by mouth 2 (two) times daily.    . Cholecalciferol (VITAMIN D3 PO) Take 1 tablet by mouth daily.    . CRESTOR 10 MG tablet Take 10 mg by mouth every evening.  12  . FLOVENT HFA 220 MCG/ACT inhaler Take 1 puff by mouth at bedtime as needed. sleep  12  . LORazepam (ATIVAN) 0.5 MG tablet Take 0.5 mg by mouth at bedtime as needed. sleep  2  . Multiple Vitamins-Minerals (ICAPS AREDS 2 PO) Take 1 capsule by mouth 2 (two) times daily.    . VENTOLIN HFA 108 (90 BASE) MCG/ACT inhaler Inhale 2 puffs into the lungs every 4 (four) hours as needed. Wheezing  2   No current facility-administered medications for this visit.     Allergies:   Alendronate sodium; Amoxicillin; Codeine; Latex; Lisinopril; Morphine and related; Penicillins; Pravachol [pravastatin sodium]; Prozac [fluoxetine hcl]; Symbicort [budesonide-formoterol fumarate]; and Wellbutrin [bupropion]   Social History:  The patient  reports that she has never smoked. She has never used smokeless tobacco. She reports that she does not drink alcohol or use drugs.   Family History:  Father died of an MI, brother had 'bad heart'   ROS:  Please see the history of present illness.  Otherwise, review of systems is positive for excessive fatigue, wheezing, anxiety, balance problems, cough.  All other systems are reviewed and negative.    PHYSICAL EXAM: VS:  BP (!) 156/80   Pulse 80   Ht 5\' 6"  (1.676 m)   Wt 184 lb 1.9 oz  (83.5 kg)   BMI 29.72 kg/m  , BMI Body mass index is 29.72 kg/m. GEN: Well nourished, well developed, in no acute distress  HEENT: normal  Neck: no JVD, no masses. No carotid bruits, delayed carotid upstrokes Cardiac: RRR with 3/6 harsh late-peaking systolic murmur at the RUSB, A2 audible but diminished             Respiratory:  clear to auscultation bilaterally, normal work of breathing GI: soft, nontender, nondistended, + BS MS: no deformity or atrophy  Ext: no pretibial edema, pedal pulses 2+= bilaterally Skin: warm and dry, no rash Neuro:  Strength and sensation are intact Psych: euthymic mood, full affect  EKG:  EKG is ordered today. The ekg ordered today shows normal sinus rhythm 81 bpm, left axis deviation, minimal voltage criteria for LVH maybe normal variant.  Recent Labs: 04/27/2016: ALT 14; BUN 13; Creatinine, Ser 1.19; Hemoglobin 11.2; Platelets 199; Potassium 4.0; Sodium 135   Lipid Panel  No results found for: CHOL, TRIG, HDL, CHOLHDL, VLDL, LDLCALC, LDLDIRECT    Wt Readings from Last 3 Encounters:  02/15/17 184 lb 1.9 oz (83.5 kg)     Cardiac Studies Reviewed: 2-D echocardiogram is personally reviewed. Findings as outlined above in history of present illness.  STS RISK CALCULATOR: Procedure: AV Replacement  Risk of Mortality: 4.174%  Morbidity or Mortality: 20.274%  Long Length of Stay: 9.268%  Short Length of Stay: 22.262%  Permanent Stroke: 2.375%  Prolonged Ventilation: 13.51%  DSW Infection: 0.187%  Renal Failure: 5.294%  Reoperation: 7.745%   ASSESSMENT AND PLAN: 83 yo woman with severe symptomatic aortic stenosis (Stage D). I have reviewed the natural history of aortic stenosis with the patient and their family members who are present today. We have discussed the limitations of medical therapy and the poor prognosis associated with symptomatic aortic stenosis. We have reviewed potential treatment options, including palliative medical therapy,  conventional surgical aortic valve replacement, and transcatheter aortic valve replacement. We discussed treatment options in the context of this patient's specific comorbid medical conditions.   She is at moderate risk of conventional aortic valve replacement. I think at her age of 83 years old with moderate risk of surgery based on STS predicted mortality risk of 4.2%, TAVR for might be a reasonable treatment option. I have personally reviewed her echo images which clearly demonstrate thickening, calcification, and leaflet restriction of the aortic valve. Doppler criteria are diagnostic of severe aortic stenosis with a peak velocity in excess of 4 m/s and a mean gradient in excess of 40 mmHg.  I reviewed the necessary evaluation for TAVR, including diagnostic right and left heart catheterization, gated cardiac CT, CTA of the chest/abdomen/pelvis, and formal cardiac surgical evaluation. In addition, the patient will need dental evaluation because of poor dentition with no recent dental care. She has concerns about cost as she has no dental coverage. Will refer her to  Dr. Lawana Chambers.  We discussed the TAVR surgery in detail today. She understands the associated risks and expectations for recovery.   Current medicines are reviewed with the patient today.  The patient does not have concerns regarding medicines.  Labs/ tests ordered today include:   Orders Placed This Encounter  Procedures  . CT CORONARY MORPH W/CTA COR W/SCORE W/CA W/CM &/OR WO/CM  . CT CORONARY FRACTIONAL FLOW RESERVE DATA PREP  . CT CORONARY FRACTIONAL FLOW RESERVE FLUID ANALYSIS  . CT ANGIO CHEST AORTA W/CM &/OR WO/CM  . CT Angio Abd/Pel w/ and/or w/o  . Basic Metabolic Panel (BMET)  . CBC  . INR/PT  . Ambulatory referral to Dentistry  . EKG 12-Lead    Disposition:   As above  Signed, Sherren Mocha, MD  02/15/2017 2:30 PM    [address], [place], [place]  [address] Phone: [phone]; Fax: [phone]

## 2017-03-11 NOTE — Care Management Note (Signed)
Case Management Note  Patient Details  Name: Amy Kaiser MRN: [id number] Date of Birth: 1929-11-01  Subjective/Objective:     s/p coronary stent intervention,will be on plavix.               Action/Plan: NCM will follow for dc needs.  Expected Discharge Date:                  Expected Discharge Plan:     In-House Referral:     Discharge planning Services  CM Consult  Post Acute Care Choice:    Choice offered to:     DME Arranged:    DME Agency:     HH Arranged:    HH Agency:     Status of Service:  In process, will continue to follow  If discussed at Long Length of Stay Meetings, dates discussed:    Additional Comments:  [place], [place], RN 03/11/2017, 3:50 PM

## 2017-03-11 NOTE — Interval H&P Note (Signed)
Cath Lab Visit (complete for each Cath Lab visit)  Clinical Evaluation Leading to the Procedure:   ACS: No.  Non-ACS:    Anginal Classification: CCS III  Anti-ischemic medical therapy: No Therapy  Non-Invasive Test Results: No non-invasive testing performed  Prior CABG: No previous CABG      History and Physical Interval Note:  03/11/2017 1:29 PM  Joetta Manners  has presented today for surgery, with the diagnosis of cad  The various methods of treatment have been discussed with the patient and family. After consideration of risks, benefits and other options for treatment, the patient has consented to  Procedure(s): Coronary Stent Intervention (N/A) as a surgical intervention .  The patient's history has been reviewed, patient examined, no change in status, stable for surgery.  I have reviewed the patient's chart and labs.  Questions were answered to the patient's satisfaction.     Sherren Mocha

## 2017-03-12 ENCOUNTER — Encounter (HOSPITAL_COMMUNITY): Payer: Self-pay | Admitting: Cardiovascular Disease

## 2017-03-12 DIAGNOSIS — E785 Hyperlipidemia, unspecified: Secondary | ICD-10-CM | POA: Diagnosis not present

## 2017-03-12 DIAGNOSIS — Z7902 Long term (current) use of antithrombotics/antiplatelets: Secondary | ICD-10-CM | POA: Diagnosis not present

## 2017-03-12 DIAGNOSIS — I350 Nonrheumatic aortic (valve) stenosis: Secondary | ICD-10-CM | POA: Diagnosis not present

## 2017-03-12 DIAGNOSIS — I10 Essential (primary) hypertension: Secondary | ICD-10-CM | POA: Diagnosis not present

## 2017-03-12 DIAGNOSIS — F329 Major depressive disorder, single episode, unspecified: Secondary | ICD-10-CM | POA: Diagnosis not present

## 2017-03-12 DIAGNOSIS — F419 Anxiety disorder, unspecified: Secondary | ICD-10-CM | POA: Diagnosis not present

## 2017-03-12 DIAGNOSIS — M1990 Unspecified osteoarthritis, unspecified site: Secondary | ICD-10-CM | POA: Diagnosis not present

## 2017-03-12 DIAGNOSIS — E119 Type 2 diabetes mellitus without complications: Secondary | ICD-10-CM | POA: Diagnosis not present

## 2017-03-12 DIAGNOSIS — J449 Chronic obstructive pulmonary disease, unspecified: Secondary | ICD-10-CM | POA: Diagnosis not present

## 2017-03-12 DIAGNOSIS — I25118 Atherosclerotic heart disease of native coronary artery with other forms of angina pectoris: Secondary | ICD-10-CM | POA: Diagnosis not present

## 2017-03-12 LAB — GLUCOSE, CAPILLARY: Glucose-Capillary: 86 mg/dL (ref 65–99)

## 2017-03-12 LAB — BASIC METABOLIC PANEL
ANION GAP: 8 (ref 5–15)
BUN: 13 mg/dL (ref 6–20)
CHLORIDE: 110 mmol/L (ref 101–111)
CO2: 22 mmol/L (ref 22–32)
Calcium: 8.6 mg/dL — ABNORMAL LOW (ref 8.9–10.3)
Creatinine, Ser: 0.79 mg/dL (ref 0.44–1.00)
Glucose, Bld: 98 mg/dL (ref 65–99)
POTASSIUM: 3.9 mmol/L (ref 3.5–5.1)
SODIUM: 140 mmol/L (ref 135–145)

## 2017-03-12 LAB — CBC
HEMATOCRIT: 29.9 % — AB (ref 36.0–46.0)
HEMOGLOBIN: 9.4 g/dL — AB (ref 12.0–15.0)
MCH: 30.2 pg (ref 26.0–34.0)
MCHC: 31.4 g/dL (ref 30.0–36.0)
MCV: 96.1 fL (ref 78.0–100.0)
Platelets: 196 10*3/uL (ref 150–400)
RBC: 3.11 MIL/uL — AB (ref 3.87–5.11)
RDW: 14 % (ref 11.5–15.5)
WBC: 9.3 10*3/uL (ref 4.0–10.5)

## 2017-03-12 MED ORDER — METOPROLOL TARTRATE 25 MG PO TABS: 25.0000 mg | ORAL_TABLET | Freq: Two times a day (BID) | ORAL | 6 refills | Status: DC

## 2017-03-12 MED ORDER — METOPROLOL TARTRATE 25 MG PO TABS
25.0000 mg | ORAL_TABLET | Freq: Two times a day (BID) | ORAL | Status: DC
  Administered 2017-03-12: 25 mg via ORAL
  Filled 2017-03-12: qty 1

## 2017-03-12 MED ORDER — ANGIOPLASTY BOOK
Freq: Once | Status: DC
  Filled 2017-03-12: qty 1

## 2017-03-12 MED ORDER — NITROGLYCERIN 0.4 MG SL SUBL: 0.4000 mg | SUBLINGUAL_TABLET | SUBLINGUAL | 3 refills | Status: DC | PRN

## 2017-03-12 NOTE — Progress Notes (Signed)
Progress Note  Patient Name: Amy Kaiser Date of Encounter: 03/12/2017  Primary Cardiologist: Wynonia Lawman  Subjective   Feels well. No CP or dyspnea.  Inpatient Medications    Scheduled Meds: . angioplasty book   Does not apply Once  . aspirin EC  81 mg Oral Daily  . calcium-vitamin D  1 tablet Oral BID  . cholecalciferol  1,000 Units Oral Daily  . clopidogrel  75 mg Oral Daily  . metoprolol tartrate  12.5 mg Oral BID  . rosuvastatin  10 mg Oral Q supper  . sodium chloride flush  3 mL Intravenous Q12H   Continuous Infusions: . sodium chloride     PRN Meds: sodium chloride, acetaminophen, albuterol, loperamide, LORazepam, ondansetron (ZOFRAN) IV, sodium chloride flush   Vital Signs    Vitals:   03/12/17 0000 03/12/17 0200 03/12/17 0244 03/12/17 0400  BP: (!) 137/52 (!) 139/41 (!) 139/41 (!) 141/55  Pulse: 72 74 76 70  Resp: 17 16 19 16   Temp: 98.1 F (36.7 C)  97.9 F (36.6 C)   TempSrc: Oral  Oral   SpO2: 94% 93% 95% 95%  Weight:   182 lb 15.7 oz (83 kg)   Height:        Intake/Output Summary (Last 24 hours) at 03/12/17 0725 Last data filed at 03/12/17 0000  Gross per 24 hour  Intake           1648.9 ml  Output              200 ml  Net           1448.9 ml   Filed Weights   03/11/17 0959 03/12/17 0244  Weight: 183 lb (83 kg) 182 lb 15.7 oz (83 kg)    ECG    NSR, possible age-indeterminate anterior infarct - Personally Reviewed  Physical Exam  Elderly woman, comfortable GEN: No acute distress.   Neck: No JVD Cardiac: RRR, 3/6 harsh systolic murmur RUSB Respiratory: Clear to auscultation bilaterally. GI: Soft, nontender, non-distended  MS: No edema; No deformity. Neuro:  Nonfocal  Psych: Normal affect   Labs    Chemistry Recent Labs Lab 03/11/17 1046 03/12/17 0232  NA 140 140  K 4.3 3.9  CL 108 110  CO2 25 22  GLUCOSE 100* 98  BUN 14 13  CREATININE 0.86 0.79  CALCIUM 8.9 8.6*  GFRNONAA 60* >60  GFRAA >60 >60  ANIONGAP 7 8      Hematology Recent Labs Lab 03/11/17 1046 03/12/17 0232  WBC 6.6 9.3  RBC 3.74* 3.11*  HGB 11.3* 9.4*  HCT 36.0 29.9*  MCV 96.3 96.1  MCH 30.2 30.2  MCHC 31.4 31.4  RDW 14.0 14.0  PLT 214 196    Cardiac EnzymesNo results for input(s): TROPONINI in the last 168 hours. No results for input(s): TROPIPOC in the last 168 hours.   BNPNo results for input(s): BNP, PROBNP in the last 168 hours.   DDimer No results for input(s): DDIMER in the last 168 hours.   Radiology    No results found.   Patient Profile     83 y.o. female with severe ostial RCA stenosis s/p elective PCI 03/11/17, planned TAVR for treatment of severe symptomatic aortic stenosis June 2018  Assessment & Plan    1. Exertional angina - s/p PCI of the RCA with a drug-eluting stent. Continue ASA, plavix.   2. Severe, Stage D, aortic stenosis: plans for TAVR scheduled early June. She will continue on DAPT  through the perioperative period.  3. HTN - increase metoprolol to 25 mg BID  4. Hyperlipidemia - continue crestor  Signed, Sherren Mocha, MD  03/12/2017, 7:25 AM

## 2017-03-12 NOTE — Care Management Note (Signed)
Case Management Note  Patient Details  Name: DANN VENTRESS MRN: [id number] Date of Birth: Mar 06, 1929  Subjective/Objective:   From home , pta indep, s/p coronary stent intervention, will be on plavix.  For dc today.                 Action/Plan:   Expected Discharge Date:  03/12/17               Expected Discharge Plan:  Home/Self Care  In-House Referral:     Discharge planning Services  CM Consult  Post Acute Care Choice:    Choice offered to:     DME Arranged:    DME Agency:     HH Arranged:    HH Agency:     Status of Service:  Completed, signed off  If discussed at [employer] of Stay Meetings, dates discussed:    Additional Comments:  Clarissia, Mckeen, RN 03/12/2017, 9:26 AM

## 2017-03-12 NOTE — Discharge Summary (Signed)
Discharge Summary    Patient ID: Amy Kaiser,  MRN: [id number], DOB/AGE: 81/01/1929 81 y.o.  Admit date: 03/11/2017 Discharge date: 03/12/2017  Primary Care Provider: Harlan Stains Primary Cardiologist: Wynonia Lawman  Discharge Diagnoses    Active Problems:   Coronary artery disease with exertional angina [facility])   Allergies Allergies  Allergen Reactions  . Alendronate Sodium     Doesn't recall   . Codeine Nausea And Vomiting  . Latex Hives and Itching    Caused blisters in her mouth  . Lisinopril     Weakness   . Morphine And Related Nausea And Vomiting  . Penicillins Other (See Comments)    Intolerance to ALL "CILLINS" Has patient had a PCN reaction causing immediate rash, facial/tongue/throat swelling, SOB or lightheadedness with hypotension: unknown Has patient had a PCN reaction causing severe rash involving mucus membranes or skin necrosis: unknown Has patient had a PCN reaction that required hospitalization unknown Has patient had a PCN reaction occurring within the last 10 years: unknown If all of the above answers are "NO", then may proceed with Cephalosporin use.   Christy Sartorius Sodium] Other (See Comments)    Leg pain   . Prozac [Fluoxetine Hcl]     Feels shaky   . Symbicort [Budesonide-Formoterol Fumarate]     shakey   . Wellbutrin [Bupropion]     Jittery     Diagnostic Studies/Procedures    Cath: 03/11/17  Conclusion   Final conclusion:   Successful PCI of severe stenosis at the ostium of the RCA, treated with a 4.0 x 12 mm Synergy DES.   _____________   History of Present Illness     Amy Kaiser is a 83 y.o. female who presented for evaluation of severe aortic stenosis, referred by Dr Wynonia Lawman.  She lives independently and her family members live nearby.  The patient was diagnosed with a heart murmur about one year ago and was referred to Dr. Wynonia Lawman for evaluation. She was noted to have moderate aortic stenosis at the time.  She recently returned for annual follow-up and repeat echocardiogram demonstrated progression of her aortic stenosis, now severe with a peak systolic velocity of 4.97 m/s, mean gradient of 43 mmHg, and peak transaortic valve gradient of 72 mmHg. Her LV function is normal with an ejection fraction of 65%. She has no other significant valvular disease.  The patient has had asthma for over 25 years. She describes the severity of her asthma is mild. She's never been hospitalized. She uses an inhaler periodically. Over the last 6 months she has developed progressive dyspnea with exertion. She also described chest tightness when walking up an incline. This resolved with rest. She's had no lightheadedness or presyncope associated with physical exertion. However, she's had a few spells where she feels pain without warning but has not experienced frank syncope. She denied orthopnea, PND, or leg swelling.  The patient is functionally independent. She no longer drives a car because of her occasional lightheaded spells. She's been retired for approximately 20 years. She still gets out and does some light work in the yard. At this last visit with Dr. Burt Knack the option of TAVR was discussed and she agreed to proceed forward with preop evaluation, and cardiac cath given her recent symptoms.   Hospital Course     She underwent cardiac cath on 03/11/17 with Dr. Burt Knack noted above with DES x1 to the RCA. Plan for DAPT with ASA and Plavix. She felt well post  cath. Was noted to be hypertensive, therefore her metoprolol was increased 25mg  BID. Will be continued on Crestor. She has follow up appts arranged to continue her TAVR work up. Surgery planned for early June. Morning labs were stable.   She was seen by Dr. Burt Knack on 03/12/17 and determined stable for discharge home. Follow up is noted. Medications are listed below.  _____________  Discharge Vitals Blood pressure (!) 169/73, pulse 73, temperature 97.9 F (36.6 C),  temperature source Oral, resp. rate (!) 21, height 5\' 6"  (1.676 m), weight 182 lb 15.7 oz (83 kg), SpO2 94 %.  Filed Weights   03/11/17 0959 03/12/17 0244  Weight: 183 lb (83 kg) 182 lb 15.7 oz (83 kg)    Labs & Radiologic Studies    CBC  Recent Labs  03/11/17 1046 03/12/17 0232  WBC 6.6 9.3  HGB 11.3* 9.4*  HCT 36.0 29.9*  MCV 96.3 96.1  PLT 214 952   Basic Metabolic Panel  Recent Labs  03/11/17 1046 03/12/17 0232  NA 140 140  K 4.3 3.9  CL 108 110  CO2 25 22  GLUCOSE 100* 98  BUN 14 13  CREATININE 0.86 0.79  CALCIUM 8.9 8.6*   Liver Function Tests No results for input(s): AST, ALT, ALKPHOS, BILITOT, PROT, ALBUMIN in the last 72 hours. No results for input(s): LIPASE, AMYLASE in the last 72 hours. Cardiac Enzymes No results for input(s): CKTOTAL, CKMB, CKMBINDEX, TROPONINI in the last 72 hours. BNP Invalid input(s): POCBNP D-Dimer No results for input(s): DDIMER in the last 72 hours. Hemoglobin A1C No results for input(s): HGBA1C in the last 72 hours. Fasting Lipid Panel No results for input(s): CHOL, HDL, LDLCALC, TRIG, CHOLHDL, LDLDIRECT in the last 72 hours. Thyroid Function Tests No results for input(s): TSH, T4TOTAL, T3FREE, THYROIDAB in the last 72 hours.  Invalid input(s): FREET3 _____________  Ir US Guide Vasc Access Right  Result Date: 02/26/2017 INDICATION: 83 year old female with IV requirement for CT scan. Aortic stenosis EXAM: Right PICC LINE PLACEMENT WITH ULTRASOUND AND FLUOROSCOPIC GUIDANCE MEDICATIONS: None ANESTHESIA/SEDATION: None FLUOROSCOPY TIME:  None COMPLICATIONS: None PROCEDURE: Informed written consent was obtained from the patient after a thorough discussion of the procedural risks, benefits and alternatives. All questions were addressed. Sterile Barrier Technique was utilized including caps, mask, sterile gloves, sterile drape, hand hygiene and skin antiseptic. A timeout was performed prior to the initiation of the procedure.  Ultrasound survey of the upper extremity was performed with images stored and sent to PACs. A micropuncture needle was used access the right basilic vein under ultrasound. With venous blood flow returned, an .018 micro wire was passed through the needle into the vein. The needle was removed, and a micropuncture sheath was placed over the wire. The inner dilator and wire were removed, and the catheter was secured in position after attaching to saline flush. Patient tolerated the procedure well and remained hemodynamically stable throughout. No complications were encountered and no significant blood loss. IMPRESSION: Status post ultrasound-guided right basilic vein access for IV usage. Ready for use. Signed, Dulcy Fanny. Earleen Newport, DO [facility] Electronically Signed   By: Corrie Mckusick D.O.   On: 02/26/2017 12:14   Ct Coronary Morph W/cta Cor W/score W/ca W/cm &/or Wo/cm  Addendum Date: 02/28/2017   ADDENDUM REPORT: 02/28/2017 16:26 CLINICAL DATA:  83 year old female with severe aortic stenosis and suspicion for severe RCA stenosis on cardiac catheterization. Evaluate severity of RCA stenosis and evaluate for TAVR. EXAM: Cardiac  TAVR CT TECHNIQUE: The patient was scanned on a Philips 256 scanner. A 120 kV retrospective scan was triggered in the descending thoracic aorta at 111 HU's. Gantry rotation speed was 270 msecs and collimation was .9 mm. 10 mg of iv metoprolol or 0.4 mg of sl NTG were given. The 3D data set was reconstructed in 5% intervals of the R-R cycle. Systolic and diastolic phases were analyzed on a dedicated work station using MPR, MIP and VRT modes. The patient received 80 cc of contrast. FINDINGS: Aortic Valve: Trileaflet, moderately thickened, mildly calcified with no calcium extending into LVOT. Aorta:  Mild diffuse calcifications.  No dissection. Sinotubular Junction:  23 x 22 mm Ascending Thoracic Aorta:  29 x 28 mm Aortic Arch:  25 x 24 mm  Descending Thoracic Aorta:  20 x 19 mm Sinus of Valsalva Measurements: Non-coronary:  24 mm Right -coronary:  22 mm Left -coronary:  25 mm Coronary Artery Height above Annulus: Left Main:  12 mm Right Coronary:  13 mm Virtual Basal Annulus Measurements: Maximum/Minimum Diameter:  23 x 17 mm Perimeter:  70 mm Area:  297 mm2 Coronary Arteries:  Normal origin, right dominance. RCA is a very large dominant artery giving rise to PDA and PLVB. There is a severe mixed, predominantly noncalcified plaque in the ostial RCA suspicious for > 70 stenosis. Minimal plaque in the remaining RCA. LM is a large long vessel with no plaque. LAD is a large vessel that gives rise to one diagonal branch. There is a long calcific lesion in the proximal LAD extending into diagonal branch associated with 25-50% stenosis. LCX is a small non-dominant vessel that has only minimal noncalcified plaque. Optimum Fluoroscopic Angle for Delivery:  LAO 8, CAU 9 IMPRESSION: 1. Trileaflet, moderately thickened, mildly calcified aortic valve with no calcium extending into LVOT. Annular measurements suitable for delivery of a 23 mm Edwards-SAPIEN 3 valve. 2. Sufficient annulus to coronary distance. 3. Optimum Fluoroscopic Angle for Delivery: LAO 8, CAU 9 4. There is a severe mixed, predominantly noncalcified plaque in the ostial RCA suspicious for > 70 stenosis. Please see attachment for full CT FFR analysis. Ostial RCA FFR 0.65. Revascularization is recommended. Ena Dawley Electronically Signed   By: Ena Dawley   On: 02/28/2017 16:26   Result Date: 02/28/2017 EXAM: OVER-READ INTERPRETATION  CT CHEST The following report is an over-read performed by radiologist Dr. Rebekah Chesterfield [facility] on 02/26/2017. This over-read does not include interpretation of cardiac or coronary anatomy or pathology. The coronary calcium score/coronary CTA interpretation by the cardiologist is attached. COMPARISON:  Chest CT 07/11/2010. FINDINGS:  Extracardiac findings will be fully described under separate dictation for contemporaneously obtained CTA of the chest, abdomen and pelvis 02/26/2017. IMPRESSION: Please see separate dictation for contemporaneously obtained CTA of the chest, abdomen and pelvis 02/26/2017 for full description of extracardiac findings. Electronically Signed: By: Vinnie Langton M.D. On: 02/26/2017 13:58   Ct Coronary Fractional Flow Reserve Data Prep  Addendum Date: 02/28/2017   ADDENDUM REPORT: 02/28/2017 15:42 EXAM: FF/RCT ANALYSIS FINDINGS: FFRct analysis was performed on the original cardiac CT angiogram dataset. Diagrammatic representation of the FFRct analysis is provided in a separate PDF document in PACS. This dictation was created using the PDF document and an interactive 3D model of the results. 3D model is not available in the EMR/PACS. Normal FFR range is >0.80. 1. Left Main:  No significant stenosis. 2. LAD: No significant stenosis in the proximal and mid segment, FFR 0.75 in the very  distal portion of LAD. 3. LCX: No significant stenosis. 4. RCA: There is significant stenosis in the ostial RCA with FFR 0.65. IMPRESSION: 1. CT FFR analysis show significant stenosis in the ostial RCA (FFR 0.65) correlating well with the anatomical findings. Electronically Signed   By: Ena Dawley   On: 02/28/2017 15:42   Result Date: 02/28/2017 EXAM: OVER-READ INTERPRETATION  CT CHEST The following report is an over-read performed by radiologist Dr. Rebekah Chesterfield [facility] [place] on 02/26/2017. This over-read does not include interpretation of cardiac or coronary anatomy or pathology. The coronary calcium score/coronary CTA interpretation by the cardiologist is attached. COMPARISON:  Chest CT 07/11/2010. FINDINGS: Extracardiac findings will be fully described under separate dictation for contemporaneously obtained CTA of the chest, abdomen and pelvis 02/26/2017. IMPRESSION: Please see separate dictation for  contemporaneously obtained CTA of the chest, abdomen and pelvis 02/26/2017 for full description of extracardiac findings. Electronically Signed: By: Vinnie Langton M.D. On: 02/26/2017 13:58   Ct Coronary Fractional Flow Reserve Fluid Analysis  Addendum Date: 02/28/2017   ADDENDUM REPORT: 02/28/2017 15:42 EXAM: FF/RCT ANALYSIS FINDINGS: FFRct analysis was performed on the original cardiac CT angiogram dataset. Diagrammatic representation of the FFRct analysis is provided in a separate PDF document in PACS. This dictation was created using the PDF document and an interactive 3D model of the results. 3D model is not available in the EMR/PACS. Normal FFR range is >0.80. 1. Left Main:  No significant stenosis. 2. LAD: No significant stenosis in the proximal and mid segment, FFR 0.75 in the very distal portion of LAD. 3. LCX: No significant stenosis. 4. RCA: There is significant stenosis in the ostial RCA with FFR 0.65. IMPRESSION: 1. CT FFR analysis show significant stenosis in the ostial RCA (FFR 0.65) correlating well with the anatomical findings. Electronically Signed   By: Ena Dawley   On: 02/28/2017 15:42   Result Date: 02/28/2017 EXAM: OVER-READ INTERPRETATION  CT CHEST The following report is an over-read performed by radiologist Dr. Rebekah Chesterfield [facility] [place] on 02/26/2017. This over-read does not include interpretation of cardiac or coronary anatomy or pathology. The coronary calcium score/coronary CTA interpretation by the cardiologist is attached. COMPARISON:  Chest CT 07/11/2010. FINDINGS: Extracardiac findings will be fully described under separate dictation for contemporaneously obtained CTA of the chest, abdomen and pelvis 02/26/2017. IMPRESSION: Please see separate dictation for contemporaneously obtained CTA of the chest, abdomen and pelvis 02/26/2017 for full description of extracardiac findings. Electronically Signed: By: Vinnie Langton M.D. On: 02/26/2017 13:58   Ct  Angio Chest Aorta W/cm &/or Wo/cm  Result Date: 02/26/2017 CLINICAL DATA:  83 year old female with history of severe aortic stenosis. Preprocedural study prior to potential transcatheter aortic valve replacement (TAVR) procedure. EXAM: CT ANGIOGRAPHY CHEST, ABDOMEN AND PELVIS TECHNIQUE: Multidetector CT imaging through the chest, abdomen and pelvis was performed using the standard protocol during bolus administration of intravenous contrast. Multiplanar reconstructed images and MIPs were obtained and reviewed to evaluate the vascular anatomy. CONTRAST:  80 mL of Isovue 370. COMPARISON:  Chest CT 07/11/2010. CT the abdomen and pelvis 10/24/2009. FINDINGS: CTA CHEST FINDINGS Cardiovascular: Heart size is normal. There is no significant pericardial fluid, thickening or pericardial calcification. There is aortic atherosclerosis, as well as atherosclerosis of the great vessels of the mediastinum and the coronary arteries, including calcified atherosclerotic plaque in the left anterior descending coronary artery. Thickening calcification of the aortic valve, compatible with the reported clinical history of severe aortic stenosis. Mediastinum/Lymph Nodes: No pathologically enlarged mediastinal  or hilar lymph nodes. Small hiatal hernia. No axillary lymphadenopathy. Lungs/Pleura: No acute consolidative airspace disease. No pleural effusions. No suspicious appearing pulmonary nodules or masses. Mild linear scarring in the lung bases bilaterally. Musculoskeletal/Soft Tissues: There are no aggressive appearing lytic or blastic lesions noted in the visualized portions of the skeleton. CTA ABDOMEN AND PELVIS FINDINGS Hepatobiliary: No cystic or solid hepatic lesions. No intra or extrahepatic biliary ductal dilatation. Gallbladder is normal in appearance. Pancreas: No pancreatic mass. No pancreatic ductal dilatation. No pancreatic or peripancreatic fluid or inflammatory changes. Spleen: Unremarkable. Adrenals/Urinary Tract:  Multiple tiny 2-3 mm low-attenuation lesions in both kidneys are too small to characterize, but are statistically likely to represent tiny cysts. Bilateral adrenal glands are normal in appearance. No hydroureteronephrosis. Urinary bladder is normal in appearance. Stomach/Bowel: The appearance of the stomach is normal. No pathologic dilatation of small bowel or colon. Numerous colonic diverticulae are noted, particularly in the sigmoid colon, without surrounding inflammatory changes to suggest an acute diverticulitis at this time. Normal appendix. Vascular/Lymphatic: Aortic atherosclerosis, without evidence of aneurysm or dissection in the abdominal or pelvic vasculature. Vascular measurements pertinent to potential TAVR procedure, as detailed below. The celiac axis, superior mesenteric artery and inferior mesenteric artery are all widely patent without hemodynamically significant stenosis. Single renal arteries bilaterally are widely patent. No lymphadenopathy noted in the abdomen or pelvis. Reproductive: Status post hysterectomy. Ovaries are not confidently identified may be surgically absent or atrophic. Other: No significant volume of ascites.  No pneumoperitoneum. Musculoskeletal: There are no aggressive appearing lytic or blastic lesions noted in the visualized portions of the skeleton. VASCULAR MEASUREMENTS PERTINENT TO TAVR: AORTA: Minimal Aortic Diameter -  11 x 11 mm Severity of Aortic Calcification -  moderate RIGHT PELVIS: Right Common Iliac Artery - Minimal Diameter - 7.9 x 8.7 mm Tortuosity - mild Calcification - mild Right External Iliac Artery - Minimal Diameter - 5.9 x 6.1 mm Tortuosity - mild Calcification - none Right Common Femoral Artery - Minimal Diameter - 6.3 x 6.8 mm Tortuosity - mild Calcification - none LEFT PELVIS: Left Common Iliac Artery - Minimal Diameter - 8.2 x 7.7 mm Tortuosity - mild Calcification - none Left External Iliac Artery - Minimal Diameter - 6.2 x 6.2 mm Tortuosity - mild  Calcification - none Left Common Femoral Artery - Minimal Diameter - 6.3 x 7.0 mm Tortuosity - mild Calcification - none Review of the MIP images confirms the above findings. IMPRESSION: 1. Vascular findings and measurements pertinent to potential TAVR procedure, as detailed above. This patient has suitable pelvic arterial access bilaterally. 2. Thickening calcification of the aortic valve, compatible with the reported clinical history of severe aortic stenosis. 3. Aortic atherosclerosis, in addition to left anterior descending coronary artery disease. 4. Small hiatal hernia. 5. Colonic diverticulosis without evidence of acute diverticulitis at this time. 6. Additional incidental findings, as above. Electronically Signed   By: Vinnie Langton M.D.   On: 02/26/2017 14:44   [facility] Radiology Peripheral Guided Iv Start  Result Date: 02/26/2017 INDICATION: 83 year old female with IV requirement for CT scan. Aortic stenosis EXAM: Right PICC LINE PLACEMENT WITH ULTRASOUND AND FLUOROSCOPIC GUIDANCE MEDICATIONS: None ANESTHESIA/SEDATION: None FLUOROSCOPY TIME:  None COMPLICATIONS: None PROCEDURE: Informed written consent was obtained from the patient after a thorough discussion of the procedural risks, benefits and alternatives. All questions were addressed. Sterile Barrier Technique was utilized including caps, mask, sterile gloves, sterile drape, hand hygiene and skin antiseptic. A timeout was performed prior to the initiation of the procedure.  Ultrasound survey of the upper extremity was performed with images stored and sent to PACs. A micropuncture needle was used access the right basilic vein under ultrasound. With venous blood flow returned, an .018 micro wire was passed through the needle into the vein. The needle was removed, and a micropuncture sheath was placed over the wire. The inner dilator and wire were removed, and the catheter was secured in position after attaching to saline flush. Patient tolerated the  procedure well and remained hemodynamically stable throughout. No complications were encountered and no significant blood loss. IMPRESSION: Status post ultrasound-guided right basilic vein access for IV usage. Ready for use. Signed, Dulcy Fanny. Earleen Newport, DO [facility] Electronically Signed   By: Corrie Mckusick D.O.   On: 02/26/2017 12:14   Ct Angio Abd/pel W/ And/or W/o  Result Date: 02/26/2017 CLINICAL DATA:  83 year old female with history of severe aortic stenosis. Preprocedural study prior to potential transcatheter aortic valve replacement (TAVR) procedure. EXAM: CT ANGIOGRAPHY CHEST, ABDOMEN AND PELVIS TECHNIQUE: Multidetector CT imaging through the chest, abdomen and pelvis was performed using the standard protocol during bolus administration of intravenous contrast. Multiplanar reconstructed images and MIPs were obtained and reviewed to evaluate the vascular anatomy. CONTRAST:  80 mL of Isovue 370. COMPARISON:  Chest CT 07/11/2010. CT the abdomen and pelvis 10/24/2009. FINDINGS: CTA CHEST FINDINGS Cardiovascular: Heart size is normal. There is no significant pericardial fluid, thickening or pericardial calcification. There is aortic atherosclerosis, as well as atherosclerosis of the great vessels of the mediastinum and the coronary arteries, including calcified atherosclerotic plaque in the left anterior descending coronary artery. Thickening calcification of the aortic valve, compatible with the reported clinical history of severe aortic stenosis. Mediastinum/Lymph Nodes: No pathologically enlarged mediastinal or hilar lymph nodes. Small hiatal hernia. No axillary lymphadenopathy. Lungs/Pleura: No acute consolidative airspace disease. No pleural effusions. No suspicious appearing pulmonary nodules or masses. Mild linear scarring in the lung bases bilaterally. Musculoskeletal/Soft Tissues: There are no aggressive appearing lytic or blastic lesions  noted in the visualized portions of the skeleton. CTA ABDOMEN AND PELVIS FINDINGS Hepatobiliary: No cystic or solid hepatic lesions. No intra or extrahepatic biliary ductal dilatation. Gallbladder is normal in appearance. Pancreas: No pancreatic mass. No pancreatic ductal dilatation. No pancreatic or peripancreatic fluid or inflammatory changes. Spleen: Unremarkable. Adrenals/Urinary Tract: Multiple tiny 2-3 mm low-attenuation lesions in both kidneys are too small to characterize, but are statistically likely to represent tiny cysts. Bilateral adrenal glands are normal in appearance. No hydroureteronephrosis. Urinary bladder is normal in appearance. Stomach/Bowel: The appearance of the stomach is normal. No pathologic dilatation of small bowel or colon. Numerous colonic diverticulae are noted, particularly in the sigmoid colon, without surrounding inflammatory changes to suggest an acute diverticulitis at this time. Normal appendix. Vascular/Lymphatic: Aortic atherosclerosis, without evidence of aneurysm or dissection in the abdominal or pelvic vasculature. Vascular measurements pertinent to potential TAVR procedure, as detailed below. The celiac axis, superior mesenteric artery and inferior mesenteric artery are all widely patent without hemodynamically significant stenosis. Single renal arteries bilaterally are widely patent. No lymphadenopathy noted in the abdomen or pelvis. Reproductive: Status post hysterectomy. Ovaries are not confidently identified may be surgically absent or atrophic. Other: No significant volume of ascites.  No pneumoperitoneum. Musculoskeletal: There are no aggressive appearing lytic or blastic lesions noted in the visualized portions of the skeleton. VASCULAR MEASUREMENTS PERTINENT TO TAVR: AORTA: Minimal Aortic Diameter -  11 x 11 mm Severity of Aortic Calcification -  moderate RIGHT  PELVIS: Right Common Iliac Artery - Minimal Diameter - 7.9 x 8.7 mm Tortuosity - mild Calcification -  mild Right External Iliac Artery - Minimal Diameter - 5.9 x 6.1 mm Tortuosity - mild Calcification - none Right Common Femoral Artery - Minimal Diameter - 6.3 x 6.8 mm Tortuosity - mild Calcification - none LEFT PELVIS: Left Common Iliac Artery - Minimal Diameter - 8.2 x 7.7 mm Tortuosity - mild Calcification - none Left External Iliac Artery - Minimal Diameter - 6.2 x 6.2 mm Tortuosity - mild Calcification - none Left Common Femoral Artery - Minimal Diameter - 6.3 x 7.0 mm Tortuosity - mild Calcification - none Review of the MIP images confirms the above findings. IMPRESSION: 1. Vascular findings and measurements pertinent to potential TAVR procedure, as detailed above. This patient has suitable pelvic arterial access bilaterally. 2. Thickening calcification of the aortic valve, compatible with the reported clinical history of severe aortic stenosis. 3. Aortic atherosclerosis, in addition to left anterior descending coronary artery disease. 4. Small hiatal hernia. 5. Colonic diverticulosis without evidence of acute diverticulitis at this time. 6. Additional incidental findings, as above. Electronically Signed   By: Vinnie Langton M.D.   On: 02/26/2017 14:44   Disposition   Pt is being discharged home today in good condition.  Follow-up Plans & Appointments    Follow-up Information    Jacolyn Reedy, MD Follow up.   Specialty:  Cardiology Why:  Please call and make an appt to see Dr. Wynonia Lawman within the next 2 weeks.  Contact information: [address] [place] [address] [phone]          Discharge Instructions    Call MD for:  redness, tenderness, or signs of infection (pain, swelling, redness, odor or green/yellow discharge around incision site)    Complete by:  As directed    Diet - low sodium heart healthy    Complete by:  As directed    Discharge instructions    Complete by:  As directed    Radial Site Care Refer to this sheet in the next few weeks. These  instructions provide you with information on caring for yourself after your procedure. Your caregiver may also give you more specific instructions. Your treatment has been planned according to current medical practices, but problems sometimes occur. Call your caregiver if you have any problems or questions after your procedure. HOME CARE INSTRUCTIONS You may shower the day after the procedure.Remove the bandage (dressing) and gently wash the site with plain soap and water.Gently pat the site dry.  Do not apply powder or lotion to the site.  Do not submerge the affected site in water for 3 to 5 days.  Inspect the site at least twice daily.  Do not flex or bend the affected arm for 24 hours.  No lifting over 5 pounds (2.3 kg) for 5 days after your procedure.  Do not drive home if you are discharged the same day of the procedure. Have someone else drive you.  You may drive 24 hours after the procedure unless otherwise instructed by your caregiver.  What to expect: Any bruising will usually fade within 1 to 2 weeks.  Blood that collects in the tissue (hematoma) may be painful to the touch. It should usually decrease in size and tenderness within 1 to 2 weeks.  SEEK IMMEDIATE MEDICAL CARE IF: You have unusual pain at the radial site.  You have redness, warmth, swelling, or pain at the radial site.  You have drainage (other than a small amount of blood on the dressing).  You have chills.  You have a fever or persistent symptoms for more than 72 hours.  You have a fever and your symptoms suddenly get worse.  Your arm becomes pale, cool, tingly, or numb.  You have heavy bleeding from the site. Hold pressure on the site.   Increase activity slowly    Complete by:  As directed       Discharge Medications   Current Discharge Medication List    START taking these medications   Details  metoprolol tartrate (LOPRESSOR) 25 MG tablet Take 1 tablet (25 mg total) by mouth 2 (two) times daily. Qty:  60 tablet, Refills: 6    nitroGLYCERIN (NITROSTAT) 0.4 MG SL tablet Place 1 tablet (0.4 mg total) under the tongue every 5 (five) minutes as needed. Qty: 25 tablet, Refills: 3      CONTINUE these medications which have NOT CHANGED   Details  aspirin 81 MG chewable tablet Chew 81 mg by mouth daily.     calcium-vitamin D (OSCAL WITH D) 500-200 MG-UNIT tablet Take 1 tablet by mouth 2 (two) times daily.    Cholecalciferol (VITAMIN D3) 1000 units CAPS Take 1,000 Units by mouth daily.    FLOVENT HFA 220 MCG/ACT inhaler Take 1 puff by mouth at bedtime as needed (wheezing). sleep Refills: 12    loperamide (IMODIUM A-D) 2 MG tablet Take 2 mg by mouth as needed for diarrhea or loose stools.    LORazepam (ATIVAN) 0.5 MG tablet Take 0.5 mg by mouth at bedtime as needed for sleep.  Refills: 2    Multiple Vitamins-Minerals (ICAPS AREDS 2 PO) Take 1 capsule by mouth 2 (two) times daily.    rosuvastatin (CRESTOR) 10 MG tablet Take 10 mg by mouth daily with supper.     VENTOLIN HFA 108 (90 BASE) MCG/ACT inhaler Inhale 2 puffs into the lungs 2 (two) times daily as needed for wheezing or shortness of breath.  Refills: 2    clopidogrel (PLAVIX) 75 MG tablet Take 1 tablet (75 mg total) by mouth daily. Qty: 30 tablet, Refills: 11      STOP taking these medications     clindamycin (CLEOCIN) 150 MG capsule          Aspirin prescribed at discharge?  Yes High Intensity Statin Prescribed? (Lipitor 40-80mg  or Crestor 20-40mg ): Yes Beta Blocker Prescribed? Yes For EF <40%, was ACEI/ARB Prescribed? No: EF ok, consider as outpatient.  ADP Receptor Inhibitor Prescribed? (i.e. Plavix etc.-Includes Medically Managed Patients): Yes For EF <40%, Aldosterone Inhibitor Prescribed? No: EF ok Was EF assessed during THIS hospitalization? Yes Was Cardiac Rehab II ordered? (Included Medically managed Patients): Yes   Outstanding Labs/Studies   Keep scheduled follow up TAVR appts.   Duration of Discharge  Encounter   Greater than 30 minutes including physician time.  Signed, Reino Bellis NP-C 03/12/2017, 8:27 AM

## 2017-03-12 NOTE — Progress Notes (Signed)
CARDIAC REHAB PHASE I   PRE:  Rate/Rhythm: 79 SR  BP:  Supine: 160/57  Sitting:   Standing:    SaO2:   MODE:  Ambulation: 475 ft   POST:  Rate/Rhythm: 85 SR  BP:  Supine:   Sitting: 169/73  Standing:    SaO2: 97%RA 0800-0845 Pt walked 475 ft on RA with hand held asst with steady gait. No CP. Tolerated well. Stressed importance of plavix with stent. Reviewed NTG use, heart healthy diet watching sodium, and walking as tolerated for ex. Told pt we will discuss CRP 2 more after TAVR as she is to return in June. Will refer to program at that time.    Graylon Good, RN BSN  03/12/2017 8:39 AM

## 2017-03-13 ENCOUNTER — Ambulatory Visit (HOSPITAL_COMMUNITY): Payer: Self-pay | Admitting: Dentistry

## 2017-03-13 ENCOUNTER — Encounter (HOSPITAL_COMMUNITY): Payer: Self-pay | Admitting: Dentistry

## 2017-03-13 VITALS — BP 149/54 | HR 68 | Temp 97.8°F

## 2017-03-13 DIAGNOSIS — Z01818 Encounter for other preprocedural examination: Secondary | ICD-10-CM

## 2017-03-13 DIAGNOSIS — I350 Nonrheumatic aortic (valve) stenosis: Secondary | ICD-10-CM

## 2017-03-13 DIAGNOSIS — K08199 Complete loss of teeth due to other specified cause, unspecified class: Secondary | ICD-10-CM

## 2017-03-13 DIAGNOSIS — K029 Dental caries, unspecified: Secondary | ICD-10-CM

## 2017-03-13 DIAGNOSIS — K0530 Chronic periodontitis, unspecified: Secondary | ICD-10-CM

## 2017-03-13 NOTE — Patient Instructions (Signed)
PLAN: 1. Continue salt water rinses as needed to aid healing. 2. Brush teeth after meals and at bedtime. Floss at bedtime. 3. Patient is currently cleared for heart valve surgery as indicated. 4. Patient to follow-up with a dentist of her choice for restoration of dental caries and evaluation for replacement of missing teeth as indicated once medically stable from the anticipated heart valve surgery. Patient is aware that she will need antibiotic premedication prior to invasive dental procedures per American Heart Association guidelines.     Lenn Cal, DDS

## 2017-03-13 NOTE — Progress Notes (Signed)
POST OPERATIVE NOTE:  03/13/2017 Amy Kaiser [id number]  VITALS: BP (!) 149/54 (BP Location: Left Arm)   Pulse 68   Temp 97.8 F (36.6 C) (Oral)   LABS:  Lab Results  Component Value Date   WBC 9.3 03/12/2017   HGB 9.4 (L) 03/12/2017   HCT 29.9 (L) 03/12/2017   MCV 96.1 03/12/2017   PLT 196 03/12/2017   BMET    Component Value Date/Time   NA 140 03/12/2017 0232   NA 143 02/15/2017 1404   K 3.9 03/12/2017 0232   CL 110 03/12/2017 0232   CO2 22 03/12/2017 0232   GLUCOSE 98 03/12/2017 0232   BUN 13 03/12/2017 0232   BUN 13 02/15/2017 1404   CREATININE 0.79 03/12/2017 0232   CALCIUM 8.6 (L) 03/12/2017 0232   GFRNONAA >60 03/12/2017 0232   GFRAA >60 03/12/2017 0232    Lab Results  Component Value Date   INR 0.96 03/11/2017   INR 0.9 02/15/2017   No results found for: PTT   Amy Kaiser is status post extraction of tooth #25 with dental cleaning of remaining teeth on 03/05/2017. The patient now presents for evaluation of healing and suture removal.  SUBJECTIVE:  Patient with minimal discomfort from extraction site. Patient has one stitch that remains.    EXAM:  There is no sign of infection, heme, or ooze. Sutures are loosely intact.  Extraction site is healing in by primary closure.  Dental caries present as before.   PROCEDURE: The patient was given a chlorhexidine gluconate rinse for 30 seconds. Sutures were then removed without complication. Patient tolerated the procedure well.  ASSESSMENT: Post operative course is consistent with dental procedures performed in the dental clinic. Loss of tooth #25 due to extraction Dental caries Chronic periodontitis  PLAN: 1. Continue salt water rinses as needed to aid healing. 2. Brush teeth after meals and at bedtime. Floss at bedtime. 3. Patient is currently cleared for heart valve surgery as indicated. 4. Patient to follow-up with a dentist of her choice for restoration of dental caries and evaluation  for replacement of missing teeth as indicated once medically stable from the anticipated heart valve surgery. Patient is aware that she will need antibiotic premedication prior to invasive dental procedures per American Heart Association guidelines.     Lenn Cal, DDS

## 2017-03-15 DIAGNOSIS — I350 Nonrheumatic aortic (valve) stenosis: Secondary | ICD-10-CM | POA: Diagnosis not present

## 2017-03-15 DIAGNOSIS — I119 Hypertensive heart disease without heart failure: Secondary | ICD-10-CM | POA: Diagnosis not present

## 2017-03-15 DIAGNOSIS — F419 Anxiety disorder, unspecified: Secondary | ICD-10-CM | POA: Diagnosis not present

## 2017-03-15 DIAGNOSIS — R0602 Shortness of breath: Secondary | ICD-10-CM | POA: Diagnosis not present

## 2017-03-15 DIAGNOSIS — N3000 Acute cystitis without hematuria: Secondary | ICD-10-CM | POA: Diagnosis not present

## 2017-03-15 DIAGNOSIS — Z955 Presence of coronary angioplasty implant and graft: Secondary | ICD-10-CM | POA: Diagnosis not present

## 2017-03-15 DIAGNOSIS — E785 Hyperlipidemia, unspecified: Secondary | ICD-10-CM | POA: Diagnosis not present

## 2017-03-21 DIAGNOSIS — M79645 Pain in left finger(s): Secondary | ICD-10-CM | POA: Diagnosis not present

## 2017-03-22 ENCOUNTER — Other Ambulatory Visit: Payer: Self-pay | Admitting: Family Medicine

## 2017-03-22 ENCOUNTER — Ambulatory Visit
Admission: RE | Admit: 2017-03-22 | Discharge: 2017-03-22 | Disposition: A | Discharge status: Home / Self Care | Payer: Medicare Other | Source: Ambulatory Visit | Attending: Family Medicine | Admitting: Family Medicine

## 2017-03-22 DIAGNOSIS — M19042 Primary osteoarthritis, left hand: Secondary | ICD-10-CM | POA: Diagnosis not present

## 2017-03-22 DIAGNOSIS — M79645 Pain in left finger(s): Secondary | ICD-10-CM

## 2017-04-05 ENCOUNTER — Encounter (HOSPITAL_COMMUNITY)
Admission: RE | Admit: 2017-04-05 | Discharge: 2017-04-05 | Disposition: A | Discharge status: Home / Self Care | Payer: Medicare Other | Source: Ambulatory Visit | Attending: Cardiovascular Disease | Admitting: Cardiovascular Disease

## 2017-04-05 ENCOUNTER — Encounter (HOSPITAL_COMMUNITY): Payer: Self-pay

## 2017-04-05 ENCOUNTER — Ambulatory Visit (HOSPITAL_COMMUNITY)
Admission: RE | Admit: 2017-04-05 | Discharge: 2017-04-05 | Disposition: A | Discharge status: Home / Self Care | Payer: Medicare Other | Source: Ambulatory Visit | Attending: Cardiovascular Disease | Admitting: Cardiovascular Disease

## 2017-04-05 DIAGNOSIS — Z01812 Encounter for preprocedural laboratory examination: Secondary | ICD-10-CM | POA: Insufficient documentation

## 2017-04-05 DIAGNOSIS — I350 Nonrheumatic aortic (valve) stenosis: Secondary | ICD-10-CM | POA: Diagnosis not present

## 2017-04-05 DIAGNOSIS — Z01818 Encounter for other preprocedural examination: Secondary | ICD-10-CM | POA: Diagnosis present

## 2017-04-05 DIAGNOSIS — J9811 Atelectasis: Secondary | ICD-10-CM | POA: Insufficient documentation

## 2017-04-05 HISTORY — DX: Atherosclerotic heart disease of native coronary artery without angina pectoris: I25.10

## 2017-04-05 LAB — COMPREHENSIVE METABOLIC PANEL
ALBUMIN: 3.6 g/dL (ref 3.5–5.0)
ALK PHOS: 66 U/L (ref 38–126)
ALT: 14 U/L (ref 14–54)
AST: 27 U/L (ref 15–41)
Anion gap: 9 (ref 5–15)
BUN: 18 mg/dL (ref 6–20)
CALCIUM: 8.5 mg/dL — AB (ref 8.9–10.3)
CO2: 22 mmol/L (ref 22–32)
CREATININE: 0.91 mg/dL (ref 0.44–1.00)
Chloride: 107 mmol/L (ref 101–111)
GFR calc non Af Amer: 56 mL/min — ABNORMAL LOW (ref >60)
GLUCOSE: 100 mg/dL — AB (ref 65–99)
Potassium: 5.2 mmol/L — ABNORMAL HIGH (ref 3.5–5.1)
SODIUM: 138 mmol/L (ref 135–145)
Total Bilirubin: 1.2 mg/dL (ref 0.3–1.2)
Total Protein: 7.2 g/dL (ref 6.5–8.1)

## 2017-04-05 LAB — BLOOD GAS, ARTERIAL
ACID-BASE EXCESS: 5 mmol/L — AB (ref 0.0–2.0)
Bicarbonate: 28.1 mmol/L — ABNORMAL HIGH (ref 20.0–28.0)
DRAWN BY: 449841
O2 Saturation: 98 %
PCO2 ART: 36.2 mmHg (ref 32.0–48.0)
PH ART: 7.498 — AB (ref 7.350–7.450)
Patient temperature: 98.7
pO2, Arterial: 102 mmHg (ref 83.0–108.0)

## 2017-04-05 LAB — APTT: aPTT: 20 seconds — ABNORMAL LOW (ref 24–36)

## 2017-04-05 LAB — TYPE AND SCREEN
ABO/RH(D): O POS
Antibody Screen: NEGATIVE

## 2017-04-05 LAB — URINALYSIS, ROUTINE W REFLEX MICROSCOPIC
BILIRUBIN URINE: NEGATIVE
Glucose, UA: NEGATIVE mg/dL
Hgb urine dipstick: NEGATIVE
KETONES UR: NEGATIVE mg/dL
Nitrite: NEGATIVE
PROTEIN: NEGATIVE mg/dL
Specific Gravity, Urine: 1.024 (ref 1.005–1.030)
pH: 5 (ref 5.0–8.0)

## 2017-04-05 LAB — PROTIME-INR
INR: 1.01
Prothrombin Time: 13.3 seconds (ref 11.4–15.2)

## 2017-04-05 LAB — SURGICAL PCR SCREEN
MRSA, PCR: NEGATIVE
Staphylococcus aureus: NEGATIVE

## 2017-04-05 LAB — ABO/RH: ABO/RH(D): O POS

## 2017-04-05 NOTE — Progress Notes (Addendum)
PCP - Harlan Stains Cardiologist - Dr. Wynonia Lawman, patient with recent stent placed on ASA and plavix. Pt to continue taking these, but to hold the day of surgery. Patient and daughter verbalized understanding.   Chest x-ray - 04/05/2017  EKG - 03/12/17 ECHO - pt unsure, thinks she had this in last few months, requested this and last office note from Dr. Wynonia Lawman  Cardiac Cath -  03/11/17  Patient denies having Diabetes and states she has low blood sugars if she does not eat and requested [place] to check CBG the day of surgery.   Patient denies shortness of breath, fever, cough and chest pain at PAT appointment   Patient verbalized understanding of instructions that were given to them at the PAT appointment. Patient was also instructed that they will need to review over the PAT instructions again at home before surgery.

## 2017-04-05 NOTE — Pre-Procedure Instructions (Signed)
Amy Kaiser  04/05/2017      [id number] - [address], [address] [place] [address] Phone: [phone] Fax: [phone]  [facility] [address] [place], [place] - [place] [address] Phone: [phone] Fax: [phone]    Your procedure is scheduled on Tuesday June 5.  Report to [facility] Admitting at 5:30 A.M.  Call this number if you have problems the morning of surgery:  [phone]   Remember:  Do not eat food or drink liquids after midnight.  Take these medicines the morning of surgery with A SIP OF WATER: albuterol Inhaler if needed (please bring inhaler to hospital with you)  FOLLOW MD's instructions about stopping Plavix and Aspirin  7 days prior to surgery STOP taking any  Aleve, Naproxen, Ibuprofen, Motrin, Advil, Goody's, BC's, all herbal medications, fish oil, and all vitamins    Do not wear jewelry, make-up or nail polish.  Do not wear lotions, powders, or perfumes, or deoderant.  Do not shave 48 hours prior to surgery.  Men may shave face and neck.  Do not bring valuables to the hospital.  [facility] is not responsible for any belongings or valuables.  Contacts, dentures or bridgework may not be worn into surgery.  Leave your suitcase in the car.  After surgery it may be brought to your room.  For patients admitted to the hospital, discharge time will be determined by your treatment team.  Patients discharged the day of surgery will not be allowed to drive home.    Special instructions:    [facility]- Preparing For Surgery  Before surgery, you can play an important role. Because skin is not sterile, your skin needs to be as free of germs as possible. You can reduce the number of germs on your skin by washing with CHG (chlorahexidine gluconate) Soap before surgery.  CHG is an antiseptic cleaner which kills germs and bonds with the skin to  continue killing germs even after washing.  Please do not use if you have an allergy to CHG or antibacterial soaps. If your skin becomes reddened/irritated stop using the CHG.  Do not shave (including legs and underarms) for at least 48 hours prior to first CHG shower. It is OK to shave your face.  Please follow these instructions carefully.   1. Shower the NIGHT BEFORE SURGERY and the MORNING OF SURGERY with CHG.   2. If you chose to wash your hair, wash your hair first as usual with your normal shampoo.  3. After you shampoo, rinse your hair and body thoroughly to remove the shampoo.  4. Use CHG as you would any other liquid soap. You can apply CHG directly to the skin and wash gently with a scrungie or a clean washcloth.   5. Apply the CHG Soap to your body ONLY FROM THE NECK DOWN.  Do not use on open wounds or open sores. Avoid contact with your eyes, ears, mouth and genitals (private parts). Wash genitals (private parts) with your normal soap.  6. Wash thoroughly, paying special attention to the area where your surgery will be performed.  7. Thoroughly rinse your body with warm water from the neck down.  8. DO NOT shower/wash with your normal soap after using and rinsing off the CHG Soap.  9. Pat yourself dry with a CLEAN TOWEL.   10. Wear CLEAN PAJAMAS   11. Place CLEAN SHEETS on your  bed the night of your first shower and DO NOT SLEEP WITH PETS.    Day of Surgery: Do not apply any deodorants/lotions. Please wear clean clothes to the hospital/surgery center.      Please read over the following fact sheets that you were given. MRSA Information

## 2017-04-05 NOTE — Progress Notes (Signed)
   How to Manage Your Diabetes Before and After Surgery  Why is it important to control my blood sugar before and after surgery? . Improving blood sugar levels before and after surgery helps healing and can limit problems. . A way of improving blood sugar control is eating a healthy diet by: o  Eating less sugar and carbohydrates o  Increasing activity/exercise o  Talking with your doctor about reaching your blood sugar goals . High blood sugars (greater than 180 mg/dL) can raise your risk of infections and slow your recovery, so you will need to focus on controlling your diabetes during the weeks before surgery. . Make sure that the doctor who takes care of your diabetes knows about your planned surgery including the date and location.  How do I manage my blood sugar before surgery? . Check your blood sugar at least 4 times a day, starting 2 days before surgery, to make sure that the level is not too high or low. o Check your blood sugar the morning of your surgery when you wake up and every 2 hours until you get to the Short Stay unit. . If your blood sugar is less than 70 mg/dL, you will need to treat for low blood sugar: o Do not take insulin. o Treat a low blood sugar (less than 70 mg/dL) with  cup of clear juice (cranberry or apple), 4 glucose tablets, OR glucose gel. o Recheck blood sugar in 15 minutes after treatment (to make sure it is greater than 70 mg/dL). If your blood sugar is not greater than 70 mg/dL on recheck, call [phone] for further instructions. . Report your blood sugar to the short stay nurse when you get to Short Stay.  . If you are admitted to the hospital after surgery: o Your blood sugar will be checked by the staff and you will probably be given insulin after surgery (instead of oral diabetes medicines) to make sure you have good blood sugar levels. o The goal for blood sugar control after surgery is 80-180 mg/dL.           

## 2017-04-05 NOTE — Progress Notes (Signed)
Pt CBC hemolyzed per lab, will need to re-draw DOS

## 2017-04-06 LAB — HEMOGLOBIN A1C
HEMOGLOBIN A1C: 5.7 % — AB (ref 4.8–5.6)
MEAN PLASMA GLUCOSE: 117 mg/dL

## 2017-04-08 ENCOUNTER — Encounter: Payer: Self-pay | Admitting: Thoracic Surgery (Cardiothoracic Vascular Surgery)

## 2017-04-08 ENCOUNTER — Institutional Professional Consult (permissible substitution) (INDEPENDENT_AMBULATORY_CARE_PROVIDER_SITE_OTHER): Payer: Medicare Other | Admitting: Thoracic Surgery (Cardiothoracic Vascular Surgery)

## 2017-04-08 VITALS — BP 181/69 | HR 76 | Resp 16 | Ht 66.0 in | Wt 164.0 lb

## 2017-04-08 DIAGNOSIS — I350 Nonrheumatic aortic (valve) stenosis: Secondary | ICD-10-CM | POA: Diagnosis not present

## 2017-04-08 LAB — POCT I-STAT 3, ART BLOOD GAS (G3+)
Acid-Base Excess: 5 mmol/L — ABNORMAL HIGH (ref 0.0–2.0)
Bicarbonate: 28.1 mmol/L — ABNORMAL HIGH (ref 20.0–28.0)
O2 Saturation: 98 %
TCO2: 29 mmol/L (ref 0–100)
pCO2 arterial: 36.3 mmHg (ref 32.0–48.0)
pH, Arterial: 7.498 — ABNORMAL HIGH (ref 7.350–7.450)
pO2, Arterial: 102 mmHg (ref 83.0–108.0)

## 2017-04-08 MED ORDER — SODIUM CHLORIDE 0.9 % IV SOLN
30.0000 ug/min | INTRAVENOUS | Status: DC
  Filled 2017-04-08: qty 2

## 2017-04-08 MED ORDER — MAGNESIUM SULFATE 50 % IJ SOLN
40.0000 meq | INTRAMUSCULAR | Status: DC
  Filled 2017-04-08: qty 10

## 2017-04-08 MED ORDER — SODIUM CHLORIDE 0.9 % IV SOLN: INTRAVENOUS | Status: DC

## 2017-04-08 MED ORDER — DOPAMINE-DEXTROSE 3.2-5 MG/ML-% IV SOLN
0.0000 ug/kg/min | INTRAVENOUS | Status: DC
  Filled 2017-04-08: qty 250

## 2017-04-08 MED ORDER — DEXMEDETOMIDINE HCL IN NACL 400 MCG/100ML IV SOLN
0.1000 ug/kg/h | INTRAVENOUS | Status: AC
  Administered 2017-04-09: .3 ug/kg/h via INTRAVENOUS
  Filled 2017-04-08: qty 100

## 2017-04-08 MED ORDER — INSULIN REGULAR HUMAN 100 UNIT/ML IJ SOLN
INTRAMUSCULAR | Status: DC
  Filled 2017-04-08: qty 1

## 2017-04-08 MED ORDER — LEVOFLOXACIN IN D5W 500 MG/100ML IV SOLN
500.0000 mg | INTRAVENOUS | Status: AC
  Administered 2017-04-09: 500 mg via INTRAVENOUS
  Filled 2017-04-08: qty 100

## 2017-04-08 MED ORDER — CHLORHEXIDINE GLUCONATE 0.12 % MT SOLN
15.0000 mL | Freq: Once | OROMUCOSAL | Status: AC
  Administered 2017-04-09: 15 mL via OROMUCOSAL
  Filled 2017-04-08: qty 15

## 2017-04-08 MED ORDER — EPINEPHRINE PF 1 MG/ML IJ SOLN
0.0000 ug/min | INTRAMUSCULAR | Status: DC
  Filled 2017-04-08: qty 4

## 2017-04-08 MED ORDER — NOREPINEPHRINE BITARTRATE 1 MG/ML IV SOLN
0.0000 ug/min | INTRAVENOUS | Status: DC
  Filled 2017-04-08: qty 4

## 2017-04-08 MED ORDER — HEPARIN SODIUM (PORCINE) 1000 UNIT/ML IJ SOLN
INTRAMUSCULAR | Status: DC
  Filled 2017-04-08: qty 30

## 2017-04-08 MED ORDER — NITROGLYCERIN IN D5W 200-5 MCG/ML-% IV SOLN
2.0000 ug/min | INTRAVENOUS | Status: DC
  Filled 2017-04-08: qty 250

## 2017-04-08 MED ORDER — POTASSIUM CHLORIDE 2 MEQ/ML IV SOLN
80.0000 meq | INTRAVENOUS | Status: DC
  Filled 2017-04-08: qty 40

## 2017-04-08 MED ORDER — VANCOMYCIN HCL 10 G IV SOLR
1250.0000 mg | INTRAVENOUS | Status: AC
  Administered 2017-04-09: 1250 mg via INTRAVENOUS
  Filled 2017-04-08: qty 1250

## 2017-04-08 NOTE — H&P (Signed)
[address]       [place] [address]             [phone]      Cardiothoracic Surgery Admission History and Physical   Referring Provider is Jacolyn Reedy, MD PCP is Vidal Schwalbe, MD      Chief Complaint  Patient presents with  . Aortic Stenosis        HPI:  The patient is an 83 year old woman with diabetes, hyperlipidemia, asthma and COPD who was evaluated about a year ago by Dr. Wynonia Lawman for a heart murmur. Echo at that time showed moderate AS. She reports a history of asthma for at least 25 years and it has been mild requiring occasional inhaler use. Over the past 6 months she has developed progressive exertional shortness of breath and chest tightness resolved with rest. It usually occurs with walking up hills. She has not had dizziness or syncope. She has been using her inhaler frequently. She had an echo repeated recently that showed progression of her AS with a mean gradient of 43 mm Hg. LVEF was 65%.   She is here with her son and daughter today. She is widowed and lives at home by herself and is independent.       Past Medical History:  Diagnosis Date  . Anemia   . Anxiety   . Arthritis   . Asthma   . Cancer ([place])   . Cervical cancer ([place])   . COPD (chronic obstructive pulmonary disease) ([place])   . Depression   . Diabetes mellitus without complication ([place])   . Hyperlipidemia          Past Surgical History:  Procedure Laterality Date  . ABDOMINAL HYSTERECTOMY    . APPENDECTOMY    . CATARACT EXTRACTION    . IR RADIOLOGY PERIPHERAL GUIDED IV START  02/26/2017  . IR US GUIDE VASC ACCESS RIGHT  02/26/2017  . RIGHT/LEFT HEART CATH AND CORONARY ANGIOGRAPHY N/A 02/20/2017   Procedure: Right/Left Heart Cath and Coronary Angiography;  Surgeon: Sherren Mocha, MD;  Location: [facility];  Service: Cardiovascular;  Laterality: N/A;    Family History: Father died of MI and brother had heart  disease.  Social History        Social History  . Marital status: Widowed    Spouse name: N/A  . Number of children: 3  . Years of education: N/A      Occupational History  . Not on file.       Social History Main Topics  . Smoking status: Never Smoker  . Smokeless tobacco: Never Used  . Alcohol use No  . Drug use: No  . Sexual activity: Not on file       Other Topics Concern  . Not on file      Social History Narrative  . No narrative on file          Current Outpatient Prescriptions  Medication Sig Dispense Refill  . aspirin 81 MG chewable tablet Chew by mouth daily.    . calcium-vitamin D (OSCAL WITH D) 500-200 MG-UNIT tablet Take 1 tablet by mouth 2 (two) times daily.    . Cholecalciferol (VITAMIN D3) 1000 units CAPS Take 1,000 Units by mouth daily.    Marland Kitchen FLOVENT HFA 220 MCG/ACT inhaler Take 1 puff by mouth at bedtime as needed (wheezing). sleep  12  . LORazepam (ATIVAN) 0.5 MG tablet Take 0.5 mg by  mouth at bedtime as needed for sleep.   2  . Multiple Vitamins-Minerals (ICAPS AREDS 2 PO) Take 1 capsule by mouth 2 (two) times daily.    . rosuvastatin (CRESTOR) 10 MG tablet Take 10 mg by mouth every evening.    . VENTOLIN HFA 108 (90 BASE) MCG/ACT inhaler Inhale 2 puffs into the lungs 2 (two) times daily as needed for wheezing or shortness of breath.   2  . clindamycin (CLEOCIN) 150 MG capsule Take four (4) capsules one hour prior to dental appointment. (Patient not taking: Reported on 02/27/2017) 4 capsule 2   No current facility-administered medications for this visit.          Allergies  Allergen Reactions  . Alendronate Sodium     Doesn't recall   . Codeine Nausea And Vomiting  . Latex Hives and Itching    Caused blisters in her mouth  . Lisinopril     Weakness   . Morphine And Related Other (See Comments)    unknown  . Penicillins Other (See Comments)    Intolerance to ALL "CILLINS" Has patient had a PCN  reaction causing immediate rash, facial/tongue/throat swelling, SOB or lightheadedness with hypotension: unknown Has patient had a PCN reaction causing severe rash involving mucus membranes or skin necrosis: unknown Has patient had a PCN reaction that required hospitalization unknown Has patient had a PCN reaction occurring within the last 10 years: unknown If all of the above answers are "NO", then may proceed with Cephalosporin use.   Christy Sartorius Sodium] Other (See Comments)    Leg pain   . Prozac [Fluoxetine Hcl]     Feels shaky   . Symbicort [Budesonide-Formoterol Fumarate]     shakey   . Wellbutrin [Bupropion]     Jittery       Review of Systems:              General:                      normal appetite, decreased energy, no weight gain, no weight loss, no fever             Cardiac:                       has chest pain with exertion, no chest pain at rest, moderate SOB with mild exertion, no resting SOB, no PND, no orthopnea, no palpitations, no arrhythmia, no atrial fibrillation, no LE edema, occasional dizzy spells, no syncope             Respiratory:                 exertional shortness of breath, no home oxygen, has productive cough, no dry cough, no bronchitis, has wheezing, no hemoptysis, has asthma, no pain with inspiration or cough, no sleep apnea, no CPAP at night             GI:                               no difficulty swallowing, no reflux, no frequent heartburn, no hiatal hernia, no abdominal pain, no constipation, no diarrhea, no hematochezia, no hematemesis, no melena             GU:  no dysuria,  no frequency, no urinary tract infection, no hematuria, no kidney stones, no kidney disease             Vascular:                     no pain suggestive of claudication, has pain in feet, no leg cramps, has varicose veins, no DVT, no non-healing foot ulcer             Neuro:                         no stroke,  no TIA's, no seizures, no headaches, no temporary blindness one eye,  no slurred speech, no peripheral neuropathy, no chronic pain, no instability of gait, no memory/cognitive dysfunction             Musculoskeletal:         has arthritis, no joint swelling, has myalgias, no difficulty walking, normal mobility              Skin:                            no rash, no itching, no skin infections, no pressure sores or ulcerations             Psych:                         has anxiety, no depression, no nervousness, has unusual recent stress             Eyes:                           no blurry vision, no floaters, no recent vision changes,  wears glasse             ENT:                            no hearing loss, has loose or painful teeth, has partial dentures, last saw dentist 02/25/2017 and extraction of one tooth planned by Dr. Enrique Sack             Hematologic:               has easy bruising, has abnormal bleeding, no clotting disorder, no frequent epistaxis             Endocrine:                   has diabetes, does not check CBG's at home                             Physical Exam:              BP (!) 189/91 (BP Location: Right Arm, Patient Position: Sitting, Cuff Size: Large)   Pulse 81   Resp 16   Ht 5\' 6"  (1.676 m)   Wt 164 lb (74.4 kg)   SpO2 94% Comment: ON RA  BMI 26.47 kg/m              General:                      Elderly but  well-appearing  HEENT:                       Unremarkable, NCAT, PERLA, EOMI, oropharynx clear             Neck:                           no JVD, no bruits, no adenopathy or thyromegaly             Chest:                          clear to auscultation, symmetrical breath sounds, no wheezes, no rhonchi              CV:                              RRR, grade III/VI crescendo/decrescendo murmur heard best at RSB,  no diastolic murmur             Abdomen:                    soft, non-tender, no masses or organomegaly             Extremities:                  warm, well-perfused, pulses palpable in feet, no LE edema             Rectal/GU                   Deferred             Neuro:                         Grossly non-focal and symmetrical throughout             Skin:                            Clean and dry, no rashes, no breakdown   Diagnostic Tests:   2D echo from Dr. Thurman Coyer office reviewed on disc. It shows severe AS with a mean gradient of 43 mm Hg. The aortic valve is trileaflet with calcified leaflets and restricted mobility.  SAFAA STINGLEY  Cardiac catheterization  Order# [id number]  Reading physician: Sherren Mocha, MD Ordering physician: Sherren Mocha, MD Study date: 02/20/17  Physicians   Panel Physicians Referring Physician Case Authorizing Physician  Sherren Mocha, MD (Primary)    Procedures   Right/Left Heart Cath and Coronary Angiography  Conclusion   1. Mild-Moderate ostial RCA stenosis, component of catheter-induced vasospasm 2. Patent left main, LAD, LCx with minor nonobstructive CAD 3. Severe calcific aortic stenosis  Recommend: Continued TAVR evaluation. I do not think the ostial RCA is hemodynamically significant and likely a major component of catheter-induced vasospasm is present. Can further assess with gated cardiac CTA which will be done as part of her TAVR workup.  Indications   Severe aortic stenosis [I35.0 (ICD-10-CM)]  Procedural Details/Technique   Technical Details INDICATION: Severe aortic stenosis - Pre TAVR assessment  PROCEDURAL DETAILS: There was an indwelling IV in a right antecubital vein. Using normal sterile technique, the IV was changed out for a 5 Fr brachial sheath over a 0.018 inch wire. The right wrist was then  prepped, draped, and anesthetized with 1% lidocaine. Using the modified Seldinger technique a 5/6 French Slender sheath was placed in the right radial artery. Intra-arterial verapamil was administered through the radial artery sheath. IV heparin was  administered after a JR4 catheter was advanced into the central aorta. A Swan-Ganz catheter was used for the right heart catheterization. Standard protocol was followed for recording of right heart pressures and sampling of oxygen saturations. Fick cardiac output was calculated. Standard Judkins catheters were used for selective coronary angiography. The aortic valve is crossed with a straight wire directed by a JR4 catheter. Pullback gradients are measured. There is pressure dampening at the RCA ostium as the study progresses. IC NTG is administered and further imaging is done. A 4 Fr JR4 catheter is used to repeat imaging with NTG administered again. There were no immediate procedural complications. The patient was transferred to the post catheterization recovery area for further monitoring.     Estimated blood loss <50 mL.  During this procedure the patient was administered the following to achieve and maintain moderate conscious sedation: Versed 3 mg, Fentanyl 50 mcg, while the patient's heart rate, blood pressure, and oxygen saturation were continuously monitored. The period of conscious sedation was 53 minutes, of which I was present face-to-face 100% of this time.    Coronary Findings   Dominance: Right  Left Anterior Descending  Prox LAD lesion, 25% stenosed.  Left Circumflex  Prox Cx to Mid Cx lesion, 25% stenosed.  Right Coronary Artery  Ost RCA lesion, 50% stenosed. Probably 40-50% RCA stenosis with catheter-induced vasospasm. Appearance worsens with subsequent injections to 80% stenosis but highly suspicious for vasospasm. Some improvement with IC NTG and repeat imaging with a 4 Fr catheter.  Right Heart   Right Heart Pressures Elevated LV EDP consistent with volume overload.    Left Heart   Aortic Valve Mean gradient 28 mmHg, AVA 1.0 square cm    Coronary Diagrams   Diagnostic Diagram       Implants        No implant documentation for this case.  PACS Images     Show images for Cardiac catheterization   Link to Procedure Log   Procedure Log    Hemo Data    Most Recent Value  Fick Cardiac Output 6.33 L/min  Fick Cardiac Output Index 3.3 (L/min)/BSA  Aortic Mean Gradient 28.3 mmHg  Aortic Peak Gradient 29 mmHg  Aortic Valve Area 1.00  Aortic Value Area Index 0.52 cm2/BSA  RA A Wave 11 mmHg  RA V Wave 9 mmHg  RA Mean 9 mmHg  RV Systolic Pressure 44 mmHg  RV Diastolic Pressure 7 mmHg  RV EDP 12 mmHg  PA Systolic Pressure 41 mmHg  PA Diastolic Pressure 11 mmHg  PA Mean 28 mmHg  AO Systolic Pressure 960 mmHg  AO Diastolic Pressure 78 mmHg  AO Mean 454 mmHg  LV Systolic Pressure 098 mmHg  LV Diastolic Pressure 13 mmHg  LV EDP 18 mmHg  Arterial Occlusion Pressure Extended Systolic Pressure 119 mmHg  Arterial Occlusion Pressure Extended Diastolic Pressure 80 mmHg  Arterial Occlusion Pressure Extended Mean Pressure 120 mmHg  Left Ventricular Apex Extended Systolic Pressure 147 mmHg  Left Ventricular Apex Extended Diastolic Pressure 15 mmHg  Left Ventricular Apex Extended EDP Pressure 20 mmHg  QP/QS 1  TPVR Index 8.49 HRUI  TSVR Index 36.06 HRUI  TPVR/TSVR Ratio 0.24    CT CORONARY MORPH W/CTA COR W/SCORE W/CA W/CM &/OR WO/CM (Accession [id number]) (Order [id number])  Imaging  Date: 02/26/2017 Department: [facility] CT IMAGING Released By: Reggy Eye Authorizing: Sherren Mocha, MD  Exam Information   Status Exam Begun  Exam Ended   Final [99] 02/26/2017 11:02 AM 02/26/2017 11:39 AM  PACS Images   Show images for CT CORONARY MORPH W/CTA COR W/SCORE W/CA W/CM &/OR WO/CM  Addendum   ADDENDUM REPORT: 02/28/2017 16:26  CLINICAL DATA: 83 year old female with severe aortic stenosis and suspicion for severe RCA stenosis on cardiac catheterization. Evaluate severity of RCA stenosis and evaluate for TAVR.  EXAM: Cardiac TAVR CT  TECHNIQUE: The patient was scanned on a Philips 256 scanner. A  120 kV retrospective scan was triggered in the descending thoracic aorta at 111 HU's. Gantry rotation speed was 270 msecs and collimation was .9 mm. 10 mg of iv metoprolol or 0.4 mg of sl NTG were given. The 3D data set was reconstructed in 5% intervals of the R-R cycle. Systolic and diastolic phases were analyzed on a dedicated work station using MPR, MIP and VRT modes. The patient received 80 cc of contrast.  FINDINGS: Aortic Valve: Trileaflet, moderately thickened, mildly calcified with no calcium extending into LVOT.  Aorta: Mild diffuse calcifications. No dissection.  Sinotubular Junction: 23 x 22 mm  Ascending Thoracic Aorta: 29 x 28 mm  Aortic Arch: 25 x 24 mm  Descending Thoracic Aorta: 20 x 19 mm  Sinus of Valsalva Measurements:  Non-coronary: 24 mm  Right -coronary: 22 mm  Left -coronary: 25 mm  Coronary Artery Height above Annulus:  Left Main: 12 mm  Right Coronary: 13 mm  Virtual Basal Annulus Measurements:  Maximum/Minimum Diameter: 23 x 17 mm  Perimeter: 70 mm  Area: 297 mm2  Coronary Arteries: Normal origin, right dominance.  RCA is a very large dominant artery giving rise to PDA and PLVB. There is a severe mixed, predominantly noncalcified plaque in the ostial RCA suspicious for > 70 stenosis. Minimal plaque in the remaining RCA.  LM is a large long vessel with no plaque.  LAD is a large vessel that gives rise to one diagonal branch. There is a long calcific lesion in the proximal LAD extending into diagonal branch associated with 25-50% stenosis.  LCX is a small non-dominant vessel that has only minimal noncalcified plaque.  Optimum Fluoroscopic Angle for Delivery: LAO 8, CAU 9  IMPRESSION: 1. Trileaflet, moderately thickened, mildly calcified aortic valve with no calcium extending into LVOT. Annular measurements suitable for delivery of a 23 mm Edwards-SAPIEN 3 valve.  2. Sufficient annulus  to coronary distance.  3. Optimum Fluoroscopic Angle for Delivery: LAO 8, CAU 9  4. There is a severe mixed, predominantly noncalcified plaque in the ostial RCA suspicious for > 70 stenosis. Please see attachment for full CT FFR analysis. Ostial RCA FFR 0.65. Revascularization is recommended.  Ena Dawley   Electronically Signed By: Ena Dawley On: 02/28/2017 16:26      CT CORONARY FRACTIONAL FLOW RESERVE DATA PREP (Accession [id number]) (Order [id number])  Imaging  Date: 02/26/2017 Department: [facility] CT IMAGING Released By: Reggy Eye Authorizing: Sherren Mocha, MD  Exam Information   Status Exam Begun  Exam Ended   Final [99] 02/27/2017 3:31 PM 02/27/2017 3:31 PM  Addendum   ADDENDUM REPORT: 02/28/2017 15:42  EXAM: FF/RCT ANALYSIS  FINDINGS: FFRct analysis was performed on the original cardiac CT angiogram dataset. Diagrammatic representation of the FFRct analysis is provided in a separate PDF document in PACS. This dictation was created using the  PDF document and an interactive 3D model of the results. 3D model is not available in the EMR/PACS. Normal FFR range is >0.80.  1. Left Main: No significant stenosis.  2. LAD: No significant stenosis in the proximal and mid segment, FFR 0.75 in the very distal portion of LAD. 3. LCX: No significant stenosis. 4. RCA: There is significant stenosis in the ostial RCA with FFR 0.65.  IMPRESSION: 1. CT FFR analysis show significant stenosis in the ostial RCA (FFR 0.65) correlating well with the anatomical findings.   Electronically Signed By: Ena Dawley On: 02/28/2017 15:42      CT Angio Abd/Pel w/ and/or w/o (Accession [id number]) (Order [id number])  Imaging  Date: 02/26/2017 Department: [facility] CT IMAGING Released By: Reggy Eye Authorizing: Sherren Mocha, MD  Exam Information   Status Exam Begun  Exam Ended    Final [99] 02/26/2017 11:06 AM 02/26/2017 11:42 AM  PACS Images   Show images for CT Angio Abd/Pel w/ and/or w/o  Study Result   CLINICAL DATA: 83 year old female with history of severe aortic stenosis. Preprocedural study prior to potential transcatheter aortic valve replacement (TAVR) procedure.  EXAM: CT ANGIOGRAPHY CHEST, ABDOMEN AND PELVIS  TECHNIQUE: Multidetector CT imaging through the chest, abdomen and pelvis was performed using the standard protocol during bolus administration of intravenous contrast. Multiplanar reconstructed images and MIPs were obtained and reviewed to evaluate the vascular anatomy.  CONTRAST: 80 mL of Isovue 370.  COMPARISON: Chest CT 07/11/2010. CT the abdomen and pelvis 10/24/2009.  FINDINGS: CTA CHEST FINDINGS  Cardiovascular: Heart size is normal. There is no significant pericardial fluid, thickening or pericardial calcification. There is aortic atherosclerosis, as well as atherosclerosis of the great vessels of the mediastinum and the coronary arteries, including calcified atherosclerotic plaque in the left anterior descending coronary artery. Thickening calcification of the aortic valve, compatible with the reported clinical history of severe aortic stenosis.  Mediastinum/Lymph Nodes: No pathologically enlarged mediastinal or hilar lymph nodes. Small hiatal hernia. No axillary lymphadenopathy.  Lungs/Pleura: No acute consolidative airspace disease. No pleural effusions. No suspicious appearing pulmonary nodules or masses. Mild linear scarring in the lung bases bilaterally.  Musculoskeletal/Soft Tissues: There are no aggressive appearing lytic or blastic lesions noted in the visualized portions of the skeleton.  CTA ABDOMEN AND PELVIS FINDINGS  Hepatobiliary: No cystic or solid hepatic lesions. No intra or extrahepatic biliary ductal dilatation. Gallbladder is normal in appearance.  Pancreas: No pancreatic  mass. No pancreatic ductal dilatation. No pancreatic or peripancreatic fluid or inflammatory changes.  Spleen: Unremarkable.  Adrenals/Urinary Tract: Multiple tiny 2-3 mm low-attenuation lesions in both kidneys are too small to characterize, but are statistically likely to represent tiny cysts. Bilateral adrenal glands are normal in appearance. No hydroureteronephrosis. Urinary bladder is normal in appearance.  Stomach/Bowel: The appearance of the stomach is normal. No pathologic dilatation of small bowel or colon. Numerous colonic diverticulae are noted, particularly in the sigmoid colon, without surrounding inflammatory changes to suggest an acute diverticulitis at this time. Normal appendix.  Vascular/Lymphatic: Aortic atherosclerosis, without evidence of aneurysm or dissection in the abdominal or pelvic vasculature. Vascular measurements pertinent to potential TAVR procedure, as detailed below. The celiac axis, superior mesenteric artery and inferior mesenteric artery are all widely patent without hemodynamically significant stenosis. Single renal arteries bilaterally are widely patent. No lymphadenopathy noted in the abdomen or pelvis.  Reproductive: Status post hysterectomy. Ovaries are not confidently identified may be surgically absent or atrophic.  Other: No significant volume of ascites.  No pneumoperitoneum.  Musculoskeletal: There are no aggressive appearing lytic or blastic lesions noted in the visualized portions of the skeleton.  VASCULAR MEASUREMENTS PERTINENT TO TAVR:  AORTA:  Minimal Aortic Diameter - 11 x 11 mm  Severity of Aortic Calcification - moderate  RIGHT PELVIS:  Right Common Iliac Artery -  Minimal Diameter - 7.9 x 8.7 mm  Tortuosity - mild  Calcification - mild  Right External Iliac Artery -  Minimal Diameter - 5.9 x 6.1 mm  Tortuosity - mild  Calcification - none  Right Common Femoral Artery -  Minimal  Diameter - 6.3 x 6.8 mm  Tortuosity - mild  Calcification - none  LEFT PELVIS:  Left Common Iliac Artery -  Minimal Diameter - 8.2 x 7.7 mm  Tortuosity - mild  Calcification - none  Left External Iliac Artery -  Minimal Diameter - 6.2 x 6.2 mm  Tortuosity - mild  Calcification - none  Left Common Femoral Artery -  Minimal Diameter - 6.3 x 7.0 mm  Tortuosity - mild  Calcification - none  Review of the MIP images confirms the above findings.  IMPRESSION: 1. Vascular findings and measurements pertinent to potential TAVR procedure, as detailed above. This patient has suitable pelvic arterial access bilaterally. 2. Thickening calcification of the aortic valve, compatible with the reported clinical history of severe aortic stenosis. 3. Aortic atherosclerosis, in addition to left anterior descending coronary artery disease. 4. Small hiatal hernia. 5. Colonic diverticulosis without evidence of acute diverticulitis at this time. 6. Additional incidental findings, as above.   Electronically Signed By: Vinnie Langton M.D. On: 02/26/2017 14:44    RISK SCORES\pardAbout the STS Risk Calculator\pardProcedure: AV Replacement + CAB\cb3 Risk of Mortality: 6.151%\cb3 Morbidity or Mortality: 26.418%\cb3 Long Length of Stay: 18.802%\cb3 Short Length of Stay: 12.462%\cb3 Permanent Stroke: 2.042%\cb3 Prolonged Ventilation: 21.268%\cb3 DSW Infection: 0.437%\cb3 Renal Failure: 6.955%\cb3 Reoperation: 9.367%  Impression:  This 83 year old woman has stage D, severe, symptomatic aortic stenosis with progressive exertional shortness of breath and fatigue consistent with NYHA class III chronic diastolic heart failure. She also had some chest discomfort and dizziness at times. Her cath showed some stenosis of the RCA ostium that worsened with further engagement of the ostium and it was unclear if it worsened from spasm or that the degree of stenosis was worse  than it appeared initially. Her gated cardiac CT with FFR of the RCA indicated that it may be more significant than initially felt with an FFR of 0.65. There are no other significant coronary stenoses. She underwent PCI with a DES to the RCA on 03/11/2017. I have personally reviewed her echo, cath, and CT studies. She has severe AS with a trileaflet valve with mild calcified, thickened leaflets with restricted mobility.  I think AVR is indicated in this patient for relief of symptoms and improvement in quality of life as well as to prevent progressive LV dysfunction. She is at least intermediate risk for open surgical AVR and CABG with an STS PROM of 6.2%. I think TAVR would be a reasonable alternative for her. The heart valve team discussed that and agreed.  Her cardiac CT shows favorable anatomy for a Sapien 3 valve and her abdominal and pelvic CT shows anatomy suitable for transfemoral approach.   The patient and her family were counseled at length regarding treatment alternatives for management of severe symptomatic aortic stenosis. The risks and benefits of surgical intervention has been discussed in detail. Long-term prognosis with medical therapy was discussed. Alternative  approaches such as conventional surgical aortic valve replacement, transcatheter aortic valve replacement, and palliative medical therapy were compared and contrasted at length. This discussion was placed in the context of the patient's own specific clinical presentation and past medical history. All of their questions been addressed. The patient is eager to proceed with surgical management as soon as possible.  The patient has been advised of a variety of complications that might develop including but not limited to risks of death, stroke, paravalvular leak, aortic dissection or other major vascular complications, aortic annulus rupture, device embolization, cardiac rupture or perforation, mitral regurgitation, acute myocardial  infarction, arrhythmia, heart block or bradycardia requiring permanent pacemaker placement, congestive heart failure, respiratory failure, renal failure, pneumonia, infection, other late complications related to structural valve deterioration or migration, or other complications that might ultimately cause a temporary or permanent loss of functional independence or other long term morbidity. The patient provides full informed consent for the TAVR procedure as described if we decide to proceed in that direction and all questions were answered.     Plan:  Transfemoral TAVR using a Sapien 3 valve.    Gaye Pollack, MD

## 2017-04-08 NOTE — Progress Notes (Addendum)
Anesthesia Chart Review:  Pt is an 83 year old female scheduled for TAVR on 04/09/2017 with Sherren Mocha, MD  - PCP is Harlan Stains, MD - Cardiologist is Tollie Eth, MD  PMH includes:  CAD, aortic stenosis, HTN, DM, hyperlipidemia, anemia, asthma, COPD, cervical cancer. Never smoker. BMI 26.5.  Medications include: Albuterol, ASA 81 mg, Plavix, Flovent, metoprolol, rosuvastatin. Pt to hold Plavix and aspirin DOS.  Preoperative labs reviewed.  - HbA1c 5.7, glucose 100. - CBC hemolyzed, will be obtained DOS  CXR 04/05/17: Low lung volumes with mild basilar atelectasis and/or scarring  EKG 03/12/17: NSR. Possible Anterior infarct, age undetermined. Borderline Left axis deviation.   CT coronary fractional flow reserve fluid analysis 02/28/17:  1. Left Main:  No significant stenosis. 2. LAD: No significant stenosis in the proximal and mid segment, FFR 0.75 in the very distal portion of LAD. 3. LCX: No significant stenosis. 4. RCA: There is significant stenosis in the ostial RCA with FFR 0.65. - IMPRESSION: 1. CT FFR analysis show significant stenosis in the ostial RCA (FFR 0.65) correlating well with the anatomical findings.  Carotid duplex 02/27/17: Findings suggest 1-39% ICA stenosis bilaterally. Vertebral arteries are patent with antegrade flow.  CTA coronoary morphology 02/26/17:  1. Trileaflet, moderately thickened, mildly calcified aortic valve with no calcium extending into LVOT. Annular measurements suitable for delivery of a 23 mm Edwards-SAPIEN 3 valve. 2. Sufficient annulus to coronary distance. 3. Optimum Fluoroscopic Angle for Delivery: LAO 8, CAU 9 4. There is a severe mixed, predominantly noncalcified plaque in the ostial RCA suspicious for > 70 stenosis. Please see attachment for full CT FFR analysis. Ostial RCA FFR 0.65. Revascularization is recommended  CTA chest, abdomen, pelvis 02/26/17:  1. Vascular findings and measurements pertinent to potential TAVR procedure, as  detailed above. This patient has suitable pelvic arterial access bilaterally. 2. Thickening calcification of the aortic valve, compatible with the reported clinical history of severe aortic stenosis. 3. Aortic atherosclerosis, in addition to left anterior descending coronary artery disease. 4. Small hiatal hernia. 5. Colonic diverticulosis without evidence of acute diverticulitis at this time. 6. Additional incidental findings, as above.  Cardiac cath 02/20/17:  1. Mild-Moderate ostial RCA stenosis, component of catheter-induced vasospasm. S/p DES to RCA 03/11/17.  2. Patent left main, LAD, LCx with minor nonobstructive CAD 3. Severe calcific aortic stenosis - Recommend: Continued TAVR evaluation. I do not think the ostial RCA is hemodynamically significant and likely a major component of catheter-induced vasospasm is present. Can further assess with gated cardiac CTA which will be done as part of her TAVR workup.  Echo 01/28/17 (Dr. Thurman Coyer office): 1. Mild concentric LVH with normal to hyperdynamic global wall motion. Doppler evidence of grade I diastolic dysfunction. Estimated EF 65%. 2. Mild LA enlargement. 3. Severe aortic stenosis with progression since last year 4. Mild (grade I) mild regurgitation. Mild mitral annular calcification. 5. Trace tricuspid regurgitation.  If CBC acceptable DOS, I anticipate pt can proceed as scheduled.   Amy Cass, FNP-BC [facility]/Anesthesiology Phone: [phone] 04/08/2017 12:06 PM

## 2017-04-08 NOTE — Progress Notes (Signed)
[facility]  CARDIOTHORACIC SURGERY CONSULTATION REPORT  Referring Provider is Jacolyn Reedy, MD PCP is Harlan Stains, MD  Chief Complaint  Patient presents with  . Aortic Stenosis    2ND TAVR EVAL...consulted with Dr. Cyndia Bent on 02/27/17...dental clearance on 03/13/17 after tooth extraction    HPI:  Patient is an 83 year old female with aortic stenosis, chronic diastolic congestive heart failure, type 2 diabetes mellitus without complications, hypertension, hyperlipidemia, COPD, asthma, and depression referred for second surgical opinion to discuss treatment options for management of severe symptomatic aortic stenosis. Patient states that she was first noted to have a heart murmur on physical exam at her primary care physician's office more than one year ago. She was referred to Dr. Wynonia Lawman and transthoracic echocardiogram revealed moderate aortic stenosis with normal left ventricular systolic function. Over the past year the patient has developed progressive symptoms of exertional shortness of breath and fatigue. She was seen in follow-up recently by Dr. Wynonia Lawman and echocardiogram revealed significant progression in the severity of the patient's aortic stenosis with peak velocity across the aortic valve measured greater than 4.2 m/s corresponding to mean transvalvular gradient estimated 43 mmHg. She was referred to Dr. Burt Knack for consultation and subsequently underwent left and right heart catheterization on 02/20/2017. She was found to have severe aortic stenosis with mean transvalvular gradient measured 28 mmHg at catheterization corresponding to aortic valve area calculated 1.0 cm. She was also noted to have ostial stenosis of the right coronary artery angiographically appears 50% but at times appeared worse, suspicious for possible vasospasm. There was otherwise only moderate nonobstructive disease in the left coronary system. The patient  subsequently underwent CT angiography with FFR analysis of the right coronary stenosis, which was found to be significant. She was evaluated in consultation by Dr. Cyndia Bent and felt to be at least moderately risk for conventional surgery because of the patient's advanced age. She subsequently underwent uncomplicated PCI and stenting of the ostial right coronary artery stenosis by Dr. Burt Knack using a drug eluding stent on 03/11/2017.  Plans have been made for transcatheter aortic valve replacement in the patient was referred for a second surgical opinion.  The patient is widowed and lives alone in [place].  She is accompanied by her son and daughter for her office consultation today, both of whom live close by.  The patient has remained functionally independent for the last several years, but she admits that over the past year she has been declining physically. Her primary complaint is that of progressive fatigue and exertional shortness of breath. Symptoms have progressed considerably over the past year, and she now gets short of breath and tired with very mild activity. She had previously blames her episodes of shortness of breath on asthma. She also reports occasional episodes of substernal chest tightness. She denies resting shortness of breath but she hasn't experienced episodes of PND. She has had dizzy spells without syncopal episodes, and she states that at times she has brief spells where she simply loses control of her body faculties and feels as though she might pass out. Because of this she recently stopped driving her automobile. Her mobility is also limited because of some pain in her back and poor balance.  She states that her symptoms of fatigue and exertional shortness of breath had not improved at all since she recently underwent PCI and stenting.  Past Medical History:  Diagnosis Date  . Anemia   . Anxiety   . Arthritis    "  just about all over" (03/11/2017)  . Asthma   . Cervical cancer  ([place])   . COPD (chronic obstructive pulmonary disease) ([place])   . Coronary artery disease   . Depression   . Diabetes mellitus without complication ([place])    pt denies this hx on 03/11/2017, patient denies this states she has low blood sugar  . Heart murmur   . Hyperlipidemia   . Low blood sugar   . Migraine    "work related; quit when I quit work in ~ 1997" (03/11/2017)  . Skin cancer    back    Past Surgical History:  Procedure Laterality Date  . APPENDECTOMY    . CARDIAC CATHETERIZATION  02/2017  . CATARACT EXTRACTION W/ INTRAOCULAR LENS  IMPLANT, BILATERAL Bilateral   . CORONARY ANGIOPLASTY WITH STENT PLACEMENT  03/11/2017  . CORONARY STENT INTERVENTION N/A 03/11/2017   Procedure: Coronary Stent Intervention;  Surgeon: Sherren Mocha, MD;  Location: [facility];  Service: Cardiovascular;  Laterality: N/A;  . IR RADIOLOGY PERIPHERAL GUIDED IV START  02/26/2017  . IR US GUIDE VASC ACCESS RIGHT  02/26/2017  . RIGHT/LEFT HEART CATH AND CORONARY ANGIOGRAPHY N/A 02/20/2017   Procedure: Right/Left Heart Cath and Coronary Angiography;  Surgeon: Sherren Mocha, MD;  Location: [facility];  Service: Cardiovascular;  Laterality: N/A;  . SKIN CANCER EXCISION     "back"  . VAGINAL HYSTERECTOMY      History reviewed. No pertinent family history.  Social History   Social History  . Marital status: Widowed    Spouse name: N/A  . Number of children: 3  . Years of education: N/A   Occupational History  . Not on file.   Social History Main Topics  . Smoking status: Never Smoker  . Smokeless tobacco: Never Used  . Alcohol use No  . Drug use: No  . Sexual activity: Not on file   Other Topics Concern  . Not on file   Social History Narrative  . No narrative on file    Current Outpatient Prescriptions  Medication Sig Dispense Refill  . albuterol (ACCUNEB) 0.63 MG/3ML nebulizer solution Take 1 ampule by nebulization every 6 (six) hours as needed for wheezing.    Marland Kitchen  aspirin EC 81 MG tablet Take 81 mg by mouth daily.    . calcium-vitamin D (OSCAL WITH D) 500-200 MG-UNIT tablet Take 1 tablet by mouth 2 (two) times daily.    . Cholecalciferol (VITAMIN D3) 1000 units CAPS Take 1,000 Units by mouth daily with lunch.     . clopidogrel (PLAVIX) 75 MG tablet Take 1 tablet (75 mg total) by mouth daily. 30 tablet 11  . FLOVENT HFA 220 MCG/ACT inhaler Take 1 puff by mouth daily as needed (wheezing). sleep  12  . loperamide (IMODIUM A-D) 2 MG tablet Take 2 mg by mouth as needed for diarrhea or loose stools.    Marland Kitchen LORazepam (ATIVAN) 0.5 MG tablet Take 0.5 mg by mouth at bedtime as needed for sleep.   2  . metoprolol tartrate (LOPRESSOR) 25 MG tablet Take 1 tablet (25 mg total) by mouth 2 (two) times daily. 60 tablet 6  . Multiple Vitamins-Minerals (ICAPS AREDS 2 PO) Take 1 capsule by mouth 2 (two) times daily.    . nitroGLYCERIN (NITROSTAT) 0.4 MG SL tablet Place 1 tablet (0.4 mg total) under the tongue every 5 (five) minutes as needed. (Patient taking differently: Place 0.4 mg under the tongue every 5 (five) minutes as needed for  chest pain. ) 25 tablet 3  . rosuvastatin (CRESTOR) 10 MG tablet Take 10 mg by mouth daily with supper.     . VENTOLIN HFA 108 (90 BASE) MCG/ACT inhaler Inhale 2 puffs into the lungs 2 (two) times daily as needed for wheezing or shortness of breath.   2   No current facility-administered medications for this visit.     Allergies  Allergen Reactions  . Alendronate Sodium Other (See Comments)    Patient denies this allergy   . Codeine Nausea And Vomiting  . Latex Hives and Itching    Caused blisters in her mouth  . Lisinopril     Weakness   . Morphine And Related Nausea And Vomiting  . Penicillins Other (See Comments)    Intolerance to ALL "CILLINS" Has patient had a PCN reaction causing immediate rash, facial/tongue/throat swelling, SOB or lightheadedness with hypotension: unknown Has patient had a PCN reaction causing severe rash  involving mucus membranes or skin necrosis: unknown Has patient had a PCN reaction that required hospitalization unknown Has patient had a PCN reaction occurring within the last 10 years: unknown If all of the above answers are "NO", then may proceed with Cephalosporin use.   Christy Sartorius Sodium] Other (See Comments)    Leg pain   . Prozac [Fluoxetine Hcl]     Feels shaky   . Symbicort [Budesonide-Formoterol Fumarate]     shakey   . Wellbutrin [Bupropion]     Jittery       Review of Systems:   General:  normal appetite, decreased energy, no weight gain, no weight loss, no fever  Cardiac:  + chest pain with exertion, no chest pain at rest, + SOB with exertion, no resting SOB, + PND, no orthopnea, no palpitations, no arrhythmia, no atrial fibrillation, trace LE edema, + dizzy spells, no syncope  Respiratory:  + shortness of breath, no home oxygen, + productive cough, + dry cough, no bronchitis, + wheezing, no hemoptysis, + asthma, no pain with inspiration or cough, no sleep apnea, no CPAP at night  GI:   no difficulty swallowing, no reflux, no frequent heartburn, no hiatal hernia, no abdominal pain, no constipation, no diarrhea, no hematochezia, no hematemesis, no melena  GU:   no dysuria,  no frequency, no urinary tract infection, no hematuria, no kidney stones, no kidney disease  Vascular:  no pain suggestive of claudication, no pain in feet, no leg cramps, no varicose veins, no DVT, no non-healing foot ulcer  Neuro:   no stroke, no TIA's, no seizures, no headaches, no temporary blindness one eye,  no slurred speech, no peripheral neuropathy, + chronic pain, + mild instability of gait, + mild memory/cognitive dysfunction  Musculoskeletal: + arthritis, no joint swelling, no myalgias, mild difficulty walking, diminished mobility   Skin:   no rash, no itching, no skin infections, no pressure sores or ulcerations  Psych:   no anxiety, no depression, no nervousness, no unusual  recent stress  Eyes:   no blurry vision, no floaters, no recent vision changes, no wears glasses or contacts  ENT:   no hearing loss, no loose or painful teeth, upper dentures, last saw dentist within past 2 weeks  Hematologic:  no easy bruising, no abnormal bleeding, no clotting disorder, no frequent epistaxis  Endocrine:  no diabetes, does not check CBG's at home           Physical Exam:   BP (!) 181/69 (BP Location: Right Arm, Patient Position: Sitting, Cuff  Size: Large)   Pulse 76   Resp 16   Ht 5' 6"  (1.676 m)   Wt 164 lb (74.4 kg)   SpO2 95% Comment: ON RA  BMI 26.47 kg/m   General:  Elderly, somewhat fraill-appearing  HEENT:  Unremarkable   Neck:   no JVD, no bruits, no adenopathy   Chest:   clear to auscultation, symmetrical breath sounds, no wheezes, no rhonchi   CV:   RRR, grade IV/VI crescendo/decrescendo murmur heard best at RUSB,  no diastolic murmur  Abdomen:  soft, non-tender, no masses   Extremities:  warm, well-perfused, pulses diminished, no LE edema  Rectal/GU  Deferred  Neuro:   Grossly non-focal and symmetrical throughout  Skin:   Clean and dry, no rashes, no breakdown   Diagnostic Tests:  TRANSTHORACIC ECHOCARDIOGRAM  Both report and echocardiogram performed at Dr. Thurman Coyer office are reviewed. The patient's aortic valve appears trileaflet with severe thickening, calcification, and restricted leaflet mobility involving all 3 leaflets of the patient's aortic valve. Peak velocity across the aortic valve measured 4.25 m/s corresponding to mean transvalvular gradient estimated 43 mmHg. Left ventricular systolic function remained normal with ejection fraction estimated 65%.   Right/Left Heart Cath and Coronary Angiography  Conclusion   1. Mild-Moderate ostial RCA stenosis, component of catheter-induced vasospasm 2. Patent left main, LAD, LCx with minor nonobstructive CAD 3. Severe calcific aortic stenosis  Recommend: Continued TAVR evaluation. I do  not think the ostial RCA is hemodynamically significant and likely a major component of catheter-induced vasospasm is present. Can further assess with gated cardiac CTA which will be done as part of her TAVR workup.  Indications   Severe aortic stenosis [I35.0 (ICD-10-CM)]  Procedural Details/Technique   Technical Details INDICATION: Severe aortic stenosis - Pre TAVR assessment  PROCEDURAL DETAILS: There was an indwelling IV in a right antecubital vein. Using normal sterile technique, the IV was changed out for a 5 Fr brachial sheath over a 0.018 inch wire. The right wrist was then prepped, draped, and anesthetized with 1% lidocaine. Using the modified Seldinger technique a 5/6 French Slender sheath was placed in the right radial artery. Intra-arterial verapamil was administered through the radial artery sheath. IV heparin was administered after a JR4 catheter was advanced into the central aorta. A Swan-Ganz catheter was used for the right heart catheterization. Standard protocol was followed for recording of right heart pressures and sampling of oxygen saturations. Fick cardiac output was calculated. Standard Judkins catheters were used for selective coronary angiography. The aortic valve is crossed with a straight wire directed by a JR4 catheter. Pullback gradients are measured. There is pressure dampening at the RCA ostium as the study progresses. IC NTG is administered and further imaging is done. A 4 Fr JR4 catheter is used to repeat imaging with NTG administered again. There were no immediate procedural complications. The patient was transferred to the post catheterization recovery area for further monitoring.     Estimated blood loss <50 mL.  During this procedure the patient was administered the following to achieve and maintain moderate conscious sedation: Versed 3 mg, Fentanyl 50 mcg, while the patient's heart rate, blood pressure, and oxygen saturation were continuously monitored. The  period of conscious sedation was 53 minutes, of which I was present face-to-face 100% of this time.    Coronary Findings   Dominance: Right  Left Anterior Descending  Prox LAD lesion, 25% stenosed.  Left Circumflex  Prox Cx to Mid Cx lesion, 25% stenosed.  Right Coronary Artery  Ost RCA lesion, 50% stenosed. Probably 40-50% RCA stenosis with catheter-induced vasospasm. Appearance worsens with subsequent injections to 80% stenosis but highly suspicious for vasospasm. Some improvement with IC NTG and repeat imaging with a 4 Fr catheter.  Right Heart   Right Heart Pressures Elevated LV EDP consistent with volume overload.    Left Heart   Aortic Valve Mean gradient 28 mmHg, AVA 1.0 square cm    Coronary Diagrams   Diagnostic Diagram       Implants     No implant documentation for this case.  PACS Images   Show images for Cardiac catheterization   Link to Procedure Log   Procedure Log    Hemo Data    Most Recent Value  Fick Cardiac Output 6.33 L/min  Fick Cardiac Output Index 3.3 (L/min)/BSA  Aortic Mean Gradient 28.3 mmHg  Aortic Peak Gradient 29 mmHg  Aortic Valve Area 1.00  Aortic Value Area Index 0.52 cm2/BSA  RA A Wave 11 mmHg  RA V Wave 9 mmHg  RA Mean 9 mmHg  RV Systolic Pressure 44 mmHg  RV Diastolic Pressure 7 mmHg  RV EDP 12 mmHg  PA Systolic Pressure 41 mmHg  PA Diastolic Pressure 11 mmHg  PA Mean 28 mmHg  AO Systolic Pressure 119 mmHg  AO Diastolic Pressure 78 mmHg  AO Mean 417 mmHg  LV Systolic Pressure 408 mmHg  LV Diastolic Pressure 13 mmHg  LV EDP 18 mmHg  Arterial Occlusion Pressure Extended Systolic Pressure 144 mmHg  Arterial Occlusion Pressure Extended Diastolic Pressure 80 mmHg  Arterial Occlusion Pressure Extended Mean Pressure 120 mmHg  Left Ventricular Apex Extended Systolic Pressure 818 mmHg  Left Ventricular Apex Extended Diastolic Pressure 15 mmHg  Left Ventricular Apex Extended EDP Pressure 20 mmHg  QP/QS 1  TPVR Index 8.49  HRUI  TSVR Index 36.06 HRUI  TPVR/TSVR Ratio 0.24    Coronary Stent Intervention  Conclusion   Final conclusion:   Successful PCI of severe stenosis at the ostium of the RCA, treated with a 4.0 x 12 mm Synergy DES.   Indications   Coronary artery disease with exertional angina (HCC) [H63.149 (ICD-10-CM)]  Procedural Details/Technique   Technical Details INDICATION: Exertional angina.   83 year old woman with known symptomatic severe aortic stenosis. She underwent cardiac catheterization demonstrating high-grade stenosis at the ostium of the right coronary artery. This is also demonstrated on the patient's gated cardiac CTA and an CT-FFR analysis is 0.65. She presents today for PCI of the RCA prior to undergoing TAVR. She has been preloaded with plavix 600 mg this morning.   PROCEDURAL DETAILS: The right wrist was prepped, draped, and anesthetized with 1% lidocaine. Using the modified Seldinger technique, a 5/6 French Slender sheath was introduced into the right radial artery. 3 mg of verapamil was administered through the sheath, weight-based unfractionated heparin was administered intravenously. A 6 French JR4 sidehole guide is used. Heparin is used for anticoagulation. A therapeutic ACT is achieved. See PCI note for further details. There were no immediate procedural complications. A TR band was used for radial hemostasis at the completion of the procedure. The patient was transferred to the post catheterization recovery area for further monitoring.    Estimated blood loss <50 mL.  During this procedure the patient was administered the following to achieve and maintain moderate conscious sedation: Versed 3 mg, Fentanyl 75 mcg, while the patient's heart rate, blood pressure, and oxygen saturation were continuously monitored. The period of conscious sedation was 54 minutes, of which  I was present face-to-face 100% of this time.    Coronary Findings   Dominance: Right  Left Anterior  Descending  Prox LAD lesion, 25% stenosed.  Left Circumflex  Prox Cx to Mid Cx lesion, 25% stenosed.  Right Coronary Artery  Ost RCA lesion, 80% stenosed. Probably 40-50% RCA stenosis with catheter-induced vasospasm. Appearance worsens with subsequent injections to 80% stenosis but highly suspicious for vasospasm. Some improvement with IC NTG and repeat imaging with a 4 Fr catheter.  Angioplasty: Lesion crossed with guidewire using a WIRE COUGAR XT ST. 190CM. Pre-stent angioplasty was performed using a BALLOON EUPHORA [id number]. A STENT SYNERGY DES 4X12 drug eluting stent was successfully placed. Post-stent angioplasty was performed using a BALLOON [place] [employer] EMERGE MR 4.5X8. Maximum pressure: 14 atm. The pre-interventional distal flow is normal (TIMI 3). The post-interventional distal flow is normal (TIMI 3). No complications occurred at this lesion. A JR4 sidehole guide is used. The patient has been adequately preloaded with clopidogrel. Heparin is used for anticoagulation and a therapeutic ACT is achieved. The lesion is predilated with a 3.0 mm balloon. The lesion is a true ostial stenosis. The lesion is stented with a 4.0 x 12 mm Synergy DES with full lesion coverage. The stent is postdilated with a 4.5 mm noncompliant balloon with an appearance of good stent expansion. The patient tolerated the procedure well. She did have significant chest pain while positioning the stent because of the high-grade stenosis in the ostial RCA. This resolved after stent deployment.  There is no residual stenosis post intervention.    Cardiac TAVR CT  TECHNIQUE: The patient was scanned on a Philips 256 scanner. A 120 kV retrospective scan was triggered in the descending thoracic aorta at 111 HU's. Gantry rotation speed was 270 msecs and collimation was .9 mm. 10 mg of iv metoprolol or 0.4 mg of sl NTG were given. The 3D data set was reconstructed in 5% intervals of the R-R cycle. Systolic and diastolic phases were  analyzed on a dedicated work station using MPR, MIP and VRT modes. The patient received 80 cc of contrast.  FINDINGS: Aortic Valve: Trileaflet, moderately thickened, mildly calcified with no calcium extending into LVOT.  Aorta:  Mild diffuse calcifications.  No dissection.  Sinotubular Junction:  23 x 22 mm  Ascending Thoracic Aorta:  29 x 28 mm  Aortic Arch:  25 x 24 mm  Descending Thoracic Aorta:  20 x 19 mm  Sinus of Valsalva Measurements:  Non-coronary:  24 mm  Right -coronary:  22 mm  Left -coronary:  25 mm  Coronary Artery Height above Annulus:  Left Main:  12 mm  Right Coronary:  13 mm  Virtual Basal Annulus Measurements:  Maximum/Minimum Diameter:  23 x 17 mm  Perimeter:  70 mm  Area:  297 mm2  Coronary Arteries:  Normal origin, right dominance.  RCA is a very large dominant artery giving rise to PDA and PLVB. There is a severe mixed, predominantly noncalcified plaque in the ostial RCA suspicious for > 70 stenosis. Minimal plaque in the remaining RCA.  LM is a large long vessel with no plaque.  LAD is a large vessel that gives rise to one diagonal branch. There is a long calcific lesion in the proximal LAD extending into diagonal branch associated with 25-50% stenosis.  LCX is a small non-dominant vessel that has only minimal noncalcified plaque.  Optimum Fluoroscopic Angle for Delivery:  LAO 8, CAU 9  IMPRESSION: 1. Trileaflet, moderately thickened, mildly calcified aortic valve  with no calcium extending into LVOT. Annular measurements suitable for delivery of a 23 mm Edwards-SAPIEN 3 valve.  2. Sufficient annulus to coronary distance.  3. Optimum Fluoroscopic Angle for Delivery: LAO 8, CAU 9  4. There is a severe mixed, predominantly noncalcified plaque in the ostial RCA suspicious for > 70 stenosis. Please see attachment for full CT FFR analysis. Ostial RCA FFR 0.65. Revascularization  is recommended.  Ena Dawley    CT ANGIOGRAPHY CHEST, ABDOMEN AND PELVIS  TECHNIQUE: Multidetector CT imaging through the chest, abdomen and pelvis was performed using the standard protocol during bolus administration of intravenous contrast. Multiplanar reconstructed images and MIPs were obtained and reviewed to evaluate the vascular anatomy.  CONTRAST:  80 mL of Isovue 370.  COMPARISON:  Chest CT 07/11/2010. CT the abdomen and pelvis 10/24/2009.  FINDINGS: CTA CHEST FINDINGS  Cardiovascular: Heart size is normal. There is no significant pericardial fluid, thickening or pericardial calcification. There is aortic atherosclerosis, as well as atherosclerosis of the great vessels of the mediastinum and the coronary arteries, including calcified atherosclerotic plaque in the left anterior descending coronary artery. Thickening calcification of the aortic valve, compatible with the reported clinical history of severe aortic stenosis.  Mediastinum/Lymph Nodes: No pathologically enlarged mediastinal or hilar lymph nodes. Small hiatal hernia. No axillary lymphadenopathy.  Lungs/Pleura: No acute consolidative airspace disease. No pleural effusions. No suspicious appearing pulmonary nodules or masses. Mild linear scarring in the lung bases bilaterally.  Musculoskeletal/Soft Tissues: There are no aggressive appearing lytic or blastic lesions noted in the visualized portions of the skeleton.  CTA ABDOMEN AND PELVIS FINDINGS  Hepatobiliary: No cystic or solid hepatic lesions. No intra or extrahepatic biliary ductal dilatation. Gallbladder is normal in appearance.  Pancreas: No pancreatic mass. No pancreatic ductal dilatation. No pancreatic or peripancreatic fluid or inflammatory changes.  Spleen: Unremarkable.  Adrenals/Urinary Tract: Multiple tiny 2-3 mm low-attenuation lesions in both kidneys are too small to characterize, but are statistically likely to  represent tiny cysts. Bilateral adrenal glands are normal in appearance. No hydroureteronephrosis. Urinary bladder is normal in appearance.  Stomach/Bowel: The appearance of the stomach is normal. No pathologic dilatation of small bowel or colon. Numerous colonic diverticulae are noted, particularly in the sigmoid colon, without surrounding inflammatory changes to suggest an acute diverticulitis at this time. Normal appendix.  Vascular/Lymphatic: Aortic atherosclerosis, without evidence of aneurysm or dissection in the abdominal or pelvic vasculature. Vascular measurements pertinent to potential TAVR procedure, as detailed below. The celiac axis, superior mesenteric artery and inferior mesenteric artery are all widely patent without hemodynamically significant stenosis. Single renal arteries bilaterally are widely patent. No lymphadenopathy noted in the abdomen or pelvis.  Reproductive: Status post hysterectomy. Ovaries are not confidently identified may be surgically absent or atrophic.  Other: No significant volume of ascites.  No pneumoperitoneum.  Musculoskeletal: There are no aggressive appearing lytic or blastic lesions noted in the visualized portions of the skeleton.  VASCULAR MEASUREMENTS PERTINENT TO TAVR:  AORTA:  Minimal Aortic Diameter -  11 x 11 mm  Severity of Aortic Calcification -  moderate  RIGHT PELVIS:  Right Common Iliac Artery -  Minimal Diameter - 7.9 x 8.7 mm  Tortuosity - mild  Calcification - mild  Right External Iliac Artery -  Minimal Diameter - 5.9 x 6.1 mm  Tortuosity - mild  Calcification - none  Right Common Femoral Artery -  Minimal Diameter - 6.3 x 6.8 mm  Tortuosity - mild  Calcification - none  LEFT PELVIS:  Left Common Iliac Artery -  Minimal Diameter - 8.2 x 7.7 mm  Tortuosity - mild  Calcification - none  Left External Iliac Artery -  Minimal Diameter - 6.2 x 6.2  mm  Tortuosity - mild  Calcification - none  Left Common Femoral Artery -  Minimal Diameter - 6.3 x 7.0 mm  Tortuosity - mild  Calcification - none  Review of the MIP images confirms the above findings.  IMPRESSION: 1. Vascular findings and measurements pertinent to potential TAVR procedure, as detailed above. This patient has suitable pelvic arterial access bilaterally. 2. Thickening calcification of the aortic valve, compatible with the reported clinical history of severe aortic stenosis. 3. Aortic atherosclerosis, in addition to left anterior descending coronary artery disease. 4. Small hiatal hernia. 5. Colonic diverticulosis without evidence of acute diverticulitis at this time. 6. Additional incidental findings, as above.   Electronically Signed   By: Vinnie Langton M.D.   On: 02/26/2017 14:44    STS Risk Calculator  Procedure    AVR  Risk of Mortality   4.7% Morbidity or Mortality  20.4% Prolonged LOS   10.5% Short LOS    19.8% Permanent Stroke   2.2% Prolonged Vent Support  14.6% DSW Infection    0.3% Renal Failure    5.3% Reoperation    7.6%    Impression:  Patient has stage D severe symptomatic aortic stenosis and single-vessel coronary artery disease. She presents with progressive symptoms of exertional shortness of breath and fatigue consistent with chronic diastolic congestive heart failure, New York Heart Association functional class III. She also experiences atypical chest tightness and dizzy spells without syncope.  She recently underwent PCI and stenting of her hemodynamically significant ostial right coronary artery stenosis. This has not been associated with any significant improvement in the patient's symptoms. I have personally reviewed the patient's recent transthoracic echocardiogram, diagnostic cardiac catheterization and PCI, and CT angiograms. Echocardiogram reveals severe aortic stenosis with severe thickening, restricted  leaflet mobility, and calcification involving all 3 leaflets of the patient's aortic valve. Peak velocity across the aortic valve measured 4.25 m/s corresponding to mean transvalvular gradient estimated 43 mmHg. Left ventricular systolic function remains normal. Diagnostic catheterization revealed ostial right coronary artery stenosis and otherwise moderate nonobstructive disease. Risks associated with conventional surgery would be at least moderately elevated because of the patient's advanced age and morbid medical problems. She has been declining physically over the past year and I suspect might have significant problems recovering from conventional surgery. I agree that transcatheter aortic valve replacement would likely be far less invasive and potentially less risky.  Cardiac-gated CTA of the heart reveals anatomical characteristics consistent with aortic stenosis suitable for treatment by transcatheter aortic valve replacement without any significant complicating features, although her aortic annulus is relatively small.  CTA of the aorta and iliac vessels demonstrate what appears to be adequate pelvic vascular access to facilitate a transfemoral approach.    Plan:  The patient and her family were counseled at length regarding treatment alternatives for management of severe symptomatic aortic stenosis. Alternative approaches such as conventional aortic valve replacement, transcatheter aortic valve replacement, and palliative medical therapy were compared and contrasted at length.  The risks associated with conventional surgical aortic valve replacement were been discussed in detail, as were expectations for post-operative convalescence, and why I would be reluctant to consider this patient a candidate for conventional surgery.  Issues specific to transcatheter aortic valve replacement were discussed including questions about long term valve durability, the  potential for paravalvular leak, possible  increased risk of need for permanent pacemaker placement, and other technical complications related to the procedure itself.  Long-term prognosis with medical therapy was discussed. This discussion was placed in the context of the patient's own specific clinical presentation and past medical history.  All of their questions been addressed.  The patient looks forward to proceeding with TAVR tomorrow as previously planned.  Following the decision to proceed with transcatheter aortic valve replacement, a discussion has been held regarding what types of management strategies would be attempted intraoperatively in the event of life-threatening complications, including whether or not the patient would be considered a candidate for the use of cardiopulmonary bypass and/or conversion to open sternotomy for attempted surgical intervention.  The patient has been advised of a variety of complications that might develop including but not limited to risks of death, stroke, paravalvular leak, aortic dissection or other major vascular complications, aortic annulus rupture, device embolization, cardiac rupture or perforation, mitral regurgitation, acute myocardial infarction, arrhythmia, heart block or bradycardia requiring permanent pacemaker placement, congestive heart failure, respiratory failure, renal failure, pneumonia, infection, other late complications related to structural valve deterioration or migration, or other complications that might ultimately cause a temporary or permanent loss of functional independence or other long term morbidity.  The patient provides full informed consent for the procedure as described and all questions were answered.   I spent in excess of 90 minutes during the conduct of this office consultation and >50% of this time involved direct face-to-face encounter with the patient for counseling and/or coordination of their care.    Valentina Gu. Roxy Manns, MD 04/08/2017 10:37 AM

## 2017-04-08 NOTE — Patient Instructions (Signed)
  Continue taking all current medications without change through the day before surgery.  Have nothing to eat or drink after midnight the night before surgery.  On the morning of surgery take only metoprolol with a sip of water.    

## 2017-04-09 ENCOUNTER — Inpatient Hospital Stay (HOSPITAL_COMMUNITY): Payer: Medicare Other

## 2017-04-09 ENCOUNTER — Inpatient Hospital Stay (HOSPITAL_COMMUNITY): Payer: Medicare Other | Admitting: Emergency Medicine

## 2017-04-09 ENCOUNTER — Other Ambulatory Visit: Payer: Self-pay

## 2017-04-09 ENCOUNTER — Inpatient Hospital Stay (HOSPITAL_COMMUNITY)
Admission: RE | Admit: 2017-04-09 | Discharge: 2017-04-11 | DRG: 267 | Disposition: A | Discharge status: Home / Self Care | Payer: Medicare Other | Source: Ambulatory Visit | Attending: Cardiovascular Disease | Admitting: Cardiovascular Disease

## 2017-04-09 ENCOUNTER — Encounter (HOSPITAL_COMMUNITY)
Admission: RE | Disposition: A | Discharge status: Home / Self Care | Payer: Self-pay | Source: Ambulatory Visit | Attending: Cardiovascular Disease

## 2017-04-09 ENCOUNTER — Encounter (HOSPITAL_COMMUNITY): Payer: Self-pay | Admitting: *Deleted

## 2017-04-09 ENCOUNTER — Inpatient Hospital Stay (HOSPITAL_COMMUNITY): Payer: Medicare Other | Admitting: Anesthesiology

## 2017-04-09 DIAGNOSIS — Z888 Allergy status to other drugs, medicaments and biological substances status: Secondary | ICD-10-CM | POA: Diagnosis not present

## 2017-04-09 DIAGNOSIS — Z885 Allergy status to narcotic agent status: Secondary | ICD-10-CM | POA: Diagnosis not present

## 2017-04-09 DIAGNOSIS — Z7982 Long term (current) use of aspirin: Secondary | ICD-10-CM

## 2017-04-09 DIAGNOSIS — Z952 Presence of prosthetic heart valve: Secondary | ICD-10-CM

## 2017-04-09 DIAGNOSIS — J449 Chronic obstructive pulmonary disease, unspecified: Secondary | ICD-10-CM | POA: Diagnosis not present

## 2017-04-09 DIAGNOSIS — D62 Acute posthemorrhagic anemia: Secondary | ICD-10-CM | POA: Diagnosis not present

## 2017-04-09 DIAGNOSIS — Z8249 Family history of ischemic heart disease and other diseases of the circulatory system: Secondary | ICD-10-CM | POA: Diagnosis not present

## 2017-04-09 DIAGNOSIS — I10 Essential (primary) hypertension: Secondary | ICD-10-CM | POA: Diagnosis present

## 2017-04-09 DIAGNOSIS — Z006 Encounter for examination for normal comparison and control in clinical research program: Secondary | ICD-10-CM | POA: Diagnosis not present

## 2017-04-09 DIAGNOSIS — I2510 Atherosclerotic heart disease of native coronary artery without angina pectoris: Secondary | ICD-10-CM | POA: Diagnosis present

## 2017-04-09 DIAGNOSIS — Z79899 Other long term (current) drug therapy: Secondary | ICD-10-CM | POA: Diagnosis not present

## 2017-04-09 DIAGNOSIS — M1990 Unspecified osteoarthritis, unspecified site: Secondary | ICD-10-CM | POA: Diagnosis present

## 2017-04-09 DIAGNOSIS — I350 Nonrheumatic aortic (valve) stenosis: Secondary | ICD-10-CM

## 2017-04-09 DIAGNOSIS — Z955 Presence of coronary angioplasty implant and graft: Secondary | ICD-10-CM

## 2017-04-09 DIAGNOSIS — F419 Anxiety disorder, unspecified: Secondary | ICD-10-CM | POA: Diagnosis not present

## 2017-04-09 DIAGNOSIS — Z90710 Acquired absence of both cervix and uterus: Secondary | ICD-10-CM | POA: Diagnosis not present

## 2017-04-09 DIAGNOSIS — E119 Type 2 diabetes mellitus without complications: Secondary | ICD-10-CM | POA: Diagnosis not present

## 2017-04-09 DIAGNOSIS — I340 Nonrheumatic mitral (valve) insufficiency: Secondary | ICD-10-CM | POA: Diagnosis not present

## 2017-04-09 DIAGNOSIS — E785 Hyperlipidemia, unspecified: Secondary | ICD-10-CM | POA: Diagnosis present

## 2017-04-09 DIAGNOSIS — Z8541 Personal history of malignant neoplasm of cervix uteri: Secondary | ICD-10-CM

## 2017-04-09 DIAGNOSIS — Z880 Allergy status to penicillin: Secondary | ICD-10-CM | POA: Diagnosis not present

## 2017-04-09 DIAGNOSIS — R0602 Shortness of breath: Secondary | ICD-10-CM | POA: Diagnosis not present

## 2017-04-09 DIAGNOSIS — I5032 Chronic diastolic (congestive) heart failure: Secondary | ICD-10-CM | POA: Diagnosis present

## 2017-04-09 DIAGNOSIS — Z9861 Coronary angioplasty status: Secondary | ICD-10-CM

## 2017-04-09 DIAGNOSIS — I517 Cardiomegaly: Secondary | ICD-10-CM | POA: Diagnosis not present

## 2017-04-09 DIAGNOSIS — Z91040 Latex allergy status: Secondary | ICD-10-CM

## 2017-04-09 DIAGNOSIS — I083 Combined rheumatic disorders of mitral, aortic and tricuspid valves: Secondary | ICD-10-CM | POA: Diagnosis not present

## 2017-04-09 DIAGNOSIS — Z954 Presence of other heart-valve replacement: Secondary | ICD-10-CM | POA: Diagnosis not present

## 2017-04-09 DIAGNOSIS — F329 Major depressive disorder, single episode, unspecified: Secondary | ICD-10-CM | POA: Diagnosis present

## 2017-04-09 HISTORY — PX: TEE WITHOUT CARDIOVERSION: SHX5443

## 2017-04-09 HISTORY — PX: TRANSCATHETER AORTIC VALVE REPLACEMENT, TRANSFEMORAL: SHX6400

## 2017-04-09 LAB — POCT I-STAT 3, ART BLOOD GAS (G3+)
ACID-BASE DEFICIT: 1 mmol/L (ref 0.0–2.0)
Acid-Base Excess: 3 mmol/L — ABNORMAL HIGH (ref 0.0–2.0)
Acid-base deficit: 4 mmol/L — ABNORMAL HIGH (ref 0.0–2.0)
Bicarbonate: 27.2 mmol/L (ref 20.0–28.0)
Bicarbonate: 23.5 mmol/L (ref 20.0–28.0)
Bicarbonate: 21.9 mmol/L (ref 20.0–28.0)
O2 SAT: 98 %
O2 Saturation: 100 %
O2 Saturation: 100 %
PCO2 ART: 38.7 mmHg (ref 32.0–48.0)
PH ART: 7.422 (ref 7.350–7.450)
PO2 ART: 371 mmHg — AB (ref 83.0–108.0)
PO2 ART: 108 mmHg (ref 83.0–108.0)
Patient temperature: 95.2
TCO2: 28 mmol/L (ref 0–100)
TCO2: 25 mmol/L (ref 0–100)
TCO2: 23 mmol/L (ref 0–100)
pCO2 arterial: 41.8 mmHg (ref 32.0–48.0)
pCO2 arterial: 34.2 mmHg (ref 32.0–48.0)
pH, Arterial: 7.445 (ref 7.350–7.450)
pH, Arterial: 7.353 (ref 7.350–7.450)
pO2, Arterial: 181 mmHg — ABNORMAL HIGH (ref 83.0–108.0)

## 2017-04-09 LAB — POCT I-STAT, CHEM 8
BUN: 16 mg/dL (ref 6–20)
BUN: 16 mg/dL (ref 6–20)
BUN: 16 mg/dL (ref 6–20)
BUN: 14 mg/dL (ref 6–20)
CHLORIDE: 108 mmol/L (ref 101–111)
CHLORIDE: 107 mmol/L (ref 101–111)
CHLORIDE: 108 mmol/L (ref 101–111)
CHLORIDE: 105 mmol/L (ref 101–111)
CREATININE: 0.6 mg/dL (ref 0.44–1.00)
CREATININE: 0.7 mg/dL (ref 0.44–1.00)
CREATININE: 0.8 mg/dL (ref 0.44–1.00)
Calcium, Ion: 1.16 mmol/L (ref 1.15–1.40)
Calcium, Ion: 1.12 mmol/L — ABNORMAL LOW (ref 1.15–1.40)
Calcium, Ion: 1.13 mmol/L — ABNORMAL LOW (ref 1.15–1.40)
Calcium, Ion: 1.21 mmol/L (ref 1.15–1.40)
Creatinine, Ser: 0.8 mg/dL (ref 0.44–1.00)
GLUCOSE: 128 mg/dL — AB (ref 65–99)
GLUCOSE: 114 mg/dL — AB (ref 65–99)
Glucose, Bld: 110 mg/dL — ABNORMAL HIGH (ref 65–99)
Glucose, Bld: 106 mg/dL — ABNORMAL HIGH (ref 65–99)
HCT: 28 % — ABNORMAL LOW (ref 36.0–46.0)
HCT: 24 % — ABNORMAL LOW (ref 36.0–46.0)
HEMATOCRIT: 34 % — AB (ref 36.0–46.0)
HEMATOCRIT: 22 % — AB (ref 36.0–46.0)
Hemoglobin: 11.6 g/dL — ABNORMAL LOW (ref 12.0–15.0)
Hemoglobin: 7.5 g/dL — ABNORMAL LOW (ref 12.0–15.0)
Hemoglobin: 9.5 g/dL — ABNORMAL LOW (ref 12.0–15.0)
Hemoglobin: 8.2 g/dL — ABNORMAL LOW (ref 12.0–15.0)
POTASSIUM: 3.8 mmol/L (ref 3.5–5.1)
POTASSIUM: 3.7 mmol/L (ref 3.5–5.1)
POTASSIUM: 4.3 mmol/L (ref 3.5–5.1)
Potassium: 3.8 mmol/L (ref 3.5–5.1)
SODIUM: 144 mmol/L (ref 135–145)
Sodium: 143 mmol/L (ref 135–145)
Sodium: 143 mmol/L (ref 135–145)
Sodium: 144 mmol/L (ref 135–145)
TCO2: 28 mmol/L (ref 0–100)
TCO2: 25 mmol/L (ref 0–100)
TCO2: 25 mmol/L (ref 0–100)
TCO2: 28 mmol/L (ref 0–100)

## 2017-04-09 LAB — CBC
HCT: 33.8 % — ABNORMAL LOW (ref 36.0–46.0)
HCT: 26.4 % — ABNORMAL LOW (ref 36.0–46.0)
Hemoglobin: 10.4 g/dL — ABNORMAL LOW (ref 12.0–15.0)
Hemoglobin: 8.3 g/dL — ABNORMAL LOW (ref 12.0–15.0)
MCH: 30.2 pg (ref 26.0–34.0)
MCH: 30.4 pg (ref 26.0–34.0)
MCHC: 30.8 g/dL (ref 30.0–36.0)
MCHC: 31.4 g/dL (ref 30.0–36.0)
MCV: 98.3 fL (ref 78.0–100.0)
MCV: 96.7 fL (ref 78.0–100.0)
PLATELETS: 200 10*3/uL (ref 150–400)
Platelets: 145 10*3/uL — ABNORMAL LOW (ref 150–400)
RBC: 3.44 MIL/uL — ABNORMAL LOW (ref 3.87–5.11)
RBC: 2.73 MIL/uL — ABNORMAL LOW (ref 3.87–5.11)
RDW: 14.1 % (ref 11.5–15.5)
RDW: 14.1 % (ref 11.5–15.5)
WBC: 6 10*3/uL (ref 4.0–10.5)
WBC: 6.7 10*3/uL (ref 4.0–10.5)

## 2017-04-09 LAB — MAGNESIUM: MAGNESIUM: 1.9 mg/dL (ref 1.7–2.4)

## 2017-04-09 LAB — CK: Total CK: 115 U/L (ref 38–234)

## 2017-04-09 SURGERY — IMPLANTATION, AORTIC VALVE, TRANSCATHETER, FEMORAL APPROACH
Anesthesia: General | Site: Chest

## 2017-04-09 MED ORDER — ALBUTEROL SULFATE (2.5 MG/3ML) 0.083% IN NEBU: 3.0000 mL | INHALATION_SOLUTION | Freq: Four times a day (QID) | RESPIRATORY_TRACT | Status: DC | PRN

## 2017-04-09 MED ORDER — LACTATED RINGERS IV SOLN
INTRAVENOUS | Status: DC | PRN
  Administered 2017-04-09: 07:00:00 via INTRAVENOUS

## 2017-04-09 MED ORDER — LACTATED RINGERS IV SOLN: 500.0000 mL | Freq: Once | INTRAVENOUS | Status: DC | PRN

## 2017-04-09 MED ORDER — VANCOMYCIN HCL IN DEXTROSE 1-5 GM/200ML-% IV SOLN
1000.0000 mg | Freq: Once | INTRAVENOUS | Status: AC
  Administered 2017-04-09: 1000 mg via INTRAVENOUS
  Filled 2017-04-09: qty 200

## 2017-04-09 MED ORDER — MIDAZOLAM HCL 2 MG/2ML IJ SOLN: 2.0000 mg | INTRAMUSCULAR | Status: DC | PRN

## 2017-04-09 MED ORDER — IODIXANOL 320 MG/ML IV SOLN
INTRAVENOUS | Status: DC | PRN
  Administered 2017-04-09: 150 mL via INTRAVENOUS

## 2017-04-09 MED ORDER — LEVOFLOXACIN IN D5W 750 MG/150ML IV SOLN
750.0000 mg | INTRAVENOUS | Status: AC
  Administered 2017-04-10: 750 mg via INTRAVENOUS
  Filled 2017-04-09: qty 150

## 2017-04-09 MED ORDER — ONDANSETRON HCL 4 MG/2ML IJ SOLN: 4.0000 mg | Freq: Four times a day (QID) | INTRAMUSCULAR | Status: DC | PRN

## 2017-04-09 MED ORDER — ONDANSETRON HCL 4 MG/2ML IJ SOLN
INTRAMUSCULAR | Status: DC | PRN
  Administered 2017-04-09 (×2): 4 mg via INTRAVENOUS

## 2017-04-09 MED ORDER — NITROGLYCERIN 0.4 MG SL SUBL: 0.4000 mg | SUBLINGUAL_TABLET | SUBLINGUAL | Status: DC | PRN

## 2017-04-09 MED ORDER — PROPOFOL 10 MG/ML IV BOLUS
INTRAVENOUS | Status: DC | PRN
  Administered 2017-04-09: 70 mg via INTRAVENOUS

## 2017-04-09 MED ORDER — CHLORHEXIDINE GLUCONATE 0.12 % MT SOLN
15.0000 mL | OROMUCOSAL | Status: AC
  Administered 2017-04-09: 15 mL via OROMUCOSAL

## 2017-04-09 MED ORDER — PROMETHAZINE HCL 25 MG/ML IJ SOLN: 12.5000 mg | Freq: Once | INTRAMUSCULAR | Status: DC | PRN

## 2017-04-09 MED ORDER — METOPROLOL TARTRATE 5 MG/5ML IV SOLN
2.5000 mg | INTRAVENOUS | Status: DC | PRN
  Administered 2017-04-09: 2.5 mg via INTRAVENOUS
  Administered 2017-04-09 (×2): 5 mg via INTRAVENOUS
  Filled 2017-04-09 (×2): qty 5

## 2017-04-09 MED ORDER — LORAZEPAM 0.5 MG PO TABS: 0.5000 mg | ORAL_TABLET | Freq: Every evening | ORAL | Status: DC | PRN

## 2017-04-09 MED ORDER — SUGAMMADEX SODIUM 200 MG/2ML IV SOLN
INTRAVENOUS | Status: DC | PRN
  Administered 2017-04-09: 150 mg via INTRAVENOUS

## 2017-04-09 MED ORDER — FENTANYL CITRATE (PF) 100 MCG/2ML IJ SOLN
50.0000 ug | INTRAMUSCULAR | Status: DC | PRN
Start: 2017-04-09 — End: 2017-04-10

## 2017-04-09 MED ORDER — CHLORHEXIDINE GLUCONATE 4 % EX LIQD: 60.0000 mL | Freq: Once | CUTANEOUS | Status: DC

## 2017-04-09 MED ORDER — SUGAMMADEX SODIUM 200 MG/2ML IV SOLN
INTRAVENOUS | Status: AC
  Filled 2017-04-09: qty 2

## 2017-04-09 MED ORDER — HEPARIN SODIUM (PORCINE) 1000 UNIT/ML IJ SOLN
INTRAMUSCULAR | Status: DC | PRN
  Administered 2017-04-09: 12000 [IU] via INTRAVENOUS

## 2017-04-09 MED ORDER — ENSURE ENLIVE PO LIQD
237.0000 mL | Freq: Two times a day (BID) | ORAL | Status: DC
  Administered 2017-04-11: 237 mL via ORAL

## 2017-04-09 MED ORDER — CHLORHEXIDINE GLUCONATE 4 % EX LIQD: 30.0000 mL | CUTANEOUS | Status: DC

## 2017-04-09 MED ORDER — NOREPINEPHRINE BITARTRATE 1 MG/ML IV SOLN
INTRAVENOUS | Status: DC | PRN
  Administered 2017-04-09: 2 ug/min via INTRAVENOUS
  Administered 2017-04-09: 6 ug/min via INTRAVENOUS

## 2017-04-09 MED ORDER — PHENYLEPHRINE HCL 10 MG/ML IJ SOLN
INTRAMUSCULAR | Status: DC | PRN
  Administered 2017-04-09: 10 ug/min via INTRAVENOUS

## 2017-04-09 MED ORDER — CLOPIDOGREL BISULFATE 75 MG PO TABS
75.0000 mg | ORAL_TABLET | Freq: Every day | ORAL | Status: DC
Start: 2017-04-10 — End: 2017-04-11
  Administered 2017-04-10 – 2017-04-11 (×2): 75 mg via ORAL
  Filled 2017-04-09 (×2): qty 1

## 2017-04-09 MED ORDER — PROTAMINE SULFATE 10 MG/ML IV SOLN
INTRAVENOUS | Status: AC
  Filled 2017-04-09: qty 50

## 2017-04-09 MED ORDER — HEPARIN SODIUM (PORCINE) 1000 UNIT/ML IJ SOLN
INTRAMUSCULAR | Status: AC
  Filled 2017-04-09: qty 2

## 2017-04-09 MED ORDER — NITROGLYCERIN IN D5W 200-5 MCG/ML-% IV SOLN
0.0000 ug/min | INTRAVENOUS | Status: DC
Start: 2017-04-09 — End: 2017-04-10
  Administered 2017-04-09: 5 ug/min via INTRAVENOUS

## 2017-04-09 MED ORDER — SODIUM CHLORIDE 0.9 % IV SOLN
0.0000 ug/min | INTRAVENOUS | Status: DC
  Filled 2017-04-09: qty 2

## 2017-04-09 MED ORDER — ALBUMIN HUMAN 5 % IV SOLN: 250.0000 mL | INTRAVENOUS | Status: DC | PRN

## 2017-04-09 MED ORDER — LIDOCAINE 2% (20 MG/ML) 5 ML SYRINGE
INTRAMUSCULAR | Status: AC
  Filled 2017-04-09: qty 5

## 2017-04-09 MED ORDER — ROSUVASTATIN CALCIUM 10 MG PO TABS
10.0000 mg | ORAL_TABLET | Freq: Every day | ORAL | Status: DC
  Administered 2017-04-09 – 2017-04-10 (×2): 10 mg via ORAL
  Filled 2017-04-09 (×2): qty 1

## 2017-04-09 MED ORDER — FAMOTIDINE IN NACL 20-0.9 MG/50ML-% IV SOLN: 20.0000 mg | Freq: Two times a day (BID) | INTRAVENOUS | Status: DC

## 2017-04-09 MED ORDER — SODIUM CHLORIDE 0.9 % IV SOLN
1.0000 mL/kg/h | INTRAVENOUS | Status: AC
  Administered 2017-04-09: 1 mL/kg/h via INTRAVENOUS

## 2017-04-09 MED ORDER — ONDANSETRON HCL 4 MG/2ML IJ SOLN
INTRAMUSCULAR | Status: AC
  Filled 2017-04-09: qty 2

## 2017-04-09 MED ORDER — FENTANYL CITRATE (PF) 100 MCG/2ML IJ SOLN
INTRAMUSCULAR | Status: DC | PRN
  Administered 2017-04-09 (×2): 50 ug via INTRAVENOUS

## 2017-04-09 MED ORDER — SODIUM CHLORIDE 0.9 % IV SOLN
250.0000 mL | INTRAVENOUS | Status: DC | PRN
Start: 2017-04-09 — End: 2017-04-10

## 2017-04-09 MED ORDER — 0.9 % SODIUM CHLORIDE (POUR BTL) OPTIME
TOPICAL | Status: DC | PRN
  Administered 2017-04-09: 1000 mL

## 2017-04-09 MED ORDER — ALBUMIN HUMAN 5 % IV SOLN
INTRAVENOUS | Status: DC | PRN
  Administered 2017-04-09 (×2): via INTRAVENOUS

## 2017-04-09 MED ORDER — FENTANYL CITRATE (PF) 250 MCG/5ML IJ SOLN
INTRAMUSCULAR | Status: AC
  Filled 2017-04-09: qty 5

## 2017-04-09 MED ORDER — PROPOFOL 10 MG/ML IV BOLUS
INTRAVENOUS | Status: AC
  Filled 2017-04-09: qty 20

## 2017-04-09 MED ORDER — METOPROLOL TARTRATE 25 MG PO TABS
25.0000 mg | ORAL_TABLET | Freq: Two times a day (BID) | ORAL | Status: DC
  Administered 2017-04-09: 25 mg via ORAL
  Filled 2017-04-09: qty 1

## 2017-04-09 MED ORDER — PROTAMINE SULFATE 10 MG/ML IV SOLN
INTRAVENOUS | Status: DC | PRN
  Administered 2017-04-09: 120 mg via INTRAVENOUS

## 2017-04-09 MED ORDER — PANTOPRAZOLE SODIUM 40 MG PO TBEC
40.0000 mg | DELAYED_RELEASE_TABLET | Freq: Every day | ORAL | Status: DC
  Administered 2017-04-10: 40 mg via ORAL
  Filled 2017-04-09: qty 1

## 2017-04-09 MED ORDER — TRAMADOL HCL 50 MG PO TABS
50.0000 mg | ORAL_TABLET | ORAL | Status: DC | PRN
Start: 2017-04-09 — End: 2017-04-10

## 2017-04-09 MED ORDER — SODIUM CHLORIDE 0.9 % IV SOLN
INTRAVENOUS | Status: DC | PRN
  Administered 2017-04-09 (×3): 500 mL

## 2017-04-09 MED ORDER — REMIFENTANIL HCL 1 MG IV SOLR
INTRAVENOUS | Status: DC | PRN
  Administered 2017-04-09: .5 ug/kg/min via INTRAVENOUS

## 2017-04-09 MED ORDER — ALBUTEROL SULFATE HFA 108 (90 BASE) MCG/ACT IN AERS: 2.0000 | INHALATION_SPRAY | Freq: Two times a day (BID) | RESPIRATORY_TRACT | Status: DC | PRN

## 2017-04-09 MED ORDER — ROCURONIUM BROMIDE 100 MG/10ML IV SOLN
INTRAVENOUS | Status: DC | PRN
  Administered 2017-04-09: 50 mg via INTRAVENOUS

## 2017-04-09 MED ORDER — SODIUM CHLORIDE 0.9% FLUSH
3.0000 mL | Freq: Two times a day (BID) | INTRAVENOUS | Status: DC
  Administered 2017-04-09 – 2017-04-10 (×3): 3 mL via INTRAVENOUS

## 2017-04-09 MED ORDER — SODIUM CHLORIDE 0.9% FLUSH: 3.0000 mL | INTRAVENOUS | Status: DC | PRN

## 2017-04-09 MED ORDER — PROMETHAZINE HCL 25 MG/ML IJ SOLN
12.5000 mg | Freq: Once | INTRAMUSCULAR | Status: AC
  Administered 2017-04-09: 12.5 mg via INTRAVENOUS
  Filled 2017-04-09: qty 1

## 2017-04-09 MED ORDER — ROCURONIUM BROMIDE 10 MG/ML (PF) SYRINGE
PREFILLED_SYRINGE | INTRAVENOUS | Status: AC
  Filled 2017-04-09: qty 5

## 2017-04-09 MED ORDER — PHENYLEPHRINE HCL 10 MG/ML IJ SOLN: INTRAMUSCULAR | Status: DC | PRN

## 2017-04-09 MED ORDER — LOPERAMIDE HCL 2 MG PO CAPS: 2.0000 mg | ORAL_CAPSULE | ORAL | Status: DC | PRN

## 2017-04-09 MED ORDER — ASPIRIN EC 81 MG PO TBEC
81.0000 mg | DELAYED_RELEASE_TABLET | Freq: Every day | ORAL | Status: DC
  Administered 2017-04-10 – 2017-04-11 (×2): 81 mg via ORAL
  Filled 2017-04-09 (×2): qty 1

## 2017-04-09 MED ORDER — PHENYLEPHRINE 40 MCG/ML (10ML) SYRINGE FOR IV PUSH (FOR BLOOD PRESSURE SUPPORT)
PREFILLED_SYRINGE | INTRAVENOUS | Status: AC
  Filled 2017-04-09: qty 10

## 2017-04-09 SURGICAL SUPPLY — 105 items
ADAPTER UNIV SWAN GANZ BIP (ADAPTER) ×2 IMPLANT
ADAPTER UNV SWAN GANZ BIP (ADAPTER) ×1
ADH SKN CLS APL DERMABOND .7 (GAUZE/BANDAGES/DRESSINGS) ×4
ADPR CATH UNV NS SG CATH (ADAPTER) ×2
ATTRACTOMAT 16X20 MAGNETIC DRP (DRAPES) IMPLANT
BAG BANDED W/RUBBER/TAPE 36X54 (MISCELLANEOUS) ×3 IMPLANT
BAG DECANTER FOR FLEXI CONT (MISCELLANEOUS) IMPLANT
BAG EQP BAND 135X91 W/RBR TAPE (MISCELLANEOUS) ×2
BAG SNAP BAND KOVER 36X36 (MISCELLANEOUS) ×6 IMPLANT
BLADE 10 SAFETY STRL DISP (BLADE) ×3 IMPLANT
BLADE CLIPPER SURG (BLADE) IMPLANT
BLADE STERNUM SYSTEM 6 (BLADE) ×3 IMPLANT
CABLE PACING FASLOC BIEGE (MISCELLANEOUS) ×3 IMPLANT
CABLE PACING FASLOC BLUE (MISCELLANEOUS) ×3 IMPLANT
CANISTER SUCT 3000ML PPV (MISCELLANEOUS) IMPLANT
CANNULA FEM VENOUS REMOTE 22FR (CANNULA) IMPLANT
CANNULA OPTISITE PERFUSION 16F (CANNULA) IMPLANT
CANNULA OPTISITE PERFUSION 18F (CANNULA) IMPLANT
CATH DIAG EXPO 6F VENT PIG 145 (CATHETERS) ×6 IMPLANT
CATH EXPO 5FR AL1 (CATHETERS) ×3 IMPLANT
CATH S G BIP PACING (SET/KITS/TRAYS/PACK) ×6 IMPLANT
CLIP TI MEDIUM 24 (CLIP) ×3 IMPLANT
CLIP TI WIDE RED SMALL 24 (CLIP) ×3 IMPLANT
CONT SPEC 4OZ CLIKSEAL STRL BL (MISCELLANEOUS) ×6 IMPLANT
COVER BACK TABLE 60X90IN (DRAPES) ×3 IMPLANT
COVER BACK TABLE 80X110 HD (DRAPES) ×3 IMPLANT
COVER DOME SNAP 22 D (MISCELLANEOUS) ×3 IMPLANT
COVER MAYO STAND STRL (DRAPES) ×3 IMPLANT
COVER PROBE W GEL 5X96 (DRAPES) ×1 IMPLANT
CRADLE DONUT ADULT HEAD (MISCELLANEOUS) ×3 IMPLANT
DERMABOND ADVANCED (GAUZE/BANDAGES/DRESSINGS) ×2
DERMABOND ADVANCED .7 DNX12 (GAUZE/BANDAGES/DRESSINGS) ×2 IMPLANT
DEVICE CLOSURE PERCLS PRGLD 6F (VASCULAR PRODUCTS) IMPLANT
DRAPE INCISE IOBAN 66X45 STRL (DRAPES) IMPLANT
DRAPE SLUSH MACHINE 52X66 (DRAPES) ×3 IMPLANT
DRSG COVADERM 4X10 (GAUZE/BANDAGES/DRESSINGS) ×2 IMPLANT
DRSG TEGADERM 4X4.75 (GAUZE/BANDAGES/DRESSINGS) ×3 IMPLANT
ELECT REM PT RETURN 9FT ADLT (ELECTROSURGICAL) ×6
ELECTRODE REM PT RTRN 9FT ADLT (ELECTROSURGICAL) ×4 IMPLANT
FELT TEFLON 6X6 (MISCELLANEOUS) ×3 IMPLANT
FEMORAL VENOUS CANN RAP (CANNULA) IMPLANT
GAUZE SPONGE 4X4 12PLY STRL (GAUZE/BANDAGES/DRESSINGS) ×3 IMPLANT
GAUZE SPONGE 4X4 12PLY STRL LF (GAUZE/BANDAGES/DRESSINGS) ×2 IMPLANT
GLOVE BIO SURGEON STRL SZ7.5 (GLOVE) ×3 IMPLANT
GLOVE BIO SURGEON STRL SZ8 (GLOVE) ×6 IMPLANT
GLOVE EUDERMIC 7 POWDERFREE (GLOVE) ×3 IMPLANT
GLOVE ORTHO TXT STRL SZ7.5 (GLOVE) ×3 IMPLANT
GOWN STRL REUS W/ TWL LRG LVL3 (GOWN DISPOSABLE) ×6 IMPLANT
GOWN STRL REUS W/ TWL XL LVL3 (GOWN DISPOSABLE) ×12 IMPLANT
GOWN STRL REUS W/TWL LRG LVL3 (GOWN DISPOSABLE) ×9
GOWN STRL REUS W/TWL XL LVL3 (GOWN DISPOSABLE) ×18
GUIDEWIRE SAF TJ AMPL .035X180 (WIRE) ×3 IMPLANT
GUIDEWIRE SAFE TJ AMPLATZ EXST (WIRE) ×3 IMPLANT
GUIDEWIRE STRAIGHT .035 260CM (WIRE) ×3 IMPLANT
INSERT FOGARTY 61MM (MISCELLANEOUS) ×3 IMPLANT
INSERT FOGARTY SM (MISCELLANEOUS) IMPLANT
INSERT FOGARTY XLG (MISCELLANEOUS) IMPLANT
KIT BASIN OR (CUSTOM PROCEDURE TRAY) ×3 IMPLANT
KIT DILATOR VASC 18G NDL (KITS) IMPLANT
KIT HEART LEFT (KITS) ×3 IMPLANT
KIT ROOM TURNOVER OR (KITS) ×3 IMPLANT
KIT SUCTION CATH 14FR (SUCTIONS) ×6 IMPLANT
NDL PERC 18GX7CM (NEEDLE) ×2 IMPLANT
NEEDLE PERC 18GX7CM (NEEDLE) ×3 IMPLANT
NS IRRIG 1000ML POUR BTL (IV SOLUTION) ×9 IMPLANT
PACK AORTA (CUSTOM PROCEDURE TRAY) ×3 IMPLANT
PAD ARMBOARD 7.5X6 YLW CONV (MISCELLANEOUS) ×6 IMPLANT
PAD ELECT DEFIB RADIOL ZOLL (MISCELLANEOUS) ×3 IMPLANT
PATCH TACHOSII LRG 9.5X4.8 (VASCULAR PRODUCTS) IMPLANT
PERCLOSE PROGLIDE 6F (VASCULAR PRODUCTS) ×6
SET MICROPUNCTURE 5F STIFF (MISCELLANEOUS) ×3 IMPLANT
SHEATH AVANTI 11CM 8FR (MISCELLANEOUS) ×3 IMPLANT
SHEATH PINNACLE 6F 10CM (SHEATH) ×6 IMPLANT
SLEEVE REPOSITIONING LENGTH 30 (MISCELLANEOUS) ×3 IMPLANT
SPONGE LAP 4X18 X RAY DECT (DISPOSABLE) ×3 IMPLANT
STOPCOCK MORSE 400PSI 3WAY (MISCELLANEOUS) ×18 IMPLANT
SUT ETHIBOND X763 2 0 SH 1 (SUTURE) IMPLANT
SUT GORETEX CV 4 TH 22 36 (SUTURE) IMPLANT
SUT GORETEX CV4 TH-18 (SUTURE) IMPLANT
SUT GORETEX TH-18 36 INCH (SUTURE) IMPLANT
SUT MNCRL AB 3-0 PS2 18 (SUTURE) IMPLANT
SUT PROLENE 3 0 SH1 36 (SUTURE) IMPLANT
SUT PROLENE 4 0 RB 1 (SUTURE)
SUT PROLENE 4-0 RB1 .5 CRCL 36 (SUTURE) IMPLANT
SUT PROLENE 5 0 C 1 36 (SUTURE) IMPLANT
SUT PROLENE 6 0 C 1 30 (SUTURE) IMPLANT
SUT SILK  1 MH (SUTURE) ×1
SUT SILK 1 MH (SUTURE) ×2 IMPLANT
SUT SILK 2 0 SH CR/8 (SUTURE) IMPLANT
SUT VIC AB 2-0 CT1 27 (SUTURE)
SUT VIC AB 2-0 CT1 TAPERPNT 27 (SUTURE) IMPLANT
SUT VIC AB 2-0 CTX 36 (SUTURE) IMPLANT
SUT VIC AB 3-0 SH 8-18 (SUTURE) IMPLANT
SYR 10ML LL (SYRINGE) ×9 IMPLANT
SYR 30ML LL (SYRINGE) ×6 IMPLANT
SYR 50ML LL SCALE MARK (SYRINGE) ×3 IMPLANT
TOWEL OR 17X26 10 PK STRL BLUE (TOWEL DISPOSABLE) ×6 IMPLANT
TRANSCATH VALVE HEART SZ3 20 (Valve) ×3 IMPLANT
TRANSDUCER W/STOPCOCK (MISCELLANEOUS) ×6 IMPLANT
TRAY FOLEY SILVER 14FR TEMP (SET/KITS/TRAYS/PACK) ×3 IMPLANT
TUBE SUCT INTRACARD DLP 20F (MISCELLANEOUS) IMPLANT
TUBING HIGH PRESSURE 120CM (CONNECTOR) ×3 IMPLANT
VALVE TRANSCATH HEART SZ3 20 (Valve) IMPLANT
WIRE AMPLATZ SS-J .035X180CM (WIRE) ×3 IMPLANT
WIRE BENTSON .035X145CM (WIRE) ×3 IMPLANT

## 2017-04-09 NOTE — Op Note (Signed)
[facility]   MULTIDISCIPLINARY HEART VALVE TEAM   TAVR OPERATIVE NOTE   Date of Procedure:  04/09/2017  Preoperative Diagnosis: Severe Aortic Stenosis   Postoperative Diagnosis: Same   Procedure:    Transcatheter Aortic Valve Replacement - Percutaneous Transfemoral Approach  Edwards Sapien 3 THV (size 20 mm, model # 9600TFX, serial # [id number])   Co-Surgeons:  Gilford Raid, MD and Sherren Mocha, MD  Anesthesiologist:  Roberts Gaudy, MD  Echocardiographer:  Ena Dawley, MD  Pre-operative Echo Findings:  Severe aortic stenosis  Normal left ventricular systolic function   Post-operative Echo Findings:  Trace paravalvular leak  Normal left ventricular systolic function  BRIEF CLINICAL NOTE AND INDICATIONS FOR SURGERY  83 yo woman with diabetes and COPD who has developed severe symptomatic aortic stenosis. After undergoing cardiac cath and PCI of the ostial RCA, as well as CT studies and cardiac surgical evaluation, the patient is felt to treated best by TAVR. She presents today for TAVR for treatment of severe, Stage D, aortic stenosis.   During the course of the patient's preoperative work up they have been evaluated comprehensively by a multidisciplinary team of specialists coordinated through the [facility] in the [facility].  They have been demonstrated to suffer from symptomatic severe aortic stenosis as noted above. The patient has been counseled extensively as to the relative risks and benefits of all options for the treatment of severe aortic stenosis including long term medical therapy, conventional surgery for aortic valve replacement, and transcatheter aortic valve replacement.  The patient has been independently evaluated by two cardiac surgeons including Dr. Roxy Manns and Dr. Cyndia Bent, and they are felt to be at high risk for conventional surgical aortic valve replacement.  Based upon review of all of  the patient's preoperative diagnostic tests they are felt to be candidate for transcatheter aortic valve replacement using the transfemoral approach as an alternative to high risk conventional surgery.    Following the decision to proceed with transcatheter aortic valve replacement, a discussion has been held regarding what types of management strategies would be attempted intraoperatively in the event of life-threatening complications, including whether or not the patient would be considered a candidate for the use of cardiopulmonary bypass and/or conversion to open sternotomy for attempted surgical intervention.  The patient has been advised of a variety of complications that might develop peculiar to this approach including but not limited to risks of death, stroke, paravalvular leak, aortic dissection or other major vascular complications, aortic annulus rupture, device embolization, cardiac rupture or perforation, acute myocardial infarction, arrhythmia, heart block or bradycardia requiring permanent pacemaker placement, congestive heart failure, respiratory failure, renal failure, pneumonia, infection, other late complications related to structural valve deterioration or migration, or other complications that might ultimately cause a temporary or permanent loss of functional independence or other long term morbidity.  The patient provides full informed consent for the procedure as described and all questions were answered preoperatively.  DETAILS OF THE OPERATIVE PROCEDURE  PREPARATION:   The patient is brought to the operating room on the above mentioned date and central monitoring was established by the anesthesia team including placement of Swan-Ganz catheter and radial arterial line. The patient is placed in the supine position on the operating table.  Intravenous antibiotics are administered. General endotracheal anesthesia is induced uneventfully.  A Foley catheter is placed.  Baseline  transesophageal echocardiogram was performed. The patient's chest, abdomen, both groins, and both lower extremities are prepared and  draped in a sterile manner. A time out procedure is performed.   PERIPHERAL ACCESS:   Using the modified Seldinger technique, femoral arterial and venous access was obtained with placement of 6 Fr sheaths on the left side using ultrasound guidance.  A pigtail diagnostic catheter was passed through the left femoral arterial sheath under fluoroscopic guidance into the aortic root.  A temporary transvenous pacemaker catheter was passed through the left femoral venous sheath under fluoroscopic guidance into the right ventricle.  The pacemaker was tested to ensure stable lead placement and pacemaker capture. Aortic root angiography was performed in order to determine the optimal angiographic angle for valve deployment.   TRANSFEMORAL ACCESS:  A micropuncture technique is used to access the right femoral artery under fluoroscopic and ultrasound guidance.  Femoral angiography is performed to verify access in the common femoral artery. 2 Perclose devices are deployed at 10' and 2' positions to 'PreClose' the femoral artery. An 8 French sheath is placed and then an Amplatz Superstiff wire is advanced through the sheath. This is changed out for a 14 French transfemoral E-Sheath after progressively dilating over the Superstiff wire.  An AL-1 catheter was used to direct a straight-tip exchange length wire across the native aortic valve into the left ventricle. This was exchanged out for a pigtail catheter and position was confirmed in the LV apex. Simultaneous LV and Ao pressures were recorded.  The pigtail catheter was exchanged for an Amplatz Extra-stiff wire in the LV apex.  Echocardiography was utilized to confirm appropriate wire position and no sign of entanglement in the mitral subvalvular apparatus.  TRANSCATHETER HEART VALVE DEPLOYMENT:  An Edwards Sapien 3 transcatheter heart  valve (size 20 mm, model #9600TFX, serial #[id number]) was prepared and crimped per manufacturer's guidelines, and the proper orientation of the valve is confirmed on the [employer] delivery system. The valve was advanced through the introducer sheath using normal technique until in an appropriate position in the abdominal aorta beyond the sheath tip. The balloon was then retracted and using the fine-tuning wheel was centered on the valve. The valve was then advanced across the aortic arch using appropriate flexion of the catheter. The valve was carefully positioned across the aortic valve annulus. The Commander catheter was retracted using normal technique. Once final position of the valve has been confirmed by angiographic assessment, the valve is deployed while temporarily holding ventilation and during rapid ventricular pacing to maintain systolic blood pressure < 50 mmHg and pulse pressure < 10 mmHg. The balloon inflation is held for >3 seconds after reaching full deployment volume. Once the balloon has fully deflated the balloon is retracted into the ascending aorta and valve function is assessed using echocardiography. There is felt to be trace paravalvular leak and no central aortic insufficiency.  The patient's hemodynamic recovery following valve deployment is good.  The deployment balloon and guidewire are both removed. Echo demostrated acceptable post-procedural gradients, stable mitral valve function, and trace aortic insufficiency.    PROCEDURE COMPLETION:  The sheath was removed and femoral artery closure is performed using the 2 previously deployed Perclose devices.   There is complete hemostasis after the Perclose sutures are tightened.  Protamine was administered once femoral arterial repair was complete. The temporary pacemaker, pigtail catheters and femoral sheaths were removed with manual pressure used for hemostasis.   The patient tolerated the procedure well and is transported to the  surgical intensive care in stable condition. There were no immediate intraoperative complications. All sponge instrument and needle counts  are verified correct at completion of the operation.   The patient received a total of 55 mL of intravenous contrast during the procedure.   Sherren Mocha, MD 04/09/2017 10:31 AM

## 2017-04-09 NOTE — Anesthesia Procedure Notes (Signed)
Procedure Name: Intubation Date/Time: 04/09/2017 7:52 AM Performed by: Rebekah [place] L Pre-anesthesia Checklist: Patient identified, Emergency Drugs available, Suction available and Patient being monitored Patient Re-evaluated:Patient Re-evaluated prior to inductionOxygen Delivery Method: Circle System Utilized Preoxygenation: Pre-oxygenation with 100% oxygen Intubation Type: IV induction Ventilation: Mask ventilation without difficulty Laryngoscope Size: Mac and 3 Grade View: Grade I Tube type: Oral Tube size: 7.5 mm Number of attempts: 1 Airway Equipment and Method: Stylet and Oral airway Placement Confirmation: ETT inserted through vocal cords under direct vision,  positive ETCO2 and breath sounds checked- equal and bilateral Secured at: 21 cm Tube secured with: Tape Dental Injury: Teeth and Oropharynx as per pre-operative assessment

## 2017-04-09 NOTE — OR Nursing (Signed)
SICU first call 50min @0903 . Second call 39min@ 0915

## 2017-04-09 NOTE — Care Management Note (Signed)
Case Management Note Marvetta Gibbons RN, BSN Unit 2W-Case Manager-- [place] coverage [phone]  Patient Details  Name: Amy Kaiser MRN: [id number] Date of Birth: January 25, 1929  Subjective/Objective:   Pt s/p TAVR on 04/09/17                 Action/Plan: PTA pt lived at home alone, independent- CM to follow for d/c needs - pt has supportive family (son and daughter)  Expected Discharge Date:                  Expected Discharge Plan:     In-House Referral:     Discharge planning Services  CM Consult  Post Acute Care Choice:    Choice offered to:     DME Arranged:    DME Agency:     HH Arranged:    [place] Agency:     Status of Service:  In process, will continue to follow  If discussed at Long Length of Stay Meetings, dates discussed:    Discharge Disposition:   Additional Comments:  Dawayne Patricia, RN 04/09/2017, 11:35 AM

## 2017-04-09 NOTE — Anesthesia Preprocedure Evaluation (Addendum)
Anesthesia Evaluation  Patient identified by MRN, date of birth, ID band Patient awake    Reviewed: Allergy & Precautions, NPO status , Patient's Chart, lab work & pertinent test results  Airway Mallampati: II  TM Distance: >3 FB Neck ROM: Full    Dental  (+) Edentulous Upper, Partial Lower   Pulmonary    breath sounds clear to auscultation       Cardiovascular  Rhythm:Regular Rate:Normal + Systolic murmurs    Neuro/Psych    GI/Hepatic   Endo/Other  diabetes  Renal/GU      Musculoskeletal   Abdominal   Peds  Hematology   Anesthesia Other Findings   Reproductive/Obstetrics                            Anesthesia Physical Anesthesia Plan  ASA: III  Anesthesia Plan: General   Post-op Pain Management:    Induction:   PONV Risk Score and Plan: 1 and Ondansetron and Treatment may vary due to age  Airway Management Planned: Oral ETT  Additional Equipment: CVP, Arterial line and 3D TEE  Intra-op Plan:   Post-operative Plan: Extubation in OR  Informed Consent: I have reviewed the patients History and Physical, chart, labs and discussed the procedure including the risks, benefits and alternatives for the proposed anesthesia with the patient or authorized representative who has indicated his/her understanding and acceptance.   Dental advisory given  Plan Discussed with: CRNA and Anesthesiologist  Anesthesia Plan Comments:        Anesthesia Quick Evaluation

## 2017-04-09 NOTE — Progress Notes (Signed)
Patient ID: Amy Kaiser, female   DOB: [birth year], 83 y.o.   MRN: [id number]   SICU Evening Rounds:   Hemodynamically stable in sinus rhythm 86  Awake, alert, neuro intact  No murmur  Groin access sites look good.    CBC    Component Value Date/Time   WBC 6.7 04/09/2017 1620   RBC 2.73 (L) 04/09/2017 1620   HGB 8.2 (L) 04/09/2017 1631   HCT 24.0 (L) 04/09/2017 1631   HCT 35.8 02/15/2017 1404   PLT 145 (L) 04/09/2017 1620   PLT 276 02/15/2017 1404   MCV 96.7 04/09/2017 1620   MCV 95 02/15/2017 1404   MCH 30.4 04/09/2017 1620   MCHC 31.4 04/09/2017 1620   RDW 14.1 04/09/2017 1620   RDW 13.5 02/15/2017 1404   LYMPHSABS 1.5 04/27/2016 0805   MONOABS 1.4 (H) 04/27/2016 0805   EOSABS 0.0 04/27/2016 0805   BASOSABS 0.0 04/27/2016 0805     BMET    Component Value Date/Time   NA 144 04/09/2017 1631   NA 143 02/15/2017 1404   K 4.3 04/09/2017 1631   CL 105 04/09/2017 1631   CO2 22 04/05/2017 1324   GLUCOSE 114 (H) 04/09/2017 1631   BUN 14 04/09/2017 1631   BUN 13 02/15/2017 1404   CREATININE 0.80 04/09/2017 1631   CALCIUM 8.5 (L) 04/05/2017 1324   GFRNONAA 56 (L) 04/05/2017 1324   GFRAA >60 04/05/2017 1324     A/P:  Stable postop course. Continue current plans.

## 2017-04-09 NOTE — Anesthesia Procedure Notes (Signed)
Central Venous Catheter Insertion Performed by: Roberts Gaudy, anesthesiologist Start/End6/03/2017 7:00 AM, 04/09/2017 7:10 AM Patient location: Pre-op. Preanesthetic checklist: patient identified, IV checked, site marked, risks and benefits discussed, surgical consent, monitors and equipment checked, pre-op evaluation, timeout performed and anesthesia consent Position: supine Lidocaine 1% used for infiltration and patient sedated Hand hygiene performed  and maximum sterile barriers used  Catheter size: 8 Fr Total catheter length 16. Central line was placed.Double lumen Procedure performed using ultrasound guided technique. Ultrasound Notes:image(s) printed for medical record Attempts: 1 Following insertion, dressing applied and line sutured. Post procedure assessment: blood return through all ports  Patient tolerated the procedure well with no immediate complications.

## 2017-04-09 NOTE — Interval H&P Note (Signed)
History and Physical Interval Note:  04/09/2017 6:34 AM  Amy Kaiser  has presented today for surgery, with the diagnosis of SEVERE AS  The various methods of treatment have been discussed with the patient and family. After consideration of risks, benefits and other options for treatment, the patient has consented to  Procedure(s): TRANSCATHETER AORTIC VALVE REPLACEMENT, TRANSFEMORAL (N/A) TRANSESOPHAGEAL ECHOCARDIOGRAM (TEE) (N/A) as a surgical intervention .  The patient's history has been reviewed, patient examined, no change in status, stable for surgery.  I have reviewed the patient's chart and labs.  Questions were answered to the patient's satisfaction.     Gaye Pollack

## 2017-04-09 NOTE — Transfer of Care (Signed)
Immediate Anesthesia Transfer of Care Note  Patient: Amy Kaiser  Procedure(s) Performed: Procedure(s): TRANSCATHETER AORTIC VALVE REPLACEMENT, TRANSFEMORAL (N/A) TRANSESOPHAGEAL ECHOCARDIOGRAM (TEE) (N/A)  Patient Location: PACU  Anesthesia Type:General  Level of Consciousness: awake, alert , oriented and patient cooperative  Airway & Oxygen Therapy: Patient Spontanous Breathing and Patient connected to nasal cannula oxygen  Post-op Assessment: Report given to RN, Post -op Vital signs reviewed and stable and Patient moving all extremities  Post vital signs: Reviewed and stable  Last Vitals:  Vitals:   04/09/17 0936 04/09/17 0941  BP:    Pulse: (!) 138 (!) 0  Resp:    Temp:      Last Pain: There were no vitals filed for this visit.       Complications: No apparent anesthesia complications

## 2017-04-09 NOTE — Progress Notes (Signed)
Paged Dr Burt Knack concerning pts BP 180s/50s via [place]. Pt on Nitro gtt at 141mcg/hr and given 5mg  Lopressor Q2. RN will continue to monitor.

## 2017-04-09 NOTE — Op Note (Signed)
[facility]   MULTIDISCIPLINARY HEART VALVE TEAM   TAVR OPERATIVE NOTE   Date of Procedure:  04/09/2017  Preoperative Diagnosis: Severe Aortic Stenosis   Postoperative Diagnosis: Same   Procedure:    Transcatheter Aortic Valve Replacement - Percutaneous Right Transfemoral Approach  Edwards Sapien 3 THV (size 20 mm, model # 9600TFX, serial # [id number])   Co-Surgeons: Gaye Pollack, MD and Sherren Mocha, MD    Anesthesiologist:  Roberts Gaudy, MD  Echocardiographer:  Ena Dawley, MD  Pre-operative Echo Findings:  Severe aortic stenosis  Normal left ventricular systolic function       Post-operative Echo Findings:  Trivial paravalvular leak   Normal left ventricular systolic function   BRIEF CLINICAL NOTE AND INDICATIONS FOR SURGERY  The patient is an 83 year old woman with diabetes, hyperlipidemia, asthma and COPD who was evaluated about a year ago by Dr. Wynonia Lawman for a heart murmur. Echo at that time showed moderate AS. She reports a history of asthma for at least 25 years and it has been mild requiring occasional inhaler use. Over the past 6 months she has developed progressive exertional shortness of breath and chest tightness resolved with rest. It usually occurs with walking up hills. She has not had dizziness or syncope. She has been using her inhaler frequently. She had an echo repeated recently that showed progression of her AS with a mean gradient of 43 mm Hg. LVEF was 65%.   She has stage D, severe, symptomatic aortic stenosis with progressive exertional shortness of breath and fatigue consistent with NYHA class III chronic diastolic heart failure. She also had some chest discomfort and dizziness at times. Her cath showed some stenosis of the RCA ostium that worsened with further engagement of the ostium and it was unclear if it worsened from spasm or that the degree of stenosis was worse than it appeared initially. Her gated cardiac CT with FFR of  the RCA indicated that it may be more significant than initially felt with an FFR of 0.65. There are no other significant coronary stenoses. She underwent PCI with a DES to the RCA on 03/11/2017. I have personally reviewed her echo, cath, and CT studies. She has severe AS with a trileaflet valve with mild calcified, thickened leaflets with restricted mobility. I think AVR is indicated in this patient for relief of symptoms and improvement in quality of life as well as to prevent progressive LV dysfunction. She is at least intermediate risk for open surgical AVR and CABG with an STS PROM of 6.2%. I think TAVR would be a reasonable alternative for her. The heart valve team discussed that and agreed.  Her cardiac CT shows favorable anatomy for a Sapien 3 valve and her abdominal and pelvic CT shows anatomy suitable for transfemoral approach.   The patient and her family werecounseled at length regarding treatment alternatives for management of severe symptomatic aortic stenosis. The risks and benefits of surgical intervention has been discussed in detail. Long-term prognosis with medical therapy was discussed. Alternative approaches such as conventional surgical aortic valve replacement, transcatheter aortic valve replacement, and palliative medical therapy were compared and contrasted at length. This discussion was placed in the context of the patient's own specific clinical presentation and past medical history. All of their questions been addressed. The patient is eager to proceed with surgical management as soon as possible.  The patient has been advised of a variety of complications that might develop including but not limited to risks of death,  stroke, paravalvular leak, aortic dissection or other major vascular complications, aortic annulus rupture, device embolization, cardiac rupture or perforation, mitral regurgitation, acute myocardial infarction, arrhythmia, heart block or bradycardia requiring  permanent pacemaker placement, congestive heart failure, respiratory failure, renal failure, pneumonia, infection, other late complications related to structural valve deterioration or migration, or other complications that might ultimately cause a temporary or permanent loss of functional independence or other long term morbidity. The patient provides full informed consent for the TAVR procedure as described if we decide to proceed in that direction and all questions were answered.     DETAILS OF THE OPERATIVE PROCEDURE  PREPARATION:    The patient is brought to the operating room on the above mentioned date and central monitoring was established by the anesthesia team including placement of a central venous line and radial arterial line. The patient is placed in the supine position on the operating table.  Intravenous antibiotics are administered.  General endotracheal anesthesia is induced uneventfully.  A Foley catheter is placed.  Baseline transesophageal echocardiogram was performed. The patient's chest, abdomen, both groins, and both lower extremities are prepared and draped in a sterile manner. A time out procedure is performed.   PERIPHERAL ACCESS:    Using the modified Seldinger technique, femoral arterial and venous access was obtained with placement of 6 Fr sheaths on the left side.  A pigtail diagnostic catheter was passed through the left arterial sheath under fluoroscopic guidance into the aortic root.  A temporary transvenous pacemaker catheter was passed through the left femoral venous sheath under fluoroscopic guidance into the right ventricle.  The pacemaker was tested to ensure stable lead placement and pacemaker capture. Aortic root angiography was performed in order to determine the optimal angiographic angle for valve deployment.   TRANSFEMORAL ACCESS:   Percutaneous transfemoral access and sheath placement was performed by Dr Burt Knack. Please see his separate operative  note for details. The patient was heparinized systemically and ACT verified > 250 seconds.    A 14 Fr transfemoral E-sheath was introduced into the right femoral artery after progressively dilating over an Amplatz superstiff wire. An AL-1 catheter was used to direct a straight-tip exchange length wire across the native aortic valve into the left ventricle. This was exchanged out for a pigtail catheter and position was confirmed in the LV apex. Simultaneous LV and Ao pressures were recorded.  The pigtail catheter was exchanged for an Amplatz Extra-stiff wire in the LV apex.  Echocardiography was utilized to confirm appropriate wire position and no sign of entanglement in the mitral subvalvular apparatus.   BALLOON AORTIC VALVULOPLASTY:   Not performed  TRANSCATHETER HEART VALVE DEPLOYMENT:   An Edwards Sapien 3 transcatheter heart valve (size 20 mm, model #9600TFX, serial #[id number]) was prepared and crimped per manufacturer's guidelines, and the proper orientation of the valve is confirmed on the [employer] delivery system. The valve was advanced through the introducer sheath using normal technique until in an appropriate position in the abdominal aorta beyond the sheath tip. The balloon was then retracted and using the fine-tuning wheel was centered on the valve. The valve was then advanced across the aortic arch using appropriate flexion of the catheter. The valve was carefully positioned across the aortic valve annulus. The Commander catheter was retracted using normal technique. Once final position of the valve has been confirmed by angiographic assessment, the valve is deployed while temporarily holding ventilation and during rapid ventricular pacing to maintain systolic blood pressure < 50 mmHg and pulse  pressure < 10 mmHg. The balloon inflation is held for >3 seconds after reaching full deployment volume. Once the balloon has fully deflated the balloon is retracted into the ascending aorta and  valve function is assessed using echocardiography. There is felt to be trivial paravalvular leak and no central aortic insufficiency.  The patient's hemodynamic recovery following valve deployment is good.  The deployment balloon and guidewire are both removed. Final intraoperative TEE demostrated acceptable post-procedural gradients, stable mitral valve function, trivial aortic insufficiency, and stable LV systolic function.   PROCEDURE COMPLETION:   The sheath was removed and femoral artery closure performed by Dr Burt Knack. Please see his separate report for details.  Protamine was administered once femoral artery closure was complete. The temporary pacemaker, pigtail catheters and femoral sheaths were removed with manual pressure used for hemostasis.   The patient tolerated the procedure well and is transported to the surgical intensive care in stable condition. There were no immediate intraoperative complications. All sponge instrument and needle counts are verified correct at completion of the operation.   No blood products were administered during the operation.  The patient received a total of 55 mL of intravenous contrast during the procedure.   Gaye Pollack, MD 04/09/2017

## 2017-04-09 NOTE — Anesthesia Postprocedure Evaluation (Signed)
Anesthesia Post Note  Patient: Amy Kaiser  Procedure(s) Performed: Procedure(s) (LRB): TRANSCATHETER AORTIC VALVE REPLACEMENT, TRANSFEMORAL (N/A) TRANSESOPHAGEAL ECHOCARDIOGRAM (TEE) (N/A)     Patient location during evaluation: SICU Anesthesia Type: General Level of consciousness: awake, awake and alert and oriented Pain management: pain level controlled Vital Signs Assessment: post-procedure vital signs reviewed and stable Respiratory status: spontaneous breathing, nonlabored ventilation, respiratory function stable and patient connected to nasal cannula oxygen Cardiovascular status: blood pressure returned to baseline Anesthetic complications: no    Last Vitals:  Vitals:   04/09/17 1621 04/09/17 1700  BP:  (!) 108/50  Pulse:  74  Resp:  20  Temp: 36.8 C     Last Pain:  Vitals:   04/09/17 1621  TempSrc: Oral                 Zarius Furr COKER

## 2017-04-10 ENCOUNTER — Encounter (HOSPITAL_COMMUNITY): Payer: Self-pay | Admitting: Cardiovascular Disease

## 2017-04-10 ENCOUNTER — Inpatient Hospital Stay (HOSPITAL_COMMUNITY): Payer: Medicare Other

## 2017-04-10 DIAGNOSIS — I350 Nonrheumatic aortic (valve) stenosis: Secondary | ICD-10-CM

## 2017-04-10 DIAGNOSIS — I340 Nonrheumatic mitral (valve) insufficiency: Secondary | ICD-10-CM

## 2017-04-10 DIAGNOSIS — Z954 Presence of other heart-valve replacement: Secondary | ICD-10-CM

## 2017-04-10 LAB — CBC
HCT: 26.6 % — ABNORMAL LOW (ref 36.0–46.0)
HEMOGLOBIN: 8.4 g/dL — AB (ref 12.0–15.0)
MCH: 30.8 pg (ref 26.0–34.0)
MCHC: 31.6 g/dL (ref 30.0–36.0)
MCV: 97.4 fL (ref 78.0–100.0)
PLATELETS: 140 10*3/uL — AB (ref 150–400)
RBC: 2.73 MIL/uL — ABNORMAL LOW (ref 3.87–5.11)
RDW: 14.3 % (ref 11.5–15.5)
WBC: 8.3 10*3/uL (ref 4.0–10.5)

## 2017-04-10 LAB — BASIC METABOLIC PANEL
Anion gap: 8 (ref 5–15)
BUN: 10 mg/dL (ref 6–20)
CALCIUM: 8.2 mg/dL — AB (ref 8.9–10.3)
CO2: 26 mmol/L (ref 22–32)
CREATININE: 0.85 mg/dL (ref 0.44–1.00)
Chloride: 105 mmol/L (ref 101–111)
Glucose, Bld: 109 mg/dL — ABNORMAL HIGH (ref 65–99)
Potassium: 3.9 mmol/L (ref 3.5–5.1)
SODIUM: 139 mmol/L (ref 135–145)

## 2017-04-10 LAB — GLUCOSE, CAPILLARY
GLUCOSE-CAPILLARY: 99 mg/dL (ref 65–99)
Glucose-Capillary: 109 mg/dL — ABNORMAL HIGH (ref 65–99)

## 2017-04-10 MED ORDER — SODIUM CHLORIDE 0.9% FLUSH
3.0000 mL | Freq: Two times a day (BID) | INTRAVENOUS | Status: DC
  Administered 2017-04-10: 3 mL via INTRAVENOUS

## 2017-04-10 MED ORDER — FERROUS GLUCONATE 324 (38 FE) MG PO TABS
324.0000 mg | ORAL_TABLET | Freq: Two times a day (BID) | ORAL | Status: DC
Start: 2017-04-10 — End: 2017-04-11
  Administered 2017-04-10 – 2017-04-11 (×2): 324 mg via ORAL
  Filled 2017-04-10 (×2): qty 1

## 2017-04-10 MED ORDER — SODIUM CHLORIDE 0.9% FLUSH: 10.0000 mL | Freq: Two times a day (BID) | INTRAVENOUS | Status: DC

## 2017-04-10 MED ORDER — SODIUM CHLORIDE 0.9 % IV SOLN: 250.0000 mL | INTRAVENOUS | Status: DC | PRN

## 2017-04-10 MED ORDER — ONDANSETRON HCL 4 MG PO TABS: 4.0000 mg | ORAL_TABLET | Freq: Four times a day (QID) | ORAL | Status: DC | PRN

## 2017-04-10 MED ORDER — SODIUM CHLORIDE 0.9% FLUSH: 3.0000 mL | INTRAVENOUS | Status: DC | PRN

## 2017-04-10 MED ORDER — ACETAMINOPHEN 325 MG PO TABS: 650.0000 mg | ORAL_TABLET | Freq: Four times a day (QID) | ORAL | Status: DC | PRN

## 2017-04-10 MED ORDER — PERFLUTREN LIPID MICROSPHERE
INTRAVENOUS | Status: AC
  Administered 2017-04-10: 5 mL via INTRAVENOUS
  Filled 2017-04-10: qty 10

## 2017-04-10 MED ORDER — PERFLUTREN LIPID MICROSPHERE
1.0000 mL | INTRAVENOUS | Status: AC | PRN
  Administered 2017-04-10: 5 mL via INTRAVENOUS
  Filled 2017-04-10: qty 10

## 2017-04-10 MED ORDER — ONDANSETRON HCL 4 MG/2ML IJ SOLN: 4.0000 mg | Freq: Four times a day (QID) | INTRAMUSCULAR | Status: DC | PRN

## 2017-04-10 MED ORDER — FAMOTIDINE 20 MG PO TABS
20.0000 mg | ORAL_TABLET | Freq: Two times a day (BID) | ORAL | Status: DC
  Administered 2017-04-10 – 2017-04-11 (×2): 20 mg via ORAL
  Filled 2017-04-10 (×2): qty 1

## 2017-04-10 MED ORDER — METOPROLOL TARTRATE 12.5 MG HALF TABLET
12.5000 mg | ORAL_TABLET | Freq: Two times a day (BID) | ORAL | Status: DC
  Administered 2017-04-10 – 2017-04-11 (×3): 12.5 mg via ORAL
  Filled 2017-04-10 (×3): qty 1

## 2017-04-10 MED ORDER — SODIUM CHLORIDE 0.9% FLUSH: 10.0000 mL | INTRAVENOUS | Status: DC | PRN

## 2017-04-10 MED FILL — Sodium Chloride IV Soln 0.9%: INTRAVENOUS | Qty: 100 | Status: AC

## 2017-04-10 MED FILL — Insulin Regular (Human) Inj 100 Unit/ML: INTRAMUSCULAR | Qty: 1 | Status: AC

## 2017-04-10 MED FILL — Potassium Chloride Inj 2 mEq/ML: INTRAVENOUS | Qty: 40 | Status: AC

## 2017-04-10 MED FILL — Magnesium Sulfate Inj 50%: INTRAMUSCULAR | Qty: 10 | Status: AC

## 2017-04-10 MED FILL — Heparin Sodium (Porcine) Inj 1000 Unit/ML: INTRAMUSCULAR | Qty: 30 | Status: AC

## 2017-04-10 NOTE — Progress Notes (Signed)
Assumed care from off going RN @ 1524 from [facility] Roch; patient arrived on floor via wheelchair; patient assisted to chair with no signs of acute distress; patient vitals WNL; NSR on tele; family @ bedside; call bell in reach

## 2017-04-10 NOTE — Plan of Care (Signed)
Problem: Activity: Goal: Risk for activity intolerance will decrease Outcome: Progressing Times one assist. Denies pain medication.Will continue to monitor.

## 2017-04-10 NOTE — Progress Notes (Signed)
Nutrition Brief Note  Patient identified on the Malnutrition Screening Tool (MST) Report.  Wt Readings from Last 15 Encounters:  04/10/17 183 lb 3.2 oz (83.1 kg)  04/08/17 164 lb (74.4 kg)  04/05/17 181 lb 1.6 oz (82.1 kg)  03/12/17 182 lb 15.7 oz (83 kg)  02/27/17 164 lb (74.4 kg)  02/26/17 182 lb (82.6 kg)  02/20/17 182 lb (82.6 kg)  02/15/17 184 lb 1.9 oz (83.5 kg)   Body mass index is 29.57 kg/m. Patient meets criteria for Overweight based on current BMI.   Current diet order is Heart Healthy/Carbohydrate Modified.  Reports a good appetite.    Ensure Enlive supplement order in place (BID). Labs and medications reviewed.   No nutrition interventions warranted at this time. If nutrition issues arise, please consult RD.   Arthur Holms, RD, LDN Pager #: [phone] After-Hours Pager #: [phone]

## 2017-04-10 NOTE — Progress Notes (Signed)
1 Day Post-Op Procedure(s) (LRB): TRANSCATHETER AORTIC VALVE REPLACEMENT, TRANSFEMORAL (N/A) TRANSESOPHAGEAL ECHOCARDIOGRAM (TEE) (N/A) Subjective: No complaints. Ambulated without difficulty  Objective: Vital signs in last 24 hours: Temp:  [95.6 F (35.3 C)-99.4 F (37.4 C)] 98.6 F (37 C) (06/06 0729) Pulse Rate:  [0-268] 82 (06/06 0700) Cardiac Rhythm: Normal sinus rhythm (06/06 0400) Resp:  [16-25] 24 (06/06 0700) BP: (108-148)/(38-69) 116/38 (06/06 0700) SpO2:  [91 %-100 %] 94 % (06/06 0700) Arterial Line BP: (115-182)/(36-64) 182/51 (06/05 1700) Weight:  [83.1 kg (183 lb 3.2 oz)] 83.1 kg (183 lb 3.2 oz) (06/06 0500)  Hemodynamic parameters for last 24 hours:    Intake/Output from previous day: 06/05 0701 - 06/06 0700 In: 2473.5 [P.O.:150; I.V.:1623.5; IV Piggyback:700] Out: 5974 [Urine:1715] Intake/Output this shift: No intake/output data recorded.  General appearance: alert and cooperative Neurologic: intact Heart: regular rate and rhythm, S1, S2 normal, no murmur, click, rub or gallop Lungs: clear to auscultation bilaterally Extremities: extremities normal, atraumatic, no cyanosis or edema Wound: groin access sites look good. minimal soreness on right  Lab Results:  Recent Labs  04/09/17 1620 04/09/17 1631 04/10/17 0338  WBC 6.7  --  8.3  HGB 8.3* 8.2* 8.4*  HCT 26.4* 24.0* 26.6*  PLT 145*  --  140*   BMET:  Recent Labs  04/09/17 1631 04/10/17 0338  NA 144 139  K 4.3 3.9  CL 105 105  CO2  --  26  GLUCOSE 114* 109*  BUN 14 10  CREATININE 0.80 0.85  CALCIUM  --  8.2*    PT/INR: No results for input(s): LABPROT, INR in the last 72 hours. ABG    Component Value Date/Time   PHART 7.353 04/09/2017 1106   HCO3 21.9 04/09/2017 1106   TCO2 28 04/09/2017 1631   ACIDBASEDEF 4.0 (H) 04/09/2017 1106   O2SAT 98.0 04/09/2017 1106   CBG (last 3)  No results for input(s): GLUCAP in the last 72 hours.  CXR: ok  Assessment/Plan: S/P Procedure(s)  (LRB): TRANSCATHETER AORTIC VALVE REPLACEMENT, TRANSFEMORAL (N/A) TRANSESOPHAGEAL ECHOCARDIOGRAM (TEE) (N/A)  She has been hemodynamically stable in sinus rhythm. Continue low dose beta blocker as tolerated.  ASA and Plavix  Expected postop blood loss anemia with preop anemia, Hgb 10.4 preop. Postop Hgb has been stable. Will put on some iron.  2D echo today.  Transfer to 2W and mobilize. Foley out. Keep central line in today since she has no peripheral access.  Plan home tomorrow.     LOS: 1 day    Gaye Pollack 04/10/2017

## 2017-04-10 NOTE — Progress Notes (Signed)
Progress Note Patient Name: Amy Kaiser Date of Encounter: 04/10/2017  Primary Cardiologist: Wynonia Lawman  Subjective  The patient is feeling well this morning. Her nausea has resolved. She denies chest pain or shortness of breath. She has walked around the unit this morning.  Inpatient Medications    Scheduled Meds: . aspirin EC  81 mg Oral Daily  . clopidogrel  75 mg Oral Daily  . feeding supplement (ENSURE ENLIVE)  237 mL Oral BID BM  . metoprolol tartrate  12.5 mg Oral BID  . pantoprazole  40 mg Oral Daily  . rosuvastatin  10 mg Oral q1800  . sodium chloride flush  3 mL Intravenous Q12H   Continuous Infusions: . sodium chloride 250 mL (04/10/17 0700)  . famotidine (PEPCID) IV    . lactated ringers    . levofloxacin (LEVAQUIN) IV     PRN Meds: sodium chloride, albuterol, lactated ringers, loperamide, LORazepam, metoprolol tartrate, ondansetron (ZOFRAN) IV, sodium chloride flush, traMADol   Vital Signs    Vitals:   04/10/17 0600 04/10/17 0645 04/10/17 0700 04/10/17 0729  BP:  (!) 133/47 (!) 116/38   Pulse: (!) 108 86 82   Resp: (!) 21 19 (!) 24   Temp:    98.6 F (37 C)  TempSrc:    Oral  SpO2: 93% 96% 94%   Weight:      Height:        Intake/Output Summary (Last 24 hours) at 04/10/17 0747 Last data filed at 04/10/17 0700  Gross per 24 hour  Intake          2473.48 ml  Output             1715 ml  Net           758.48 ml   Filed Weights   04/10/17 0500  Weight: 183 lb 3.2 oz (83.1 kg)    Telemetry    Normal sinus rhythm - Personally Reviewed  ECG    NSR with LAFB no significant change - Personally Reviewed  Physical Exam  Alert, oriented in NAD, elderly woman sitting in chair at bedside GEN: No acute distress.   Neck: No JVD Cardiac: RRR, 2/6 SEM at the RUSB Respiratory: Clear to auscultation bilaterally. GI: Soft, nontender, non-distended  MS: No edema; No deformity. BL groin sites clear Neuro:  Nonfocal  Psych: Normal affect   Labs      Chemistry Recent Labs Lab 04/05/17 1324  04/09/17 0927 04/09/17 1631 04/10/17 0338  NA 138  < > 143 144 139  K 5.2*  < > 3.8 4.3 3.9  CL 107  < > 107 105 105  CO2 22  --   --   --  26  GLUCOSE 100*  < > 128* 114* 109*  BUN 18  < > 16 14 10   CREATININE 0.91  < > 0.80 0.80 0.85  CALCIUM 8.5*  --   --   --  8.2*  PROT 7.2  --   --   --   --   ALBUMIN 3.6  --   --   --   --   AST 27  --   --   --   --   ALT 14  --   --   --   --   ALKPHOS 66  --   --   --   --   BILITOT 1.2  --   --   --   --   [facility]  56*  --   --   --  >60  GFRAA >60  --   --   --  >60  ANIONGAP 9  --   --   --  8  < > = values in this interval not displayed.   Hematology Recent Labs Lab 04/09/17 0608  04/09/17 1620 04/09/17 1631 04/10/17 0338  WBC 6.0  --  6.7  --  8.3  RBC 3.44*  --  2.73*  --  2.73*  HGB 10.4*  < > 8.3* 8.2* 8.4*  HCT 33.8*  < > 26.4* 24.0* 26.6*  MCV 98.3  --  96.7  --  97.4  MCH 30.2  --  30.4  --  30.8  MCHC 30.8  --  31.4  --  31.6  RDW 14.1  --  14.1  --  14.3  PLT 200  --  145*  --  140*  < > = values in this interval not displayed.  Cardiac EnzymesNo results for input(s): TROPONINI in the last 168 hours. No results for input(s): TROPIPOC in the last 168 hours.   BNPNo results for input(s): BNP, PROBNP in the last 168 hours.   DDimer No results for input(s): DDIMER in the last 168 hours.   Radiology    Dg Chest Port 1 View  Result Date: 04/09/2017 CLINICAL DATA:  Status post transcatheter aortic valve replacement. EXAM: PORTABLE CHEST 1 VIEW COMPARISON:  Portable chest x-ray of April 05, 2017 FINDINGS: A aortic valve cage is visible. The cardiac silhouette is normal in size. The pulmonary vascularity is not engorged. There is calcification in the wall of the aortic arch. The pulmonary interstitial markings are mildly prominent but a portion of this is due to overlapping densities external to the patient. There is a trace of pleural fluid or pleural thickening at the left  lung base laterally which is not new. On the left which is not new IMPRESSION: No immediate postprocedure complication following transcatheter aortic valve replacement. Probable very mild interstitial edema. Thoracic aortic atherosclerosis. Electronically Signed   By: David  Martinique M.D.   On: 04/09/2017 11:16    Cardiac Studies   POD #1 echo pending  Patient Profile     83 y.o. female with severe symptomatic aortic stenosis admitted for TAVR 04/09/17 treated with a 20 mm Sapien 3 valve  Assessment & Plan    1. Severe, Stage D, Aortic Stenosis: ASA/plavix, POD #1 echo, tx tele bed today, progressing well.  2. CAD with recent PCI of the RCA: continue DAPT with ASA and plavix  3. Anemia, acute on chronic: post-op blood loss, dilution, no signs of active bleeding. Start iron replacement.  Dispo: tx tele today, anticipate home tomorrow  Signed, Sherren Mocha, MD  04/10/2017, 7:47 AM

## 2017-04-10 NOTE — Progress Notes (Signed)
  Echocardiogram 2D Echocardiogram limited with definity has been performed.  Darlina Sicilian M 04/10/2017, 1:33 PM

## 2017-04-10 NOTE — Progress Notes (Signed)
Removed pts foley catheter at this time.

## 2017-04-10 NOTE — Discharge Summary (Addendum)
Discharge Summary    Patient ID: Amy Kaiser,  MRN: [id number], DOB/AGE: Jan 17, 1929 83 y.o.  Admit date: 04/09/2017 Discharge date: 04/11/2017  Primary Care Provider: Harlan Stains Primary Cardiologist: Wynonia Lawman   Discharge Diagnoses    Active Problems:   Severe aortic stenosis   Allergies Allergies  Allergen Reactions  . Latex Hives, Itching and Other (See Comments)    Caused blisters in her mouth  . Lisinopril Other (See Comments)    Weakness   . Penicillins Other (See Comments)    Intolerance to ALL "CILLINS" > UNSPECIFIED REACTIONS  Has patient had a PCN reaction causing immediate rash, facial/tongue/throat swelling, SOB or lightheadedness with hypotension: UNSPECIFIED REACTION  Has patient had a PCN reaction causing severe rash involving mucus membranes or skin necrosis: UNSPECIFIED REACTION  Has patient had a PCN reaction that required hospitalization UNSPECIFIED REACTION  Has patient had a PCN reaction occurring within the last 10 years: UNSPECIFIED REACTION   . Pravachol [Pravastatin Sodium] Other (See Comments)    MYALGIAs LEG PAIN   . Alendronate Sodium Other (See Comments)    UNSPECIFIED REACTION  [Patient denies this allergy]   . Codeine Nausea And Vomiting  . Morphine And Related Nausea And Vomiting  . Prozac [Fluoxetine Hcl] Anxiety    "FEELS SHAKY"   . Symbicort [Budesonide-Formoterol Fumarate] Anxiety    "SHAKY"   . Wellbutrin [Bupropion] Anxiety    "JITTERY"     Diagnostic Studies/Procedures    TAVR: 04/09/17  Procedure:        Transcatheter Aortic Valve Replacement - Percutaneous Transfemoral Approach             Edwards Sapien 3 THV (size 20 mm, model # 9600TFX, serial # [id number])              Co-Surgeons:                        Gilford Raid, MD and Sherren Mocha, MD  Anesthesiologist:                  Roberts Gaudy, MD  Echocardiographer:              Ena Dawley, MD  Pre-operative Echo Findings: ? Severe aortic  stenosis ? Normal left ventricular systolic function   Post-operative Echo Findings: ? Trace paravalvular leak ? Normal left ventricular systolic function _____________   History of Present Illness     83 year old woman who has stage D, severe, symptomatic aortic stenosis with progressive exertional shortness of breath and fatigue consistent with NYHA class III chronic diastolic heart failure. She also had some chest discomfort and dizziness at times. Her cath showed some stenosis of the RCA ostium that worsened with further engagement of the ostium and it was unclear if it worsened from spasm or that the degree of stenosis was worse than it appeared initially. Her gated cardiac CT with FFR of the RCA indicated that it may be more significant than initially felt with an FFR of 0.65. There are no other significant coronary stenoses. She underwent PCI with a DES to the RCA on 03/11/2017. She has severe AS with a trileaflet valve with mild calcified, thickened leaflets with restricted mobility. After being evaluated by Dr. Burt Knack and Dr. Cyndia Bent, the patient was felt to treated best by TAVR.   Hospital Course     Consultants: CVTS  Underwent successful TAVR with Edwards Sapien 3 (82mm) on 04/09/17. Noted to felt  well post op. Had mils post op anemia, and started on iron supplement. Plan for DAPT with ASA/plavix. Lines were dc'ed and she was transferred to telemetry on 04/10/17. Follow up echo showed mean gradient 6 mmHg, peak gradient 13 mmHg, LVEF 65%, and no paravalvular regurgitation. Hgb was stable at 8.4 mg/dL at the time of discharge.  She was seen by Dr. Burt Knack and determined stable for discharge home. Follow up in the office has been arranged. Medications are listed below.  _____________  Discharge Vitals Blood pressure (!) 118/46, pulse 82, temperature 98.9 F (37.2 C), temperature source Oral, resp. rate 19, height 5\' 6"  (1.676 m), weight 195 lb 3.2 oz (88.5 kg), SpO2 91 %.  Filed Weights     04/10/17 0500 04/11/17 0500  Weight: 183 lb 3.2 oz (83.1 kg) 195 lb 3.2 oz (88.5 kg)    Labs & Radiologic Studies    CBC  Recent Labs  04/09/17 1620 04/09/17 1631 04/10/17 0338  WBC 6.7  --  8.3  HGB 8.3* 8.2* 8.4*  HCT 26.4* 24.0* 26.6*  MCV 96.7  --  97.4  PLT 145*  --  749*   Basic Metabolic Panel  Recent Labs  04/09/17 1620 04/09/17 1631 04/10/17 0338  NA  --  144 139  K  --  4.3 3.9  CL  --  105 105  CO2  --   --  26  GLUCOSE  --  114* 109*  BUN  --  14 10  CREATININE  --  0.80 0.85  CALCIUM  --   --  8.2*  MG 1.9  --   --    Liver Function Tests No results for input(s): AST, ALT, ALKPHOS, BILITOT, PROT, ALBUMIN in the last 72 hours. No results for input(s): LIPASE, AMYLASE in the last 72 hours. Cardiac Enzymes  Recent Labs  04/09/17 1620  CKTOTAL 115   BNP Invalid input(s): POCBNP D-Dimer No results for input(s): DDIMER in the last 72 hours. Hemoglobin A1C No results for input(s): HGBA1C in the last 72 hours. Fasting Lipid Panel No results for input(s): CHOL, HDL, LDLCALC, TRIG, CHOLHDL, LDLDIRECT in the last 72 hours. Thyroid Function Tests No results for input(s): TSH, T4TOTAL, T3FREE, THYROIDAB in the last 72 hours.  Invalid input(s): FREET3 _____________    Dg Chest Port 1 View  Result Date: 04/10/2017 CLINICAL DATA:  Shortness of breath. EXAM: PORTABLE CHEST 1 VIEW COMPARISON:  04/09/2017 . FINDINGS: Right IJ line stable position. Prior cardiac valve replacement. Cardiomegaly with normal pulmonary vascularity. No focal infiltrate. Mild basilar atelectasis. Small left pleural effusion . IMPRESSION: 1. Right IJ line and stable position. 2. Prior cardiac valve replacement. Heart size stable. Interim near complete clearing of pulmonary interstitial edema. Persistent small bilateral pleural effusions. 3. Low lung volumes with mild basilar atelectasis. Electronically Signed   By: Marcello Moores  Register   On: 04/10/2017 07:54   Dg Chest Port 1  View  Result Date: 04/09/2017 CLINICAL DATA:  Status post transcatheter aortic valve replacement. EXAM: PORTABLE CHEST 1 VIEW COMPARISON:  Portable chest x-ray of April 05, 2017 FINDINGS: A aortic valve cage is visible. The cardiac silhouette is normal in size. The pulmonary vascularity is not engorged. There is calcification in the wall of the aortic arch. The pulmonary interstitial markings are mildly prominent but a portion of this is due to overlapping densities external to the patient. There is a trace of pleural fluid or pleural thickening at the left lung base laterally which is not new.  On the left which is not new IMPRESSION: No immediate postprocedure complication following transcatheter aortic valve replacement. Probable very mild interstitial edema. Thoracic aortic atherosclerosis. Electronically Signed   By: David  Martinique M.D.   On: 04/09/2017 11:16   Disposition   Pt is being discharged home today in good condition.  Follow-up Plans & Appointments    Follow-up Information    [place], Crista [place], [place] Follow up on 04/22/2017.   Specialty:  Cardiology Why:  at 9:30am for your follow up appt. Please arrive by 9:15am to check in. Contact information: [address] [place] [address] [phone]          Discharge Instructions    Call MD for:  redness, tenderness, or signs of infection (pain, swelling, redness, odor or green/yellow discharge around incision site)    Complete by:  As directed    Diet - low sodium heart healthy    Complete by:  As directed    Discharge instructions    Complete by:  As directed    ACTIVITY AND EXERCISE . Daily activity and exercise are an important part of your recovery. People recover at different rates depending on their general health and type of valve procedure. . Most people require six to 10 weeks to feel recovered. . No lifting, pushing, pulling more than 10 pounds (examples to avoid: groceries, vacuuming, gardening,  golfing):  - For one week with a procedure through the groin.  - For six weeks for procedures through the chest wall.  - For three months for procedures through the breast-bone. NOTE: You will typically see one of our providers 7-10 days after your procedure to discuss [place] the above activities.    DRIVING . Do not drive for four weeks after the date of your procedure. . If you have been told by your doctor in the past that you may not drive, you must talk with him/her before you begin driving again. . When you resume driving, you must have someone with you.   DRESSING . Groin site: you may leave the clear dressing over the site for up to one week or until it falls off.   HYGIENE . If you had a femoral (leg) procedure, you may take a shower when you return home. After the shower, pat the site dry. Do NOT use powder, oils or lotions in your groin area until the site has completely healed. . If you had a chest procedure, you may shower when you return home unless specifically instructed not to by your discharging practitioner.  - DO NOT scrub incision; pat dry with a towel  - DO NOT apply any lotions, oils, powders to the incision  - No tub baths / swimming for at least six weeks. . If you notice any fevers, chills, increased pain, swelling, bleeding or pus, please contact your doctor.  ADDITIONAL INFORMATION . If you are going to have an upcoming dental procedure, please contact our office as you may require antibiotics ahead of time to prevent infection on your heart valve.   Increase activity slowly    Complete by:  As directed       Discharge Medications   Current Discharge Medication List    START taking these medications   Details  ferrous gluconate (FERGON) 324 MG tablet Take 1 tablet (324 mg total) by mouth 2 (two) times daily with a meal. Qty: 60 tablet, Refills: 3      CONTINUE these medications which have CHANGED  Details  metoprolol tartrate  (LOPRESSOR) 25 MG tablet Take 0.5 tablets (12.5 mg total) by mouth 2 (two) times daily. Qty: 60 tablet, Refills: 2      CONTINUE these medications which have NOT CHANGED   Details  albuterol (ACCUNEB) 0.63 MG/3ML nebulizer solution Take 1 ampule by nebulization every 6 (six) hours as needed for wheezing.    aspirin EC 81 MG tablet Take 81 mg by mouth daily.    calcium-vitamin D (OSCAL WITH D) 500-200 MG-UNIT tablet Take 1 tablet by mouth 2 (two) times daily.    Cholecalciferol (VITAMIN D3) 1000 units CAPS Take 1,000 Units by mouth daily with lunch.     clopidogrel (PLAVIX) 75 MG tablet Take 1 tablet (75 mg total) by mouth daily. Qty: 30 tablet, Refills: 11    FLOVENT HFA 220 MCG/ACT inhaler Take 1 puff by mouth daily as needed (wheezing). sleep Refills: 12    Multiple Vitamins-Minerals (ICAPS AREDS 2 PO) Take 1 capsule by mouth 2 (two) times daily.    rosuvastatin (CRESTOR) 10 MG tablet Take 10 mg by mouth daily with supper.     VENTOLIN HFA 108 (90 BASE) MCG/ACT inhaler Inhale 2 puffs into the lungs 2 (two) times daily as needed for wheezing or shortness of breath.  Refills: 2    loperamide (IMODIUM A-D) 2 MG tablet Take 2 mg by mouth as needed for diarrhea or loose stools.    LORazepam (ATIVAN) 0.5 MG tablet Take 0.5 mg by mouth at bedtime as needed for sleep.  Refills: 2    nitroGLYCERIN (NITROSTAT) 0.4 MG SL tablet Place 1 tablet (0.4 mg total) under the tongue every 5 (five) minutes as needed. Qty: 25 tablet, Refills: 3           Outstanding Labs/Studies   Follow up echo in one month.   Duration of Discharge Encounter   Greater than 30 minutes including physician time.  Signed, Reino Bellis NP-C 04/11/2017, 10:25 AM   Patient seen, examined. Available data reviewed. Agree with findings, assessment, and plan as outlined by Reino Bellis, NP. I have updated the note where appropriate. My exam today: Vitals:   04/11/17 0500 04/11/17 0925  BP: (!) 139/49 (!)  118/46  Pulse: 98 82  Resp: 19   Temp: 98.9 F (37.2 C)    Pt is alert and oriented, NAD HEENT: normal Neck: JVP - normal Lungs: CTA bilaterally CV: RRR with 2/6 SEM at the RUSB Abd: soft, NT, Positive BS, no hepatomegaly Ext: no C/C/E, distal pulses intact and equal, BL groin sites clear (bandages removed) Skin: warm/dry no rash  Reviewed medications with patient. Echo reviewed with normal valve function. She will continue on ASA, plavix, and iron replacement. Will arrange 30-day valve clinic follow-up. Stable for discharge this morning.  Sherren Mocha, M.D. 04/11/2017 10:25 AM

## 2017-04-10 NOTE — Progress Notes (Signed)
Report called to Anderson Malta, RN on 2W

## 2017-04-11 LAB — ECHOCARDIOGRAM LIMITED
HEIGHTINCHES: 66 in
WEIGHTICAEL: 2931.24 [oz_av]

## 2017-04-11 MED ORDER — FERROUS GLUCONATE 324 (38 FE) MG PO TABS: 324.0000 mg | ORAL_TABLET | Freq: Two times a day (BID) | ORAL | 3 refills | Status: DC

## 2017-04-11 MED ORDER — METOPROLOL TARTRATE 25 MG PO TABS: 12.5000 mg | ORAL_TABLET | Freq: Two times a day (BID) | ORAL | 2 refills | Status: DC

## 2017-04-11 NOTE — Progress Notes (Signed)
Telemetry box removed for discharge and CCMD notified. Pt and pt's daughter received discharge instructions and all questions were answered. Right IJ removed. Pressure applied and gauze dressing applied. Pt on bedrest for 30 minutes. Pt to discharge with daughter after bedrest is complete.  Grant Fontana BSN, RN

## 2017-04-11 NOTE — Care Management Note (Signed)
Case Management Note Marvetta Gibbons RN, BSN Unit 2W-Case Manager-- [place] coverage [phone]  Patient Details  Name: Amy Kaiser MRN: [id number] Date of Birth: 02-18-1929  Subjective/Objective:   Pt s/p TAVR on 04/09/17                 Action/Plan: PTA pt lived at home alone, independent- CM to follow for d/c needs - pt has supportive family (son and daughter)  Expected Discharge Date:  04/11/17               Expected Discharge Plan:  Home/Self Care  In-House Referral:     Discharge planning Services  CM Consult  Post Acute Care Choice:    Choice offered to:     DME Arranged:    DME Agency:     HH Arranged:    [place] Agency:     Status of Service:  Completed, signed off  If discussed at [place] of Stay Meetings, dates discussed:    Discharge Disposition: home/self care   Additional Comments:  Dawayne Patricia, RN 04/11/2017, 2:31 PM

## 2017-04-11 NOTE — Progress Notes (Addendum)
      [address]       [place] [address]             [phone]        2 Days Post-Op Procedure(s) (LRB): TRANSCATHETER AORTIC VALVE REPLACEMENT, TRANSFEMORAL (N/A) TRANSESOPHAGEAL ECHOCARDIOGRAM (TEE) (N/A)  Subjective: Patient with a little soreness lateral right groin.  Objective: Vital signs in last 24 hours: Temp:  [98.3 F (36.8 C)-99 F (37.2 C)] 98.9 F (37.2 C) (06/07 0500) Pulse Rate:  [76-102] 98 (06/07 0500) Cardiac Rhythm: Normal sinus rhythm (06/06 2029) Resp:  [18-24] 19 (06/07 0500) BP: (116-156)/(39-64) 139/49 (06/07 0500) SpO2:  [91 %-100 %] 91 % (06/07 0500) Weight:  [88.5 kg (195 lb 3.2 oz)] 88.5 kg (195 lb 3.2 oz) (06/07 0500)   Current Weight  04/11/17 88.5 kg (195 lb 3.2 oz)      Intake/Output from previous day: 06/06 0701 - 06/07 0700 In: 1120 [P.O.:900; I.V.:70; IV Piggyback:150] Out: 326 [Urine:875]   Physical Exam:  Cardiovascular: RRR Pulmonary: Clear to auscultation bilaterally Abdomen: Soft, non tender, bowel sounds present. Extremities: No lower extremity edema. Palpable DP bilaterally and feet warm. Wounds: Clean and dry.  No hematoma.  Lab Results: CBC: Recent Labs  04/09/17 1620 04/09/17 1631 04/10/17 0338  WBC 6.7  --  8.3  HGB 8.3* 8.2* 8.4*  HCT 26.4* 24.0* 26.6*  PLT 145*  --  140*   BMET:  Recent Labs  04/09/17 1631 04/10/17 0338  NA 144 139  K 4.3 3.9  CL 105 105  CO2  --  26  GLUCOSE 114* 109*  BUN 14 10  CREATININE 0.80 0.85  CALCIUM  --  8.2*    PT/INR:  Lab Results  Component Value Date   INR 1.01 04/05/2017   INR 0.96 03/11/2017   INR 0.9 02/15/2017   ABG:  INR: Will add last result for INR, ABG once components are confirmed Will add last 4 CBG results once components are confirmed  Assessment/Plan:  1. CV - Hypertensive at times. On Lopressor 12.5 mg bid and Plavix 75 mg daily. Was on Lopressor 25 mg bid pre op so likely increase. Echo done yesterday showed LVEF  60-65%, peak gradient 30 mm Hg, AVA 1.59 cm2, no significant paravalvular leak.  2.  Pulmonary - On room air. 3.  Acute blood loss anemia - H and H stable at 8.4 and 26.6. Continue Fergon. 4. Mild thromboctyopenia-platelets 140,000 5. Likely discharge today  ZIMMERMAN,DONIELLE MPA-C 04/11/2017,7:44 AM   Chart reviewed, patient examined, agree with above. She looks great this am. Echo from yesterday reviewed and mean gradient is 15 mm Hg which is quite acceptable with a 20 mm valve. No paravalvular leak, normal LVEF. She can go home today.

## 2017-04-11 NOTE — Progress Notes (Signed)
CARDIAC REHAB PHASE I   PRE:  Rate/Rhythm: 84 SR    BP: sitting 120/64    SaO2: 92 RA  MODE:  Ambulation: 250 ft   POST:  Rate/Rhythm: 107 ST    BP: sitting 136/60     SaO2: 96 RA   Pt's right groin very sore. She struggled to stand and unsteady standing due to pain and weakness. Got RW for her (we were going to try without it). She was steady with RW. Her groin pain improved with walking/distance. Still tender though, esp in abdomen, up above incision. "That one little spot", she grimaces when it is pressed. Discussed restrictions, watching for bleed, increasing activity, IS, and CRPII. She voiced understanding and requests her referral be sent to [place]. She is not driving now so will need her family to bring her.  [place], ACSM 04/11/2017 10:26 AM

## 2017-04-12 ENCOUNTER — Telehealth (HOSPITAL_COMMUNITY): Payer: Self-pay

## 2017-04-12 NOTE — Telephone Encounter (Signed)
Patient insurance is active and benefits verified. Patient insurance is [employer]- no co-payment, no deductible, out of pocket $5500/$793.71 has been met, no co-insurance, no pre-authorization and no limit on visit. Passport/reference [phone].   Patient will be contacted and scheduled after their follow up appointment with the cardiologist on 04/22/17, upon review by [facility] RN navigator.

## 2017-04-15 ENCOUNTER — Telehealth: Payer: Self-pay | Admitting: Cardiovascular Disease

## 2017-04-15 NOTE — Telephone Encounter (Signed)
I spoke with the pt's daughter [name] and she said the pt is doing well after surgery.  She has been up walking and took a shower yesterday. [name] has been checking the pt's groin site 2-3 times per day since discharge from the hospital. This morning [name] noticed a hard knot at surgical site the size of a dime/nickel.  The pt is not having any bleeding or pain at site, does have soreness. Per [name] the pt wore underwear yesterday and this could have irritated the area.  I offered to have the pt come into the office for a groin check but at this time they would like to continue with observation of site. [name] will contact the office if she notices any enlargement of knot or pt develops additional symptoms.

## 2017-04-15 NOTE — Telephone Encounter (Signed)
New message      Pt had a procedure last Tuesday.  Daughter noticed a hard knot at her groin incision area.  She states that it was not there yesterday.  It is the size of a dime to a nickle.  Should pt be concerned?  Please advise

## 2017-04-18 NOTE — Progress Notes (Signed)
Cardiology Office Note    Date:  04/22/2017   ID:  Amy Kaiser, DOB 1928-11-28, MRN [id number]  PCP:  Harlan Stains, MD  Cardiologist:  Dr. Traci Sermon: Dr. Burt Knack  Chief Complaint: Hospital follow up s/p  TVAR  History of Present Illness:   Amy Kaiser is a 83 y.o. female CAD, COPD, HLD and severe symptomatic aortic stenosis with progressive exertional shortness of breath and fatigue consistent with NYHA class III chronic diastolic heart failure now s/p TVAR present for follow up.   Her gated cardiac CT with FFR of the RCA indicatedthat it may be more significant than initially felt with an FFR of 0.65. There are no other significant coronary stenoses. She underwent PCI with a DES to the RCA on 03/11/2017.  Underwent successful TAVR with Edwards Sapien 3 (24mm) on 04/09/17. Had mils post op anemia, and started on iron supplement. Plan for DAPT with ASA/plavix. Follow up echo showed mean gradient 6 mmHg, peak gradient 13 mmHg, LVEF 65%, and no paravalvular regurgitation. Hgb was stable at 8.4 mg/dL at the time of discharge.  Here today for follow up. Her "hard knot" is getting smaller. She is walking 5-10 minutes at a time without any chest pain, dyspnea or groin discomfort. She has been compliant with medication. She has a black stool however on iron therapy. No  complaints. She is very pleased with her surgery, felt like second life.   Past Medical History:  Diagnosis Date  . Anemia   . Anxiety   . Arthritis    "just about all over" (03/11/2017)  . Asthma   . Cervical cancer ([place])   . COPD (chronic obstructive pulmonary disease) ([place])   . Coronary artery disease   . Depression   . Diabetes mellitus without complication ([place])    pt denies this hx on 03/11/2017, patient denies this states she has low blood sugar  . Heart murmur   . Hyperlipidemia   . Low blood sugar   . Migraine    "work related; quit when I quit work in ~ 1997" (03/11/2017)  . Skin cancer    back     Past Surgical History:  Procedure Laterality Date  . APPENDECTOMY    . CARDIAC CATHETERIZATION  02/2017  . CATARACT EXTRACTION W/ INTRAOCULAR LENS  IMPLANT, BILATERAL Bilateral   . CORONARY ANGIOPLASTY WITH STENT PLACEMENT  03/11/2017  . CORONARY STENT INTERVENTION N/A 03/11/2017   Procedure: Coronary Stent Intervention;  Surgeon: Sherren Mocha, MD;  Location: [facility];  Service: Cardiovascular;  Laterality: N/A;  . IR RADIOLOGY PERIPHERAL GUIDED IV START  02/26/2017  . IR US GUIDE VASC ACCESS RIGHT  02/26/2017  . RIGHT/LEFT HEART CATH AND CORONARY ANGIOGRAPHY N/A 02/20/2017   Procedure: Right/Left Heart Cath and Coronary Angiography;  Surgeon: Sherren Mocha, MD;  Location: [facility];  Service: Cardiovascular;  Laterality: N/A;  . SKIN CANCER EXCISION     "back"  . TEE WITHOUT CARDIOVERSION N/A 04/09/2017   Procedure: TRANSESOPHAGEAL ECHOCARDIOGRAM (TEE);  Surgeon: Sherren Mocha, MD;  Location: [facility];  Service: Open Heart Surgery;  Laterality: N/A;  . TRANSCATHETER AORTIC VALVE REPLACEMENT, TRANSFEMORAL N/A 04/09/2017   Procedure: TRANSCATHETER AORTIC VALVE REPLACEMENT, TRANSFEMORAL;  Surgeon: Sherren Mocha, MD;  Location: [facility];  Service: Open Heart Surgery;  Laterality: N/A;  . VAGINAL HYSTERECTOMY      Current Medications: Prior to Admission medications   Medication Sig Start Date End Date Taking? Authorizing Provider  albuterol (ACCUNEB) 0.63  MG/3ML nebulizer solution Take 1 ampule by nebulization every 6 (six) hours as needed for wheezing.    [provider]  aspirin EC 81 MG tablet Take 81 mg by mouth daily.    [provider]  calcium-vitamin D (OSCAL WITH D) 500-200 MG-UNIT tablet Take 1 tablet by mouth 2 (two) times daily.    [provider]  Cholecalciferol (VITAMIN D3) 1000 units CAPS Take 1,000 Units by mouth daily with lunch.     [provider]  clopidogrel (PLAVIX) 75 MG tablet Take 1 tablet (75 mg total) by mouth  daily. 03/04/17 03/04/18  Sherren Mocha, MD  ferrous gluconate (FERGON) 324 MG tablet Take 1 tablet (324 mg total) by mouth 2 (two) times daily with a meal. 04/11/17   Cheryln Manly, NP  FLOVENT HFA 220 MCG/ACT inhaler Take 1 puff by mouth daily as needed (wheezing). sleep 04/07/15   [provider]  loperamide (IMODIUM A-D) 2 MG tablet Take 2 mg by mouth as needed for diarrhea or loose stools.    [provider]  LORazepam (ATIVAN) 0.5 MG tablet Take 0.5 mg by mouth at bedtime as needed for sleep.  04/08/15   [provider]  metoprolol tartrate (LOPRESSOR) 25 MG tablet Take 0.5 tablets (12.5 mg total) by mouth 2 (two) times daily. 04/11/17   Cheryln Manly, NP  Multiple Vitamins-Minerals (ICAPS AREDS 2 PO) Take 1 capsule by mouth 2 (two) times daily.    [provider]  nitroGLYCERIN (NITROSTAT) 0.4 MG SL tablet Place 1 tablet (0.4 mg total) under the tongue every 5 (five) minutes as needed. Patient taking differently: Place 0.4 mg under the tongue every 5 (five) minutes as needed for chest pain.  03/12/17   Cheryln Manly, NP  rosuvastatin (CRESTOR) 10 MG tablet Take 10 mg by mouth daily with supper.     [provider]  VENTOLIN HFA 108 (90 BASE) MCG/ACT inhaler Inhale 2 puffs into the lungs 2 (two) times daily as needed for wheezing or shortness of breath.  03/14/15   [provider]    Allergies:   Latex; Lisinopril; Penicillins; Pravachol [pravastatin sodium]; Alendronate sodium; Codeine; Morphine and related; Prozac [fluoxetine hcl]; Symbicort [budesonide-formoterol fumarate]; and Wellbutrin [bupropion]   Social History   Social History  . Marital status: Widowed    Spouse name: N/A  . Number of children: 3  . Years of education: N/A   Social History Main Topics  . Smoking status: Never Smoker  . Smokeless tobacco: Never Used  . Alcohol use No  . Drug use: No  . Sexual activity: Not Currently   Other Topics Concern  . Not  on file   Social History Narrative  . No narrative on file     Family History:  The patient's family history is not on file. *  ROS:   Please see the history of present illness.    ROS All other systems reviewed and are negative.   PHYSICAL EXAM:   VS:  BP (!) 154/66   Pulse 68   Ht 5\' 6"  (1.676 m)   Wt 179 lb (81.2 kg)   BMI 28.89 kg/m    GEN: Well nourished, well developed, in no acute distress  HEENT: normal  Neck: no JVD, carotid bruits, or masses Cardiac: RRR; 3-2/3 systolic murmurs, rubs, or gallops,no edema. Right groin cath site has a small hematoma without erythema. Respiratory:  clear to auscultation bilaterally, normal work of breathing GI: soft, nontender, nondistended, +  BS MS: no deformity or atrophy  Skin: warm and dry, no rash Neuro:  Alert and Oriented x 3, Strength and sensation are intact Psych: euthymic mood, full affect  Wt Readings from Last 3 Encounters:  04/22/17 179 lb (81.2 kg)  04/11/17 195 lb 3.2 oz (88.5 kg)  04/08/17 164 lb (74.4 kg)      Studies/Labs Reviewed:   EKG:  EKG is not  ordered today.    Recent Labs: 04/05/2017: ALT 14 04/09/2017: Magnesium 1.9 04/10/2017: BUN 10; Creatinine, Ser 0.85; Hemoglobin 8.4; Platelets 140; Potassium 3.9; Sodium 139   Lipid Panel No results found for: CHOL, TRIG, HDL, CHOLHDL, VLDL, LDLCALC, LDLDIRECT  Additional studies/ records that were reviewed today include:   Echocardiogram: 04/10/17 Study Conclusions  - Left ventricle: The cavity size was normal. Wall thickness was   increased in a pattern of mild LVH. Systolic function was normal.   The estimated ejection fraction was in the range of 60% to 65%. - Aortic valve: Post TAVR with 20 mm Sapien 3 TEE post deployment   had mean gradient 6 mmHg peak 13 mmHg and AVA 2.3 cm2 Now AVA   1.59 cm2 with mean gradient increased to 15 mmHg peak 30 mmHg no   significant perivalvular regurgitation. Valve area (VTI): 1.66   cm^2. Valve area (Vmax): 1.5  cm^2. Valve area (Vmean): 1.47 cm^2. - Mitral valve: Calcified annulus. There was mild regurgitation. - Left atrium: The atrium was mildly dilated. - Atrial septum: No defect or patent foramen ovale was identified.    ASSESSMENT & PLAN:   1. Severe symptomatic aortic stenosis  S/p  TVAR - Follow up echo showed mean gradient 6 mmHg, peak gradient 13 mmHg, LVEF 65%, and no paravalvular regurgitation. Follow up with Dr. Burt Knack with echo in 1 months. Asymptomatic  2. CAD - Continue DAPT. No chest pain or dyspnea.  3. Chronic diastolic CHF - Volume status stable. She does not require any Lasix.  4. Hyperlipidemia - Continue statin.  5. Hypertension - Initial blood pressure of 154/66. Repeat check 146/70. Advised to keep blood pressure log and bring during follow-up visit.   6. Right groin hematoma - Improving. No signs of infection. Mild bleeding with palpation. No bruit.   7.  Anemia - CheckCBC today.    Medication Adjustments/Labs and Tests Ordered: Current medicines are reviewed at length with the patient today.  Concerns regarding medicines are outlined above.  Medication changes, Labs and Tests ordered today are listed in the Patient Instructions below. There are no Patient Instructions on file for this visit.   Jarrett Soho, [place]  04/22/2017 10:08 AM    [facility] [place], [place],   [address] Phone: [phone]; Fax: [phone]

## 2017-04-22 ENCOUNTER — Ambulatory Visit (INDEPENDENT_AMBULATORY_CARE_PROVIDER_SITE_OTHER): Payer: Medicare Other | Admitting: Physician Assistant

## 2017-04-22 VITALS — BP 154/66 | HR 68 | Ht 66.0 in | Wt 179.0 lb

## 2017-04-22 DIAGNOSIS — E785 Hyperlipidemia, unspecified: Secondary | ICD-10-CM | POA: Diagnosis not present

## 2017-04-22 DIAGNOSIS — I10 Essential (primary) hypertension: Secondary | ICD-10-CM

## 2017-04-22 DIAGNOSIS — Z955 Presence of coronary angioplasty implant and graft: Secondary | ICD-10-CM | POA: Diagnosis not present

## 2017-04-22 DIAGNOSIS — D649 Anemia, unspecified: Secondary | ICD-10-CM | POA: Diagnosis not present

## 2017-04-22 DIAGNOSIS — S301XXA Contusion of abdominal wall, initial encounter: Secondary | ICD-10-CM

## 2017-04-22 DIAGNOSIS — Z952 Presence of prosthetic heart valve: Secondary | ICD-10-CM

## 2017-04-22 DIAGNOSIS — I5032 Chronic diastolic (congestive) heart failure: Secondary | ICD-10-CM

## 2017-04-22 LAB — CBC
HEMATOCRIT: 29.4 % — AB (ref 34.0–46.6)
HEMOGLOBIN: 9.4 g/dL — AB (ref 11.1–15.9)
MCH: 30.4 pg (ref 26.6–33.0)
MCHC: 32 g/dL (ref 31.5–35.7)
MCV: 95 fL (ref 79–97)
Platelets: 300 10*3/uL (ref 150–379)
RBC: 3.09 x10E6/uL — ABNORMAL LOW (ref 3.77–5.28)
RDW: 13.9 % (ref 12.3–15.4)
WBC: 7.7 10*3/uL (ref 3.4–10.8)

## 2017-04-22 NOTE — Patient Instructions (Signed)
Medication Instructions:    Your physician recommends that you continue on your current medications as directed. Please refer to the Current Medication list given to you today.   If you need a refill on your cardiac medications before your next appointment, please call your pharmacy.  Labwork: CBC TODAY    Testing/Procedures: NONE ORDERED  TODAY    Follow-Up: WITH DR Burt Knack AS SCHEDULED   Any Other Special Instructions Will Be Listed Below (If Applicable).

## 2017-04-26 ENCOUNTER — Telehealth (HOSPITAL_COMMUNITY): Payer: Self-pay

## 2017-04-26 NOTE — Telephone Encounter (Signed)
I called and spoke to patient about scheduling for cardiac rehab, patient stated she would like to wait until she sees Dr. Burt Knack on 05/06/17. I informed patient that I would follow up with her after that appointment. Patient confirmed.

## 2017-05-05 NOTE — Progress Notes (Signed)
Cardiology Office Note Date:  05/06/2017   ID:  Amy Kaiser, DOB Oct 28, 1929, MRN [id number]  PCP:  Harlan Stains, MD  Cardiologist:  Sherren Mocha, MD    Chief Complaint  Patient presents with  . Follow-up    TAVR     History of Present Illness: Amy Kaiser is a 83 y.o. female who presents for 30-day TAVR follow-up. The patient has a history of severe aortic stenosis with progressive symptoms of diastolic heart failure and she underwent TAVR with a 20 mm Sapien 3 valve he had a percutaneous transfemoral approach 04/09/2017. During her preoperative evaluation she was found to have very tight stenosis of the proximal RCA and she underwent stenting with a 4 mm drug-eluting stent 03/11/2017. The patient's early postoperative course was uncomplicated.  The patient is here with her daughter today. She is doing very well. She's been able to work in her garden without symptoms. States that her breathing is markedly improved. She hasn't had use her inhaler for the last few weeks at all. She denies chest pain, chest pressure, edema, or heart palpitations. She's had no lightheadedness.  Past Medical History:  Diagnosis Date  . Anemia   . Anxiety   . Arthritis    "just about all over" (03/11/2017)  . Asthma   . Cervical cancer ([place])   . COPD (chronic obstructive pulmonary disease) ([place])   . Coronary artery disease   . Depression   . Diabetes mellitus without complication ([place])    pt denies this hx on 03/11/2017, patient denies this states she has low blood sugar  . Heart murmur   . Hyperlipidemia   . Low blood sugar   . Migraine    "work related; quit when I quit work in ~ 1997" (03/11/2017)  . Skin cancer    back    Past Surgical History:  Procedure Laterality Date  . APPENDECTOMY    . CARDIAC CATHETERIZATION  02/2017  . CATARACT EXTRACTION W/ INTRAOCULAR LENS  IMPLANT, BILATERAL Bilateral   . CORONARY ANGIOPLASTY WITH STENT PLACEMENT  03/11/2017  . CORONARY STENT  INTERVENTION N/A 03/11/2017   Procedure: Coronary Stent Intervention;  Surgeon: Sherren Mocha, MD;  Location: [facility];  Service: Cardiovascular;  Laterality: N/A;  . IR RADIOLOGY PERIPHERAL GUIDED IV START  02/26/2017  . IR US GUIDE VASC ACCESS RIGHT  02/26/2017  . RIGHT/LEFT HEART CATH AND CORONARY ANGIOGRAPHY N/A 02/20/2017   Procedure: Right/Left Heart Cath and Coronary Angiography;  Surgeon: Sherren Mocha, MD;  Location: [facility];  Service: Cardiovascular;  Laterality: N/A;  . SKIN CANCER EXCISION     "back"  . TEE WITHOUT CARDIOVERSION N/A 04/09/2017   Procedure: TRANSESOPHAGEAL ECHOCARDIOGRAM (TEE);  Surgeon: Sherren Mocha, MD;  Location: [facility];  Service: Open Heart Surgery;  Laterality: N/A;  . TRANSCATHETER AORTIC VALVE REPLACEMENT, TRANSFEMORAL N/A 04/09/2017   Procedure: TRANSCATHETER AORTIC VALVE REPLACEMENT, TRANSFEMORAL;  Surgeon: Sherren Mocha, MD;  Location: [facility];  Service: Open Heart Surgery;  Laterality: N/A;  . VAGINAL HYSTERECTOMY      Current Outpatient Prescriptions  Medication Sig Dispense Refill  . aspirin EC 81 MG tablet Take 81 mg by mouth daily.    . calcium-vitamin D (OSCAL WITH D) 500-200 MG-UNIT tablet Take 1 tablet by mouth 2 (two) times daily.    . Cholecalciferol (VITAMIN D3) 1000 units CAPS Take 1,000 Units by mouth daily with lunch.     . clopidogrel (PLAVIX) 75 MG tablet Take 1 tablet (75  mg total) by mouth daily. 30 tablet 11  . ferrous gluconate (FERGON) 324 MG tablet Take 1 tablet (324 mg total) by mouth daily with breakfast. 60 tablet 3  . FLOVENT HFA 220 MCG/ACT inhaler Take 1 puff by mouth daily as needed (wheezing).   12  . loperamide (IMODIUM A-D) 2 MG tablet Take 2 mg by mouth as needed for diarrhea or loose stools.    . metoprolol tartrate (LOPRESSOR) 25 MG tablet Take 0.5 tablets (12.5 mg total) by mouth 2 (two) times daily. 60 tablet 2  . Multiple Vitamins-Minerals (ICAPS AREDS 2 PO) Take 1 capsule by mouth 2 (two) times  daily.    . nitroGLYCERIN (NITROSTAT) 0.4 MG SL tablet Place 0.4 mg under the tongue every 5 (five) minutes as needed for chest pain.    . rosuvastatin (CRESTOR) 10 MG tablet Take 10 mg by mouth daily with supper.     . VENTOLIN HFA 108 (90 BASE) MCG/ACT inhaler Inhale 2 puffs into the lungs 2 (two) times daily as needed for wheezing or shortness of breath.   2   No current facility-administered medications for this visit.     Allergies:   Latex; Lisinopril; Penicillins; Pravachol [pravastatin sodium]; Alendronate sodium; Codeine; Morphine and related; Prozac [fluoxetine hcl]; Symbicort [budesonide-formoterol fumarate]; and Wellbutrin [bupropion]   Social History:  The patient  reports that she has never smoked. She has never used smokeless tobacco. She reports that she does not drink alcohol or use drugs.   Family History:  The patient's family history is not on file.  ROS:  Please see the history of present illness.  Otherwise, review of systems is positive for poor appetite.  All other systems are reviewed and negative.   PHYSICAL EXAM: VS:  BP 118/64   Pulse 62   Ht 5\' 6"  (1.676 m)   Wt 179 lb 12.8 oz (81.6 kg)   BMI 29.02 kg/m  , BMI Body mass index is 29.02 kg/m. GEN: Well nourished, well developed, pleasant elderly woman in no acute distress  HEENT: normal  Neck: no JVD, no masses.  Cardiac: RRR with a grade 2/6 SEM at the RUSB, no diastolic murmur           Respiratory:  clear to auscultation bilaterally, normal work of breathing GI: soft, nontender, nondistended, + BS MS: no deformity or atrophy  Ext: no pretibial edema, pedal pulses 2+= bilaterally Skin: warm and dry, no rash Neuro:  Strength and sensation are intact Psych: euthymic mood, full affect  EKG:  EKG is ordered today. The ekg ordered today shows normal sinus rhythm 63 beats per minute, within normal limits  Recent Labs: 04/05/2017: ALT 14 04/09/2017: Magnesium 1.9 04/10/2017: BUN 10; Creatinine, Ser 0.85;  Potassium 3.9; Sodium 139 04/22/2017: Hemoglobin 9.4; Platelets 300   Lipid Panel  No results found for: CHOL, TRIG, HDL, CHOLHDL, VLDL, LDLCALC, LDLDIRECT    Wt Readings from Last 3 Encounters:  05/06/17 179 lb 12.8 oz (81.6 kg)  04/22/17 179 lb (81.2 kg)  04/11/17 195 lb 3.2 oz (88.5 kg)     ASSESSMENT AND PLAN: 1.  Aortic valve disease status post TAVR: The patient has had an uncomplicated postoperative course and is very well. She is tolerating aspirin and Plavix in combination. I personally reviewed her echo images today. The formal interpretation is pending. The patient has vigorous LV function, her transcatheter aortic valve appears to be functioning normally with peak and mean gradients of 34 and 17 mmHg, respectively. There is  trivial paravalvular regurgitation. She should follow-up in one year in valve clinic. She has a follow-up appointment with Dr. Wynonia Lawman scheduled next month.  2. Chronic diastolic heart failure, New York Heart Association functional class 1. The patient has had significant clinical improvement in both her energy level and shortness of breath. She will continue her current medical regimen.  3. Coronary artery disease, native vessel, without angina: Continue aspirin and Plavix  Current medicines are reviewed with the patient today.  The patient does not have concerns regarding medicines.  Labs/ tests ordered today include:   Orders Placed This Encounter  Procedures  . EKG 12-Lead    Disposition:   FU one year Valve Clinic, FU Dr Wynonia Lawman as scheduled  Signed, Sherren Mocha, MD  05/06/2017 1:44 PM    [facility], [place], [place]  [address] Phone: [phone]; Fax: [phone]

## 2017-05-06 ENCOUNTER — Ambulatory Visit (HOSPITAL_COMMUNITY): Payer: Medicare Other | Attending: Internal Medicine

## 2017-05-06 ENCOUNTER — Encounter: Payer: Self-pay | Admitting: Cardiovascular Disease

## 2017-05-06 ENCOUNTER — Encounter (INDEPENDENT_AMBULATORY_CARE_PROVIDER_SITE_OTHER): Payer: Self-pay

## 2017-05-06 ENCOUNTER — Ambulatory Visit (INDEPENDENT_AMBULATORY_CARE_PROVIDER_SITE_OTHER): Payer: Medicare Other | Admitting: Cardiovascular Disease

## 2017-05-06 ENCOUNTER — Other Ambulatory Visit: Payer: Self-pay

## 2017-05-06 VITALS — BP 118/64 | HR 62 | Ht 66.0 in | Wt 179.8 lb

## 2017-05-06 DIAGNOSIS — I352 Nonrheumatic aortic (valve) stenosis with insufficiency: Secondary | ICD-10-CM | POA: Diagnosis not present

## 2017-05-06 DIAGNOSIS — Z952 Presence of prosthetic heart valve: Secondary | ICD-10-CM | POA: Diagnosis not present

## 2017-05-06 DIAGNOSIS — I340 Nonrheumatic mitral (valve) insufficiency: Secondary | ICD-10-CM | POA: Diagnosis not present

## 2017-05-06 DIAGNOSIS — I350 Nonrheumatic aortic (valve) stenosis: Secondary | ICD-10-CM

## 2017-05-06 DIAGNOSIS — I361 Nonrheumatic tricuspid (valve) insufficiency: Secondary | ICD-10-CM | POA: Insufficient documentation

## 2017-05-06 DIAGNOSIS — E785 Hyperlipidemia, unspecified: Secondary | ICD-10-CM | POA: Diagnosis not present

## 2017-05-06 NOTE — Patient Instructions (Addendum)
Medication Instructions:  Your physician has recommended you make the following change in your medication:  1. DECREASE Iron supplement to once a day  Labwork: No new orders.   Testing/Procedures: Your physician has requested that you have an echocardiogram in 1 YEAR. Echocardiography is a painless test that uses sound waves to create images of your heart. It provides your doctor with information about the size and shape of your heart and how well your heart's chambers and valves are working. This procedure takes approximately one hour. There are no restrictions for this procedure.  Follow-Up: Continue routine cardiology follow-up with Dr Wynonia Lawman.   Your physician wants you to follow-up in: 1 YEAR with Dr Burt Knack. You will receive a reminder letter in the mail two months in advance. If you don't receive a letter, please call our office to schedule the follow-up appointment.   Any Other Special Instructions Will Be Listed Below (If Applicable).  Your physician discussed the importance of taking an antibiotic prior to any dental, gastrointestinal, genitourinary procedures to prevent damage to the heart valves from infection.   If you need a refill on your cardiac medications before your next appointment, please call your pharmacy.

## 2017-05-10 ENCOUNTER — Telehealth (HOSPITAL_COMMUNITY): Payer: Self-pay

## 2017-05-10 NOTE — Telephone Encounter (Signed)
I called patient to follow up on our previous conversation about cardiac rehab. Patient phone is continuously busy X2.

## 2017-05-30 ENCOUNTER — Encounter (HOSPITAL_COMMUNITY): Payer: Self-pay

## 2017-06-05 ENCOUNTER — Telehealth (HOSPITAL_COMMUNITY): Payer: Self-pay

## 2017-06-05 NOTE — Telephone Encounter (Signed)
Patient has been called 2X and letter sent about scheduling for cardiac rehab. Patient has not responded to phone calls and letter sent. Referral closed.

## 2017-06-07 DIAGNOSIS — R300 Dysuria: Secondary | ICD-10-CM | POA: Diagnosis not present

## 2017-06-07 DIAGNOSIS — Z1389 Encounter for screening for other disorder: Secondary | ICD-10-CM | POA: Diagnosis not present

## 2017-06-11 DIAGNOSIS — E785 Hyperlipidemia, unspecified: Secondary | ICD-10-CM | POA: Diagnosis not present

## 2017-06-11 DIAGNOSIS — I350 Nonrheumatic aortic (valve) stenosis: Secondary | ICD-10-CM | POA: Diagnosis not present

## 2017-06-11 DIAGNOSIS — Z952 Presence of prosthetic heart valve: Secondary | ICD-10-CM | POA: Diagnosis not present

## 2017-06-11 DIAGNOSIS — Z955 Presence of coronary angioplasty implant and graft: Secondary | ICD-10-CM | POA: Diagnosis not present

## 2017-07-31 DIAGNOSIS — N3090 Cystitis, unspecified without hematuria: Secondary | ICD-10-CM | POA: Diagnosis not present

## 2017-07-31 DIAGNOSIS — R300 Dysuria: Secondary | ICD-10-CM | POA: Diagnosis not present

## 2017-08-12 DIAGNOSIS — N3090 Cystitis, unspecified without hematuria: Secondary | ICD-10-CM | POA: Diagnosis not present

## 2017-08-12 DIAGNOSIS — R300 Dysuria: Secondary | ICD-10-CM | POA: Diagnosis not present

## 2017-09-03 DIAGNOSIS — E785 Hyperlipidemia, unspecified: Secondary | ICD-10-CM | POA: Diagnosis not present

## 2017-09-03 DIAGNOSIS — Z0000 Encounter for general adult medical examination without abnormal findings: Secondary | ICD-10-CM | POA: Diagnosis not present

## 2017-09-03 DIAGNOSIS — I10 Essential (primary) hypertension: Secondary | ICD-10-CM | POA: Diagnosis not present

## 2017-09-03 DIAGNOSIS — I2510 Atherosclerotic heart disease of native coronary artery without angina pectoris: Secondary | ICD-10-CM | POA: Diagnosis not present

## 2017-09-18 ENCOUNTER — Other Ambulatory Visit: Payer: Self-pay

## 2017-09-18 ENCOUNTER — Other Ambulatory Visit: Payer: Self-pay | Admitting: Cardiology

## 2017-09-18 MED ORDER — METOPROLOL TARTRATE 25 MG PO TABS: 12.5000 mg | ORAL_TABLET | Freq: Two times a day (BID) | ORAL | 2 refills | Status: DC

## 2017-10-18 ENCOUNTER — Other Ambulatory Visit: Payer: Self-pay | Admitting: Cardiology

## 2017-11-22 IMAGING — CR DG HAND 2V*L*
2 series · 2 of 2 positions shown · non-contrast
Comparison: None.

CLINICAL DATA: C/o pain along index finger x 2 weeks / worse at MP
jt with erythema and MARCIO / no trauma / pt concern for OA

EXAM:
LEFT HAND - 2 VIEW

[x hand pa left]
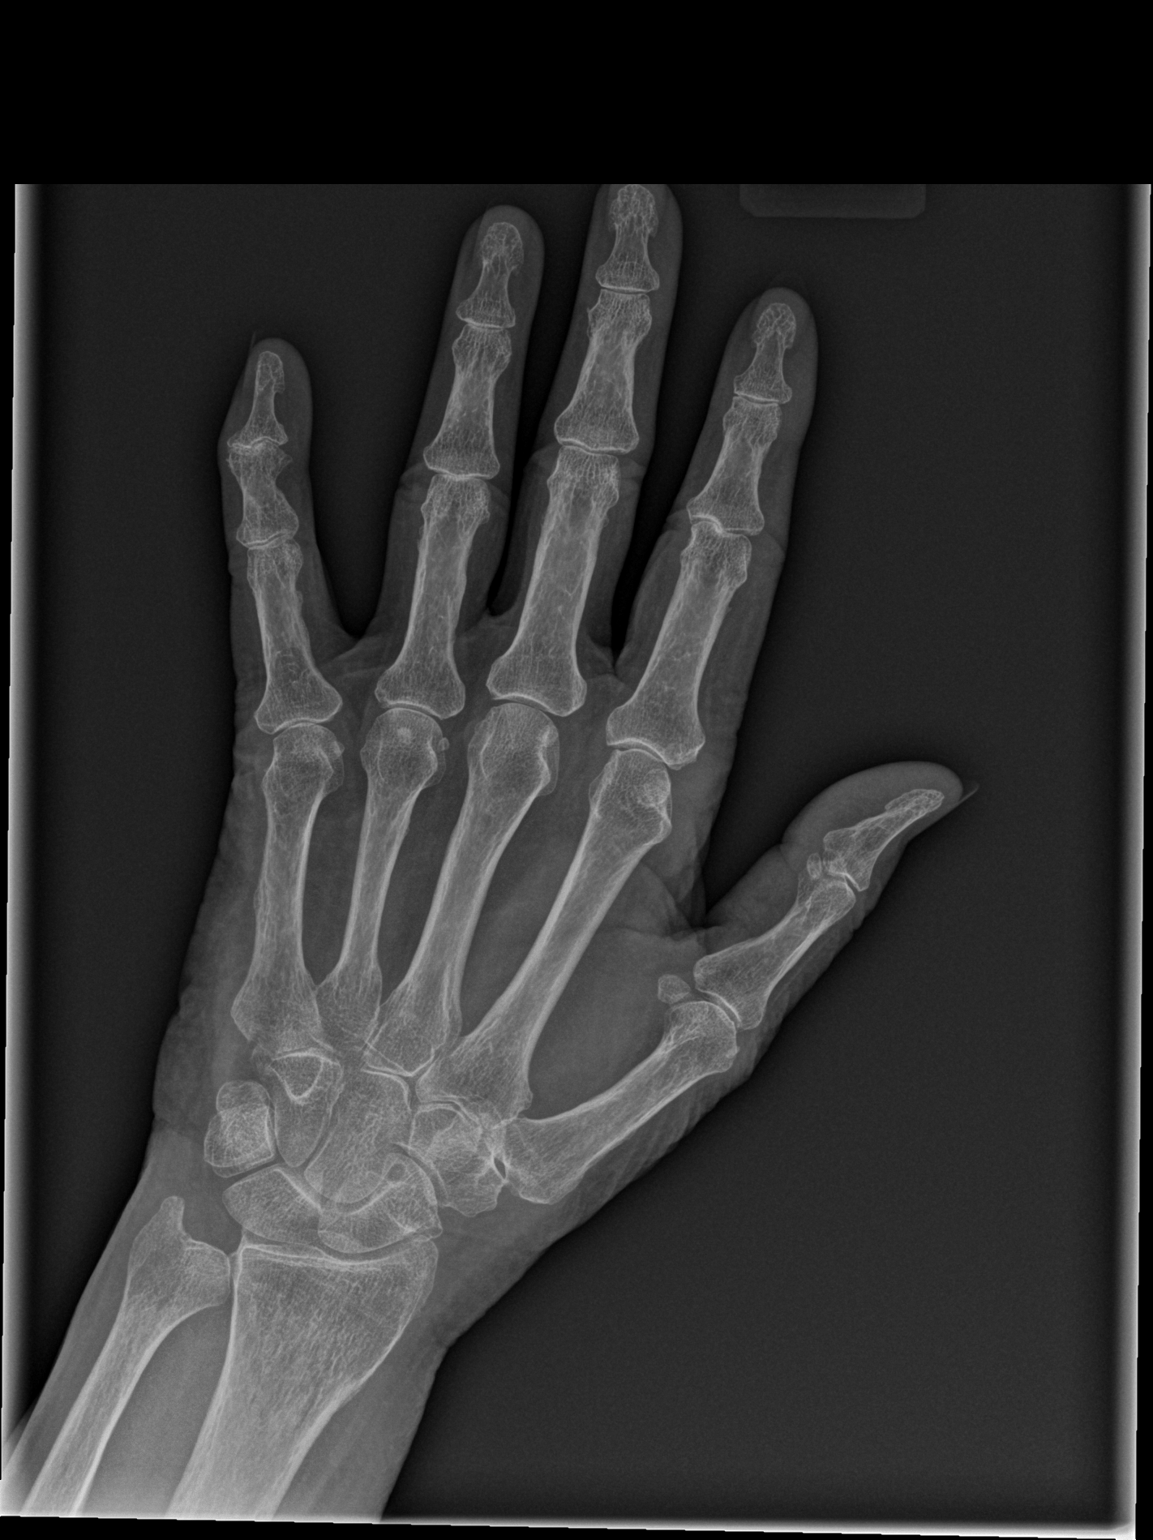

[x hand lat left]
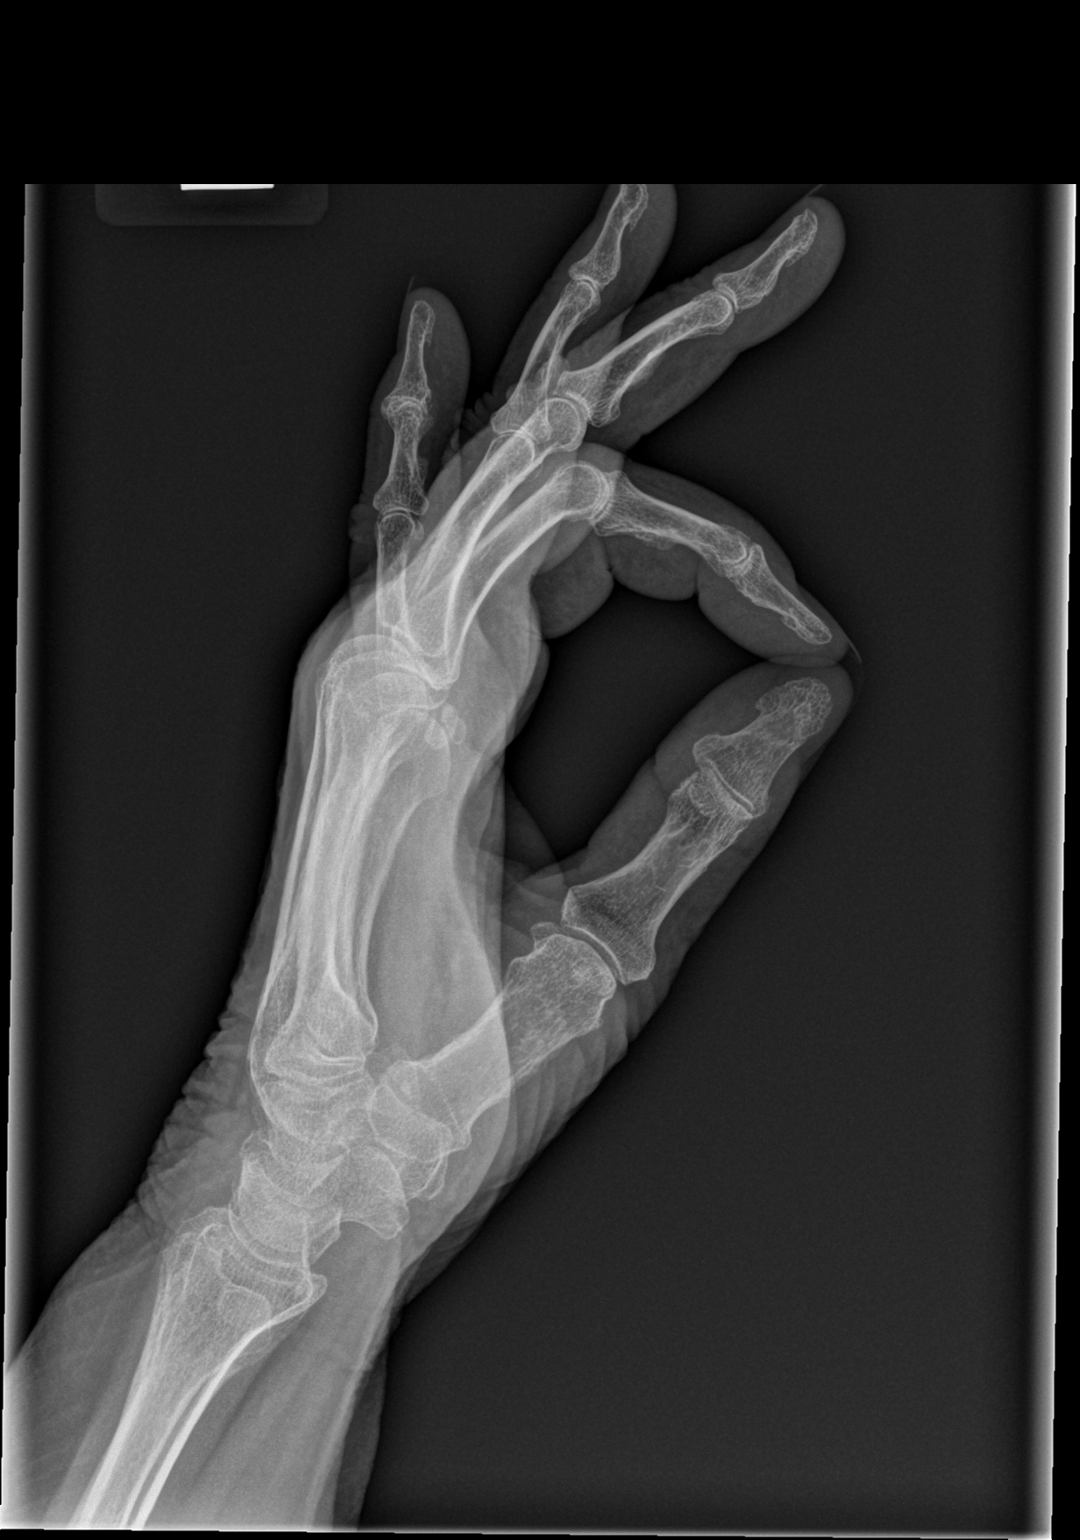

[2 of 2 positions shown; findings below may reference images not displayed]

FINDINGS: No fracture or dislocation.

There is asymmetric joint space narrowing involving multiple
interphalangeal joints, most notably the DIP joint of the fifth
finger. There is mild associated subchondral sclerosis at this joint
with marginal osteophytes.

There is mild asymmetric joint space narrowing of the second, fourth
and fifth metacarpal phalangeal joints. No joint subluxation. No
erosions.

Bones are diffusely demineralized.

Soft tissues are unremarkable.
IMPRESSION: 1. No fracture, dislocation or acute finding.
2. Arthropathic changes predominantly involving the interphalangeal
joints consistent with osteoarthritis.

## 2017-12-06 DIAGNOSIS — J45909 Unspecified asthma, uncomplicated: Secondary | ICD-10-CM | POA: Diagnosis not present

## 2017-12-06 IMAGING — CR DG CHEST 2V
2 series · 2 of 2 positions shown · non-contrast
Comparison: CT 02/26/2017. Chest x-ray 04/27/2016. Chest x-ray
05/18/2015.

CLINICAL DATA: Aortic stenosis.

EXAM:
CHEST  2 VIEW

[w chest pa]
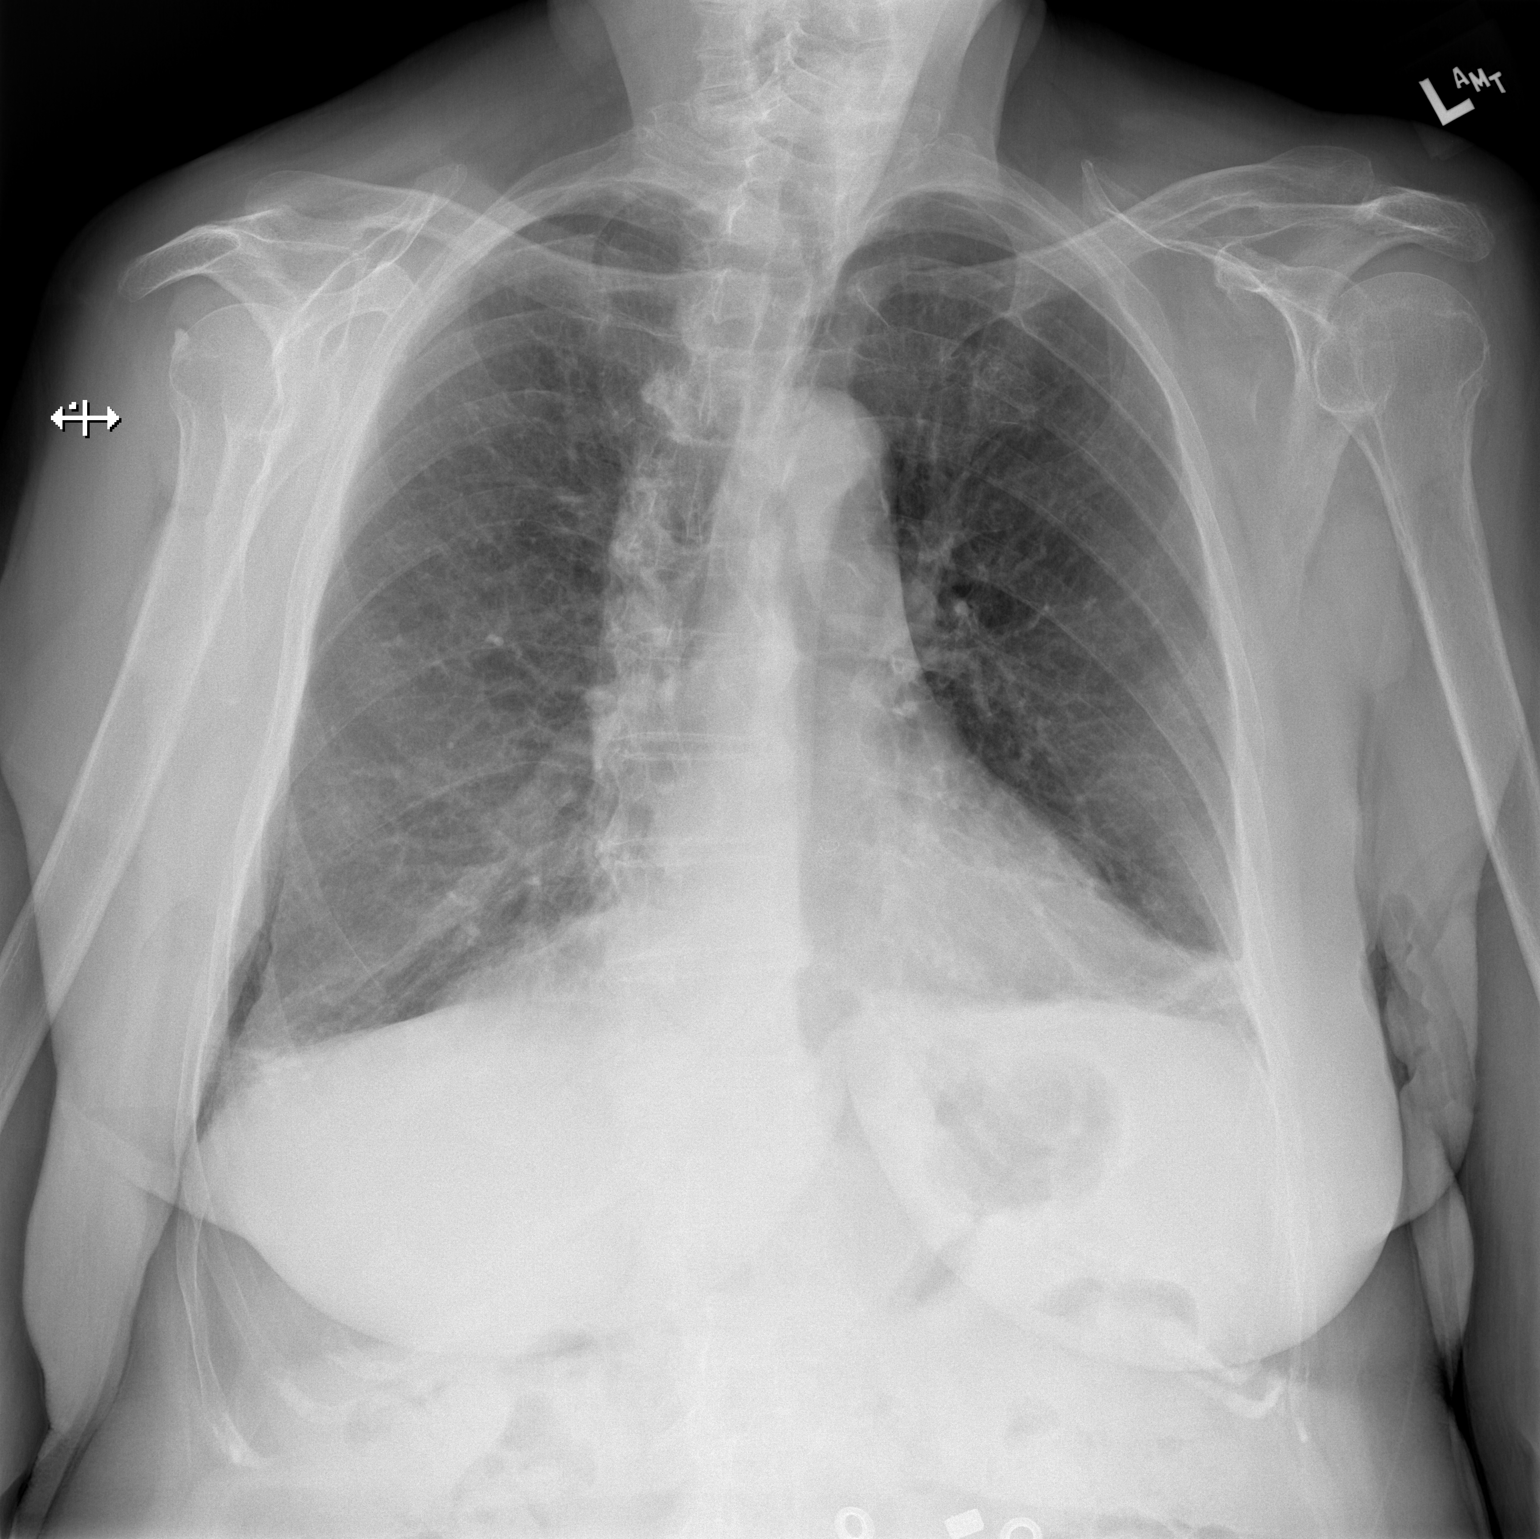

[w chest lat]
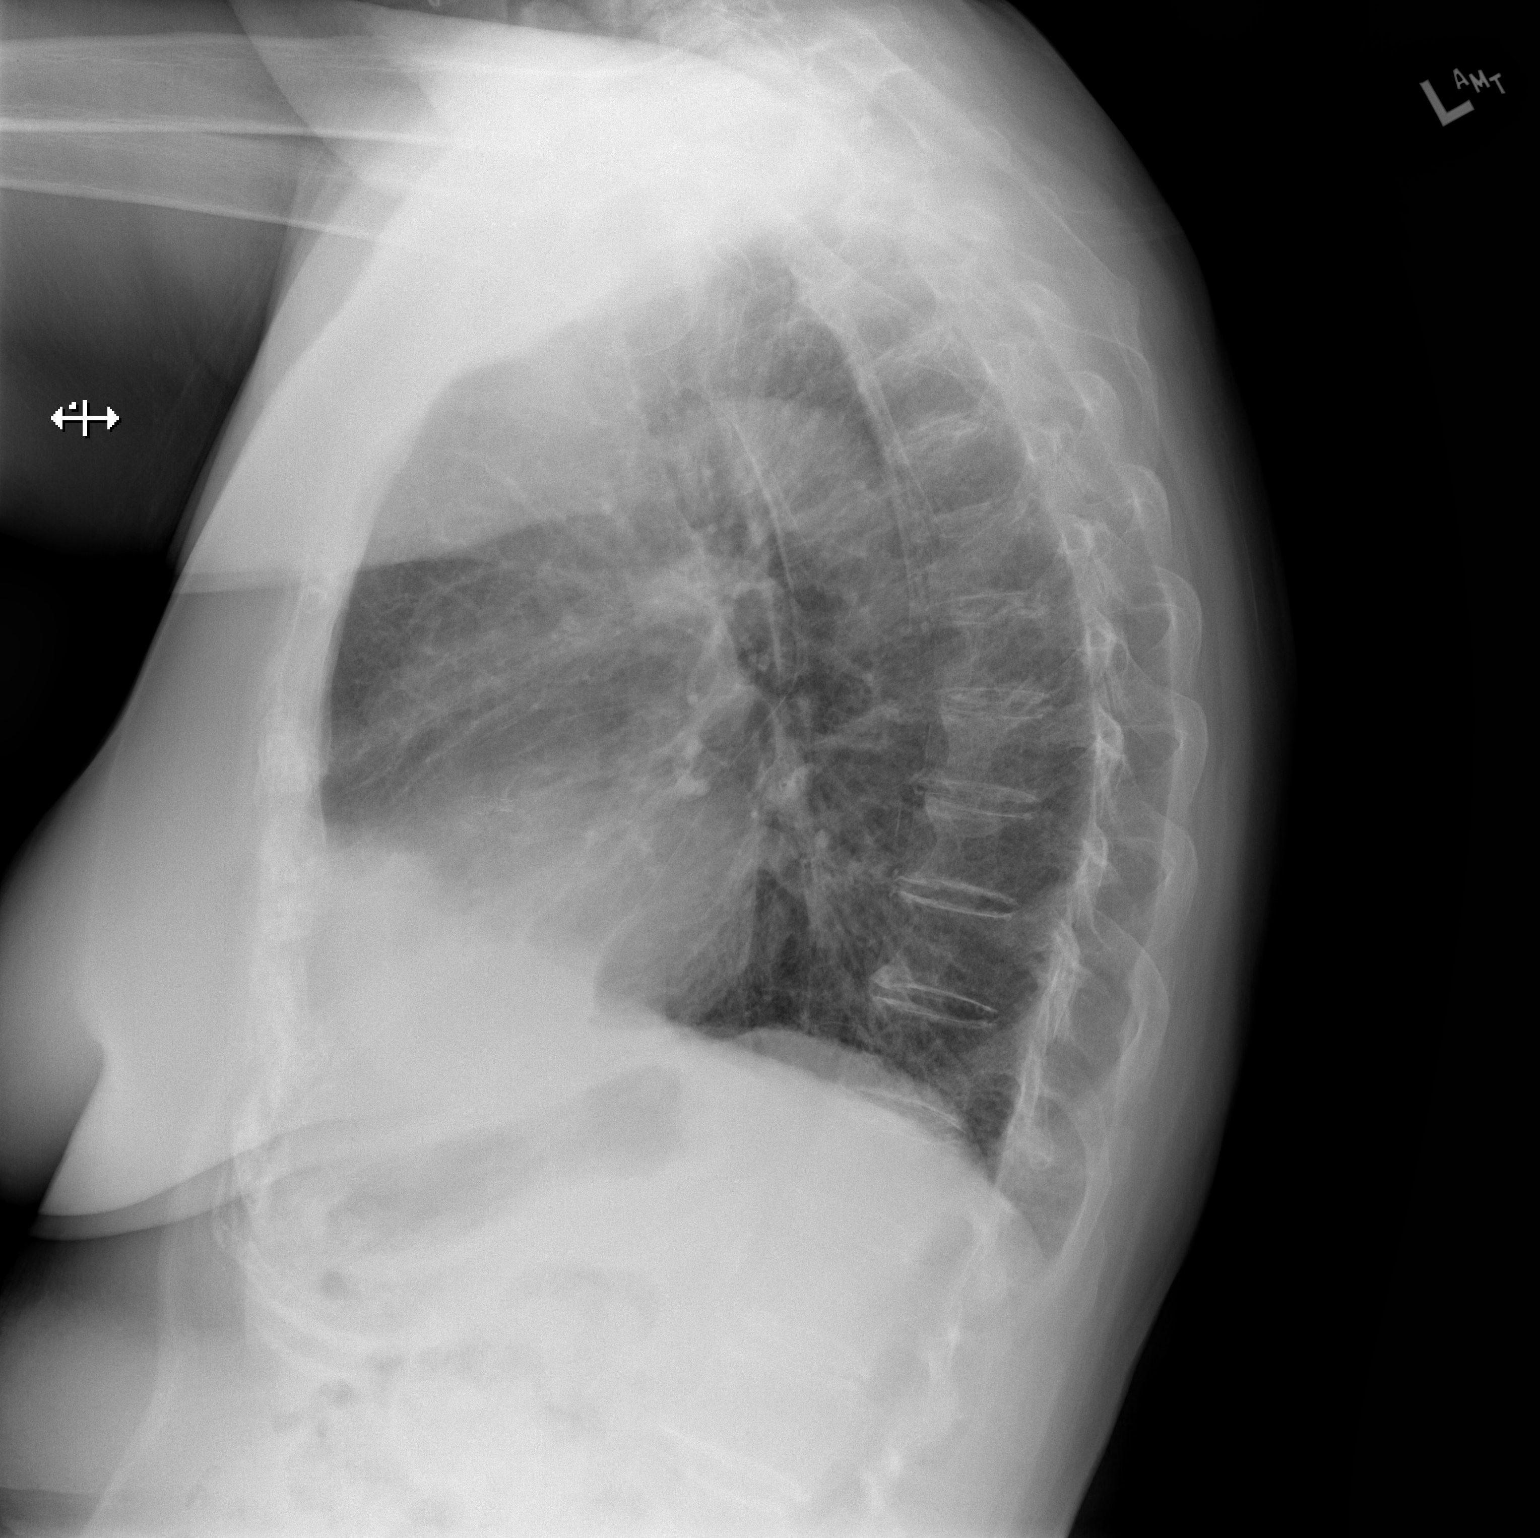

[2 of 2 positions shown; findings below may reference images not displayed]

FINDINGS: Mediastinum and hilar structures normal. Low lung volumes with mild
basilar atelectasis. Heart size normal. No pleural effusion or
pneumothorax. Degenerative changes thoracic spine with thoracic
spine scoliosis concave left. Calcific density noted over the right
shoulder consistent with calcific supraspinatus tendinosis.
IMPRESSION: Low lung volumes with mild basilar atelectasis and/or scarring.

## 2017-12-11 IMAGING — CR DG CHEST 1V PORT
1 series · 1 of 1 positions shown · non-contrast
Comparison: 04/09/2017 .

CLINICAL DATA: Shortness of breath.

EXAM:
PORTABLE CHEST 1 VIEW

[AP]
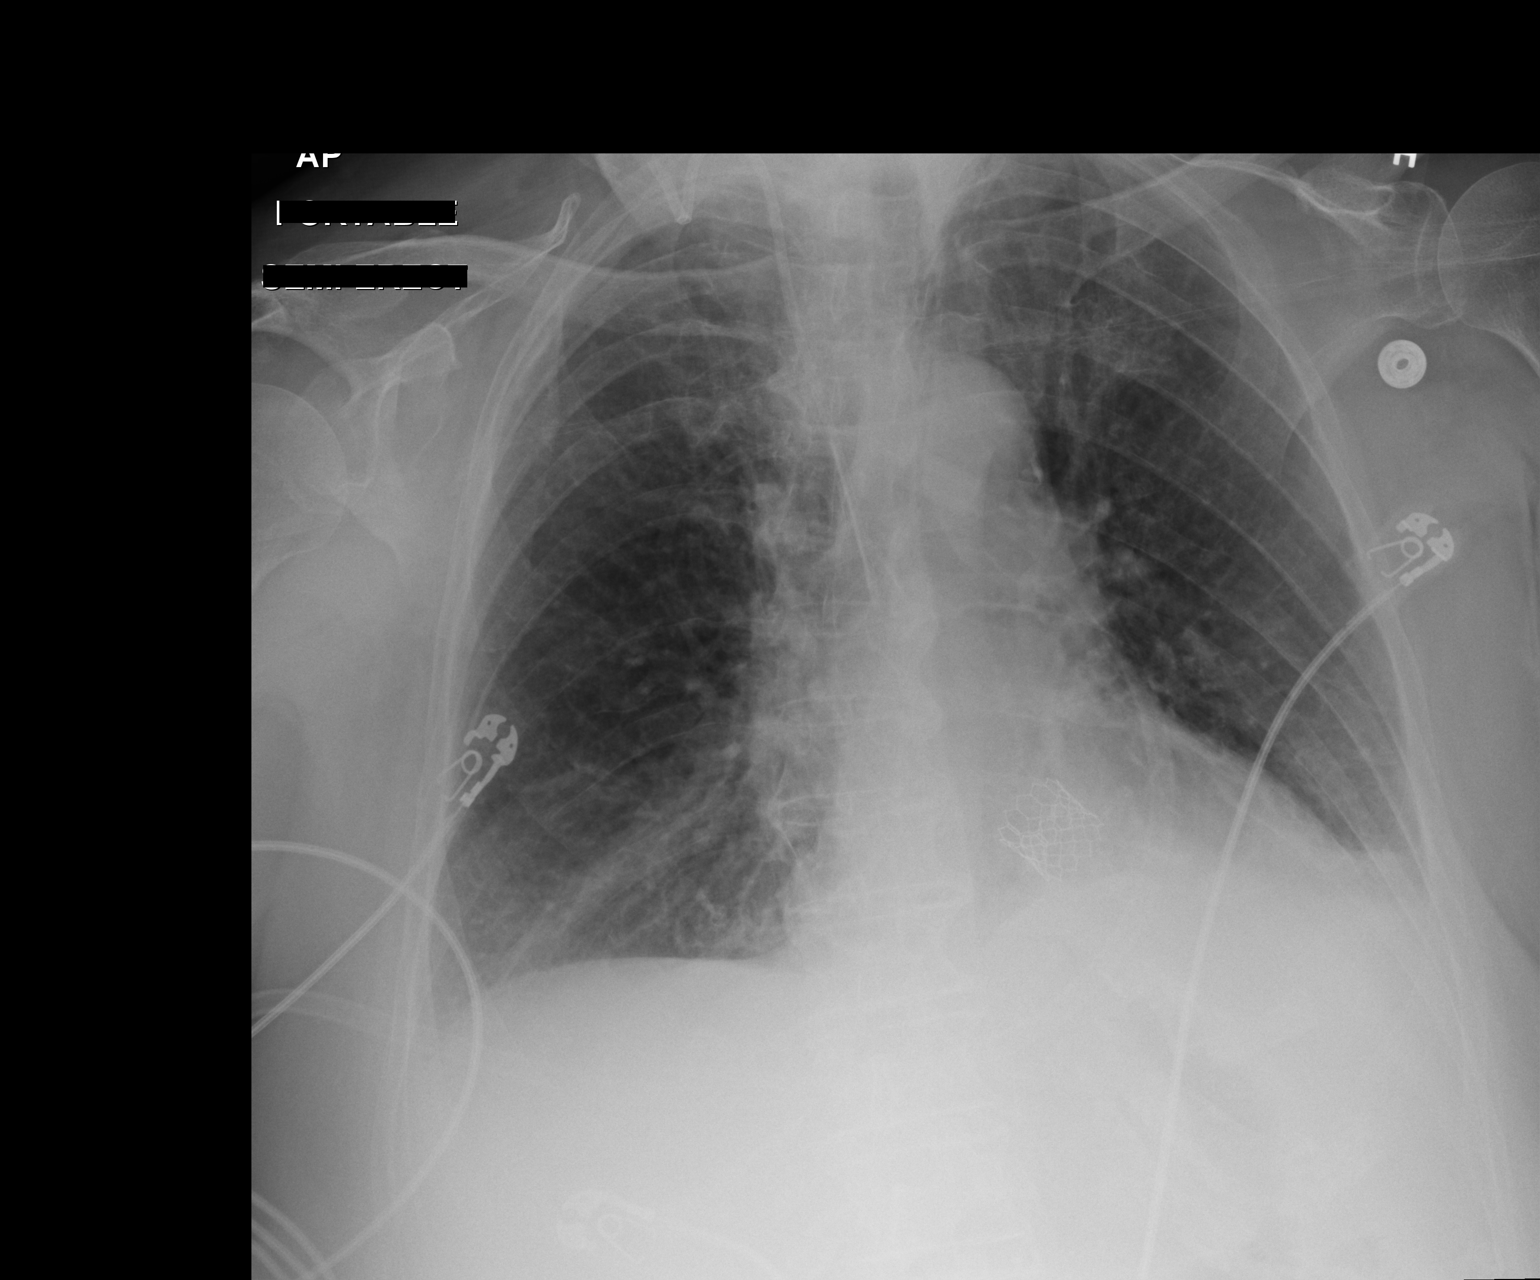

[1 of 1 positions shown; findings below may reference images not displayed]

FINDINGS: Right IJ line stable position. Prior cardiac valve replacement.
Cardiomegaly with normal pulmonary vascularity. No focal infiltrate.
Mild basilar atelectasis. Small left pleural effusion .
IMPRESSION: 1. Right IJ line and stable position.

2. Prior cardiac valve replacement. Heart size stable. Interim near
complete clearing of pulmonary interstitial edema. Persistent small
bilateral pleural effusions.

3. Low lung volumes with mild basilar atelectasis.

## 2017-12-31 DIAGNOSIS — I350 Nonrheumatic aortic (valve) stenosis: Secondary | ICD-10-CM | POA: Diagnosis not present

## 2017-12-31 DIAGNOSIS — Z952 Presence of prosthetic heart valve: Secondary | ICD-10-CM | POA: Diagnosis not present

## 2017-12-31 DIAGNOSIS — Z955 Presence of coronary angioplasty implant and graft: Secondary | ICD-10-CM | POA: Diagnosis not present

## 2017-12-31 DIAGNOSIS — I2510 Atherosclerotic heart disease of native coronary artery without angina pectoris: Secondary | ICD-10-CM | POA: Diagnosis not present

## 2017-12-31 DIAGNOSIS — E7849 Other hyperlipidemia: Secondary | ICD-10-CM | POA: Diagnosis not present

## 2018-02-28 ENCOUNTER — Other Ambulatory Visit: Payer: Self-pay | Admitting: Cardiovascular Disease

## 2018-02-28 MED ORDER — CLOPIDOGREL BISULFATE 75 MG PO TABS: 75.0000 mg | ORAL_TABLET | Freq: Every day | ORAL | 1 refills | Status: DC

## 2018-03-04 DIAGNOSIS — E785 Hyperlipidemia, unspecified: Secondary | ICD-10-CM | POA: Diagnosis not present

## 2018-03-04 DIAGNOSIS — D509 Iron deficiency anemia, unspecified: Secondary | ICD-10-CM | POA: Diagnosis not present

## 2018-03-04 DIAGNOSIS — I10 Essential (primary) hypertension: Secondary | ICD-10-CM | POA: Diagnosis not present

## 2018-03-04 DIAGNOSIS — I2510 Atherosclerotic heart disease of native coronary artery without angina pectoris: Secondary | ICD-10-CM | POA: Diagnosis not present

## 2018-03-05 ENCOUNTER — Telehealth: Payer: Self-pay

## 2018-03-05 NOTE — Telephone Encounter (Addendum)
TAVR completed 04/09/2017.  Called to arrange 1 year TAVR echo and visit with K. Grandville Silos, [place].  Attempted to call patient. Hung up after several rings as VM did not pick up.  Will try again later.

## 2018-03-06 DIAGNOSIS — E559 Vitamin D deficiency, unspecified: Secondary | ICD-10-CM | POA: Diagnosis not present

## 2018-03-06 DIAGNOSIS — D509 Iron deficiency anemia, unspecified: Secondary | ICD-10-CM | POA: Diagnosis not present

## 2018-03-06 DIAGNOSIS — E785 Hyperlipidemia, unspecified: Secondary | ICD-10-CM | POA: Diagnosis not present

## 2018-03-07 NOTE — Telephone Encounter (Signed)
Patient's transportation states they have had a loss in the family and will call back to arrange appointment when morale is better.

## 2018-03-21 DIAGNOSIS — N3000 Acute cystitis without hematuria: Secondary | ICD-10-CM | POA: Diagnosis not present

## 2018-04-02 NOTE — Telephone Encounter (Signed)
Attempted to call patient - no answer after several rings and no VM picked up.  Will try again later.

## 2018-04-08 DIAGNOSIS — F4321 Adjustment disorder with depressed mood: Secondary | ICD-10-CM | POA: Diagnosis not present

## 2018-04-08 DIAGNOSIS — R300 Dysuria: Secondary | ICD-10-CM | POA: Diagnosis not present

## 2018-04-08 DIAGNOSIS — N3090 Cystitis, unspecified without hematuria: Secondary | ICD-10-CM | POA: Diagnosis not present

## 2018-04-09 NOTE — Telephone Encounter (Signed)
The patient reports she has an echo scheduled at Dr. Thurman Coyer office 6/17. Scheduled her for 1 year TAVR visit with Dr. Burt Knack 6/24.

## 2018-04-21 DIAGNOSIS — Z955 Presence of coronary angioplasty implant and graft: Secondary | ICD-10-CM | POA: Diagnosis not present

## 2018-04-21 DIAGNOSIS — I2510 Atherosclerotic heart disease of native coronary artery without angina pectoris: Secondary | ICD-10-CM | POA: Diagnosis not present

## 2018-04-21 DIAGNOSIS — I359 Nonrheumatic aortic valve disorder, unspecified: Secondary | ICD-10-CM | POA: Diagnosis not present

## 2018-04-21 DIAGNOSIS — Z952 Presence of prosthetic heart valve: Secondary | ICD-10-CM | POA: Diagnosis not present

## 2018-04-21 DIAGNOSIS — I350 Nonrheumatic aortic (valve) stenosis: Secondary | ICD-10-CM | POA: Diagnosis not present

## 2018-04-28 ENCOUNTER — Ambulatory Visit: Payer: Medicare Other | Admitting: Cardiovascular Disease

## 2018-04-28 ENCOUNTER — Encounter: Payer: Self-pay | Admitting: Cardiovascular Disease

## 2018-04-28 ENCOUNTER — Encounter (INDEPENDENT_AMBULATORY_CARE_PROVIDER_SITE_OTHER): Payer: Self-pay

## 2018-04-28 VITALS — BP 128/76 | HR 70 | Ht 66.0 in | Wt 176.0 lb

## 2018-04-28 DIAGNOSIS — I2510 Atherosclerotic heart disease of native coronary artery without angina pectoris: Secondary | ICD-10-CM

## 2018-04-28 DIAGNOSIS — I350 Nonrheumatic aortic (valve) stenosis: Secondary | ICD-10-CM

## 2018-04-28 NOTE — Progress Notes (Signed)
Cardiology Office Note Date:  04/28/2018   ID:  YILIN WEEDON, DOB [birth year], MRN [id number]  PCP:  Harlan Stains, MD  Cardiologist:  Sherren Mocha, MD    Chief Complaint  Patient presents with  . Shortness of Breath     History of Present Illness: Amy Kaiser is a 84 y.o. female who presents for one year TAVR follow-up. The patient has a history of severe aortic stenosis with progressive symptoms of diastolic heart failure and she underwent TAVR with a 20 mm Sapien 3 valve via a percutaneous transfemoral approach 04/09/2017. During her preoperative evaluation she was found to have severe stenosis of the proximal RCA and she underwent stenting with a 4 mm drug-eluting stent 03/11/2017. The patient's postoperative course was uncomplicated.  She is here with her daughter today.  She is been doing very well.  Her breathing has improved dramatically since undergoing TAVR.  She has been able to get out of work in her garden.  She denies chest pain, shortness of breath, or leg swelling.   Past Medical History:  Diagnosis Date  . Anemia   . Anxiety   . Arthritis    "just about all over" (03/11/2017)  . Asthma   . Cervical cancer ([place])   . COPD (chronic obstructive pulmonary disease) ([place])   . Coronary artery disease   . Depression   . Diabetes mellitus without complication ([place])    pt denies this hx on 03/11/2017, patient denies this states she has low blood sugar  . Heart murmur   . Hyperlipidemia   . Low blood sugar   . Migraine    "work related; quit when I quit work in ~ 1997" (03/11/2017)  . Skin cancer    back    Past Surgical History:  Procedure Laterality Date  . APPENDECTOMY    . CARDIAC CATHETERIZATION  02/2017  . CATARACT EXTRACTION W/ INTRAOCULAR LENS  IMPLANT, BILATERAL Bilateral   . CORONARY ANGIOPLASTY WITH STENT PLACEMENT  03/11/2017  . CORONARY STENT INTERVENTION N/A 03/11/2017   Procedure: Coronary Stent Intervention;  Surgeon: Sherren Mocha, MD;   Location: [facility];  Service: Cardiovascular;  Laterality: N/A;  . IR RADIOLOGY PERIPHERAL GUIDED IV START  02/26/2017  . IR US GUIDE VASC ACCESS RIGHT  02/26/2017  . RIGHT/LEFT HEART CATH AND CORONARY ANGIOGRAPHY N/A 02/20/2017   Procedure: Right/Left Heart Cath and Coronary Angiography;  Surgeon: Sherren Mocha, MD;  Location: [facility];  Service: Cardiovascular;  Laterality: N/A;  . SKIN CANCER EXCISION     "back"  . TEE WITHOUT CARDIOVERSION N/A 04/09/2017   Procedure: TRANSESOPHAGEAL ECHOCARDIOGRAM (TEE);  Surgeon: Sherren Mocha, MD;  Location: [facility];  Service: Open Heart Surgery;  Laterality: N/A;  . TRANSCATHETER AORTIC VALVE REPLACEMENT, TRANSFEMORAL N/A 04/09/2017   Procedure: TRANSCATHETER AORTIC VALVE REPLACEMENT, TRANSFEMORAL;  Surgeon: Sherren Mocha, MD;  Location: [facility];  Service: Open Heart Surgery;  Laterality: N/A;  . VAGINAL HYSTERECTOMY      Current Outpatient Medications  Medication Sig Dispense Refill  . aspirin EC 81 MG tablet Take 81 mg by mouth daily.    . calcium-vitamin D (OSCAL WITH D) 500-200 MG-UNIT tablet Take 1 tablet by mouth 2 (two) times daily.    . Cholecalciferol (VITAMIN D3) 1000 units CAPS Take 1,000 Units by mouth daily with lunch.     Cristy Friedlander HFA 220 MCG/ACT inhaler Take 1 puff by mouth daily as needed (wheezing).   12  . loperamide (IMODIUM A-D)  2 MG tablet Take 2 mg by mouth as needed for diarrhea or loose stools.    . metoprolol tartrate (LOPRESSOR) 25 MG tablet Take 0.5 tablets (12.5 mg total) 2 (two) times daily by mouth. 180 tablet 2  . Multiple Vitamins-Minerals (ICAPS AREDS 2 PO) Take 1 capsule by mouth 2 (two) times daily.    . nitroGLYCERIN (NITROSTAT) 0.4 MG SL tablet Place 0.4 mg under the tongue every 5 (five) minutes as needed for chest pain.    . rosuvastatin (CRESTOR) 10 MG tablet Take 10 mg by mouth daily with supper.     . VENTOLIN HFA 108 (90 BASE) MCG/ACT inhaler Inhale 2 puffs into the lungs 2 (two) times daily as  needed for wheezing or shortness of breath.   2   No current facility-administered medications for this visit.     Allergies:   Latex; Lisinopril; Penicillins; Pravachol [pravastatin sodium]; Alendronate sodium; Codeine; Morphine and related; Prozac [fluoxetine hcl]; Symbicort [budesonide-formoterol fumarate]; and Wellbutrin [bupropion]   Social History:  The patient  reports that she has never smoked. She has never used smokeless tobacco. She reports that she does not drink alcohol or use drugs.   Family History:  The patient's  family history is not on file.    ROS:  Please see the history of present illness.  All other systems are reviewed and negative.    PHYSICAL EXAM: VS:  BP 128/76   Pulse 70   Ht 5\' 6"  (1.676 m)   Wt 176 lb (79.8 kg)   SpO2 96%   BMI 28.41 kg/m  , BMI Body mass index is 28.41 kg/m. GEN: Well nourished, well developed, in no acute distress  HEENT: normal  Neck: no JVD, no masses. Cardiac: RRR with a 2/6 SEM at the RUSB                Respiratory:  clear to auscultation bilaterally, normal work of breathing GI: soft, nontender, nondistended, + BS MS: no deformity or atrophy  Ext: no pretibial edema Skin: warm and dry, no rash Neuro:  Strength and sensation are intact Psych: euthymic mood, full affect  EKG:  EKG is not ordered today.  Recent Labs: No results found for requested labs within last 8760 hours.   Lipid Panel  No results found for: CHOL, TRIG, HDL, CHOLHDL, VLDL, LDLCALC, LDLDIRECT    Wt Readings from Last 3 Encounters:  04/28/18 176 lb (79.8 kg)  05/06/17 179 lb 12.8 oz (81.6 kg)  04/22/17 179 lb (81.2 kg)     ASSESSMENT AND PLAN: 1.  Aortic valve disease status post TAVR: New York Heart Association functional class I symptoms.  She will continue on antiplatelet therapy with aspirin.  She is reminded of the need for SBE prophylaxis.  Her 30-day echo demonstrated normal function of her transcatheter heart valve with a mean  transvalvular gradient of 14 mmHg.  She had her one-year echo study at Dr. Thurman Coyer office and we will send for a copy of the report.  She was treated with a small prosthesis (20 mm S3).  2.  Coronary artery disease, native vessel, without angina: She is now greater than 12 months out from PCI and will discontinue clopidogrel per guideline recommendations.  Plan discussed with Dr Wynonia Lawman  Current medicines are reviewed with the patient today.  The patient does not have concerns regarding medicines.  Labs/ tests ordered today include:  No orders of the defined types were placed in this encounter.  Disposition:  Stop plavix Stop iron (last HgB 11 mg/dL) FU Dr Wynonia Lawman as planned. Happy to see back if problems arise.   Signed, Sherren Mocha, MD  04/28/2018 1:37 PM    [facility] [address], [place], [place]  [address] Phone: [phone]; Fax: [phone]

## 2018-04-28 NOTE — Patient Instructions (Addendum)
Medication Instructions:  1) STOP PLAVIX 2) STOP IRON  Your physician discussed the importance of taking an antibiotic prior to any dental, gastrointestinal, genitourinary procedures to prevent damage to the heart valves from infection. You will need to take antibiotics prior to any dental visits.  Labwork: None  Testing/Procedures: None  Follow-Up: Your provider recommends that you schedule a follow-up appointment AS NEEDED with Dr. Burt Knack!  Any Other Special Instructions Will Be Listed Below (If Applicable).     If you need a refill on your cardiac medications before your next appointment, please call your pharmacy.

## 2018-04-30 ENCOUNTER — Other Ambulatory Visit: Payer: Self-pay

## 2018-04-30 MED ORDER — NITROGLYCERIN 0.4 MG SL SUBL: 0.4000 mg | SUBLINGUAL_TABLET | SUBLINGUAL | 1 refills | Status: DC | PRN

## 2018-06-09 DIAGNOSIS — R300 Dysuria: Secondary | ICD-10-CM | POA: Diagnosis not present

## 2018-06-18 ENCOUNTER — Encounter: Payer: Self-pay | Admitting: Thoracic Surgery (Cardiothoracic Vascular Surgery)

## 2018-06-30 ENCOUNTER — Ambulatory Visit: Payer: Self-pay | Admitting: Cardiovascular Disease

## 2018-09-05 DIAGNOSIS — Z0000 Encounter for general adult medical examination without abnormal findings: Secondary | ICD-10-CM | POA: Diagnosis not present

## 2018-09-05 DIAGNOSIS — Z23 Encounter for immunization: Secondary | ICD-10-CM | POA: Diagnosis not present

## 2018-09-05 DIAGNOSIS — Z1389 Encounter for screening for other disorder: Secondary | ICD-10-CM | POA: Diagnosis not present

## 2018-09-05 DIAGNOSIS — R8290 Unspecified abnormal findings in urine: Secondary | ICD-10-CM | POA: Diagnosis not present

## 2018-09-05 DIAGNOSIS — I10 Essential (primary) hypertension: Secondary | ICD-10-CM | POA: Diagnosis not present

## 2018-09-23 ENCOUNTER — Ambulatory Visit: Payer: Medicare Other | Admitting: Urology

## 2018-09-23 ENCOUNTER — Encounter: Payer: Self-pay | Admitting: Urology

## 2018-09-23 VITALS — BP 149/80 | HR 65 | Ht 66.0 in | Wt 175.5 lb

## 2018-09-23 DIAGNOSIS — N390 Urinary tract infection, site not specified: Secondary | ICD-10-CM | POA: Diagnosis not present

## 2018-09-23 DIAGNOSIS — N952 Postmenopausal atrophic vaginitis: Secondary | ICD-10-CM

## 2018-09-23 LAB — BLADDER SCAN AMB NON-IMAGING

## 2018-09-23 MED ORDER — ESTRADIOL 0.1 MG/GM VA CREA: TOPICAL_CREAM | VAGINAL | 12 refills | Status: DC

## 2018-09-23 MED ORDER — ESTROGENS, CONJUGATED 0.625 MG/GM VA CREA: TOPICAL_CREAM | VAGINAL | 12 refills | Status: DC

## 2018-09-23 NOTE — Patient Instructions (Addendum)
Urinary Tract Infection Prevention Patient Education  Stay Hydrated: Urinary tract infections (UTIs) are less likely to occur in someone who is drinking enough water to promote regular urination, so it is very important to stay hydrated in order to help flush out bacteria from the urinary tract.  Respond to Natures Call: It is always a good idea to urinate as soon as you feel the need. While holding it in does not directly cause an infection, it can cause overdistension that can damage the lining of the bladder, making it more vulnerable to bacteria.  Remove Tampons Before Going: Remember to always take out tampons before urinating, and change tampons often.   Practice Proper Bathroom Hygiene: To keep bacteria near the urethral opening to a minimum, it is important to practice proper wiping techniques (i.e. front to back wiping) to help prevent rectal bacteria from entering the uretro-genital area. It can also be helpful to take showers and avoid soaking in the bathtub.   Take a Vitamin C Supplement: About 1,000 milligrams of vitamin C taken daily can help inhibit the growth of some bacteria by acidifying the urine.  Maintain Control with Cranberries: Cranberries contain hippuronic acid, which is a natural antiseptic that may help prevent the adherence of bacteria to the bladder lining. Drinking 100% pure cranberry juice or taking over the counter cranberry supplements twice daily may help to prevent an infection. However, it is important to note that cranberry juices/supplements are not helpful once a urinary tract infection (UTI) is present.  Strengthen Your Core: Often, a lazy bladder (unable to empty urine properly) occurs due to lower back problem, so consider doing exercises to help strengthen your back, pelvic floor, and stomach muscles.   Pay Attention to Your Urine: Your urine can change color for a variety of reasons, including from the  medications you take, so pay close attention to it to monitor your overall health. One key thing to note is that if your urine is typically a darker yellow, your body is dehydrated, so you need to step up your water intake.    It is extremely important that if you feel you are experiencing the symptoms of an urinary tract infection to contact our office so that we may obtain a catheterized specimen to insure accuracy for the diagnosis of an infection.  If your symptoms are occurring after hours and you are being seen in the emergency room or urgent care facility or another provider's office, please ask them to catheterize you for a urine culture.  I have given you two prescriptions for a vaginal estrogen cream.  Estrace and Premarin.  Please take these to your pharmacy and see which one your insurance covers.  If both are too expensive, please call the office at [phone] for an alternative.  You are given a sample of vaginal estrogen cream Premarin and instructed to apply 0.5mg  (pea-sized amount)  just inside the vaginal introitus with a finger-tip on Monday, Wednesday and Friday nights,

## 2018-09-23 NOTE — Progress Notes (Signed)
10/03/2018 2:15 PM   Amy Kaiser 13-Nov-1928 [id number]  Referring provider: Harlan Stains, MD [place] [place], [place] [address]  Chief Complaint  Patient presents with  . Establish Care    was placed on antibiotics that cleared her sxs  . Recurrent UTI    HPI: Patient is a 84 -year-old Caucasian female who is referred to [place] by Dr. Harlan Stains for recurrent urinary tract infection with her granddaughter, Amy Kaiser.   Patient states that she has had several urinary tract infections over the last year.  Reviewing her records,  she has had one documented positive urine culture. + E.Coli on 06/09/2018 - treated with Bactrim x 20 days    Her symptoms with a urinary tract infection consist of dysuria, chills with voiding and suprapubic pain.  She denies gross hematuria, back pain, abdominal pain or flank pain associated with UTI's.  She has not had any recent fevers, chills, nausea or vomiting associated with UTI's.   She does not have a history of nephrolithiasis, GU surgery or GU trauma.   She is not sexually active.     She is postmenopausal.   She admits to constipation and/or diarrhea.   She does not engage in good perineal hygiene. She does not take tub baths.    She does not have incontinence.     CT angio abd/pelvis w and w/o on 02/26/2017 revealed multiple tiny 2-3 mm low-attenuation lesions in both kidneys are too small to characterize, but are statistically likely to represent tiny cysts. Bilateral adrenal glands are normal in appearance. No hydroureteronephrosis. Urinary bladder is normal in appearance.  She reports that she "forgets" to drink water and attributes that to her hx of UTIs.  She occasionally drinks water and orange/pineapple juice, a cup of coffee daily and sweet tea occasionally. She drinks soda (Sprite) frequently. No alcohol or energy drinks.   She denies hematuria, fever, nausea, vomiting, lower back pain, flank pain, and  any other symptoms.  She denies UTI symptoms today and report clear urine.  Her PVR is 52 mL.    PMH: Past Medical History:  Diagnosis Date  . Anemia   . Anxiety   . Arthritis    "just about all over" (03/11/2017)  . Asthma   . Cervical cancer ([place])   . COPD (chronic obstructive pulmonary disease) ([place])   . Coronary artery disease   . Depression   . Diabetes mellitus without complication ([place])    pt denies this hx on 03/11/2017, patient denies this states she has low blood sugar  . Heart murmur   . Hyperlipidemia   . Low blood sugar   . Migraine    "work related; quit when I quit work in ~ 1997" (03/11/2017)  . Skin cancer    back    Surgical History: Past Surgical History:  Procedure Laterality Date  . APPENDECTOMY    . CARDIAC CATHETERIZATION  02/2017  . CATARACT EXTRACTION W/ INTRAOCULAR LENS  IMPLANT, BILATERAL Bilateral   . CORONARY ANGIOPLASTY WITH STENT PLACEMENT  03/11/2017  . CORONARY STENT INTERVENTION N/A 03/11/2017   Procedure: Coronary Stent Intervention;  Surgeon: Sherren Mocha, MD;  Location: [facility];  Service: Cardiovascular;  Laterality: N/A;  . IR RADIOLOGY PERIPHERAL GUIDED IV START  02/26/2017  . IR US GUIDE VASC ACCESS RIGHT  02/26/2017  . RIGHT/LEFT HEART CATH AND CORONARY ANGIOGRAPHY N/A 02/20/2017   Procedure: Right/Left Heart Cath and Coronary Angiography;  Surgeon: Sherren Mocha,  MD;  Location: [facility];  Service: Cardiovascular;  Laterality: N/A;  . SKIN CANCER EXCISION     "back"  . TEE WITHOUT CARDIOVERSION N/A 04/09/2017   Procedure: TRANSESOPHAGEAL ECHOCARDIOGRAM (TEE);  Surgeon: Sherren Mocha, MD;  Location: [facility];  Service: Open Heart Surgery;  Laterality: N/A;  . TRANSCATHETER AORTIC VALVE REPLACEMENT, TRANSFEMORAL N/A 04/09/2017   Procedure: TRANSCATHETER AORTIC VALVE REPLACEMENT, TRANSFEMORAL;  Surgeon: Sherren Mocha, MD;  Location: [facility];  Service: Open Heart Surgery;  Laterality: N/A;  . VAGINAL HYSTERECTOMY      Home  Medications:  Allergies as of 09/23/2018      Reactions   Latex Hives, Itching, Other (See Comments)   Caused blisters in her mouth   Lisinopril Other (See Comments)   Weakness   Penicillins Other (See Comments)   Intolerance to ALL "CILLINS" > UNSPECIFIED REACTIONS Has patient had a PCN reaction causing immediate rash, facial/tongue/throat swelling, SOB or lightheadedness with hypotension: UNSPECIFIED REACTION  Has patient had a PCN reaction causing severe rash involving mucus membranes or skin necrosis: UNSPECIFIED REACTION  Has patient had a PCN reaction that required hospitalization UNSPECIFIED REACTION  Has patient had a PCN reaction occurring within the last 10 years: UNSPECIFIED REACTION    Pravachol [pravastatin Sodium] Other (See Comments)   MYALGIAs LEG PAIN   Alendronate Sodium Other (See Comments)   UNSPECIFIED REACTION  [Patient denies this allergy]   Codeine Nausea And Vomiting   Morphine And Related Nausea And Vomiting   Prozac [fluoxetine Hcl] Anxiety   "FEELS SHAKY"   Symbicort [budesonide-formoterol Fumarate] Anxiety   "SHAKY"   Wellbutrin [bupropion] Anxiety   "JITTERY"      Medication List        Accurate as of 09/23/18 11:59 PM. Always use your most recent med list.          aspirin EC 81 MG tablet Take 81 mg by mouth daily.   calcium-vitamin D 500-200 MG-UNIT tablet Commonly known as:  OSCAL WITH D Take 1 tablet by mouth 2 (two) times daily.   conjugated estrogens vaginal cream Commonly known as:  PREMARIN Apply 0.41m (pea-sized amount)  just inside the vaginal introitus with a finger-tip on  Monday, Wednesday and Friday nights.   estradiol 0.1 MG/GM vaginal cream Commonly known as:  ESTRACE Apply 0.545m(pea-sized amount)  just inside the vaginal introitus with a finger-tip on Monday, Wednesday and Friday nights.   FLOVENT HFA 220 MCG/ACT inhaler Generic drug:  fluticasone Take 1 puff by mouth daily as needed (wheezing).   ICAPS AREDS 2  PO Take 1 capsule by mouth 2 (two) times daily.   loperamide 2 MG tablet Commonly known as:  IMODIUM A-D Take 2 mg by mouth as needed for diarrhea or loose stools.   metoprolol tartrate 25 MG tablet Commonly known as:  LOPRESSOR Take 0.5 tablets (12.5 mg total) 2 (two) times daily by mouth.   nitroGLYCERIN 0.4 MG SL tablet Commonly known as:  NITROSTAT Place 1 tablet (0.4 mg total) under the tongue every 5 (five) minutes x 3 doses as needed for chest pain.   rosuvastatin 10 MG tablet Commonly known as:  CRESTOR Take 10 mg by mouth daily with supper.   VENTOLIN HFA 108 (90 Base) MCG/ACT inhaler Generic drug:  albuterol Inhale 2 puffs into the lungs 2 (two) times daily as needed for wheezing or shortness of breath.   Vitamin D3 25 MCG (1000 UT) Caps Take 1,000 Units by mouth daily with lunch.  Allergies:  Allergies  Allergen Reactions  . Latex Hives, Itching and Other (See Comments)    Caused blisters in her mouth  . Lisinopril Other (See Comments)    Weakness   . Penicillins Other (See Comments)    Intolerance to ALL "CILLINS" > UNSPECIFIED REACTIONS  Has patient had a PCN reaction causing immediate rash, facial/tongue/throat swelling, SOB or lightheadedness with hypotension: UNSPECIFIED REACTION  Has patient had a PCN reaction causing severe rash involving mucus membranes or skin necrosis: UNSPECIFIED REACTION  Has patient had a PCN reaction that required hospitalization UNSPECIFIED REACTION  Has patient had a PCN reaction occurring within the last 10 years: UNSPECIFIED REACTION   . Pravachol [Pravastatin Sodium] Other (See Comments)    MYALGIAs LEG PAIN   . Alendronate Sodium Other (See Comments)    UNSPECIFIED REACTION  [Patient denies this allergy]   . Codeine Nausea And Vomiting  . Morphine And Related Nausea And Vomiting  . Prozac [Fluoxetine Hcl] Anxiety    "FEELS SHAKY"   . Symbicort [Budesonide-Formoterol Fumarate] Anxiety    "SHAKY"   .  Wellbutrin [Bupropion] Anxiety    "JITTERY"     Family History: No family history on file.  Social History:  reports that she has never smoked. She has never used smokeless tobacco. She reports that she does not drink alcohol or use drugs.  ROS: UROLOGY Frequent Urination?: No Hard to postpone urination?: No Burning/pain with urination?: No Get up at night to urinate?: No Leakage of urine?: No Urine stream starts and stops?: No Trouble starting stream?: No Do you have to strain to urinate?: No Blood in urine?: No Urinary tract infection?: No Sexually transmitted disease?: No Injury to kidneys or bladder?: No Painful intercourse?: No Weak stream?: No Currently pregnant?: No Vaginal bleeding?: No Last menstrual period?: n  Gastrointestinal Nausea?: No Vomiting?: No Indigestion/heartburn?: No Diarrhea?: No Constipation?: No  Constitutional Fever: No Night sweats?: No Weight loss?: No Fatigue?: No  Skin Skin rash/lesions?: No Itching?: No  Eyes Blurred vision?: No Double vision?: No  Ears/Nose/Throat Sore throat?: No Sinus problems?: No  Hematologic/Lymphatic Swollen glands?: No Easy bruising?: No  Cardiovascular Leg swelling?: No Chest pain?: No  Respiratory Cough?: No Shortness of breath?: No  Endocrine Excessive thirst?: No  Musculoskeletal Back pain?: No Joint pain?: No  Neurological Headaches?: No Dizziness?: No  Psychologic Depression?: No Anxiety?: No  Physical Exam: BP (!) 149/80 (BP Location: Left Arm, Patient Position: Sitting, Cuff Size: Normal)   Pulse 65   Ht 5' 6"  (1.676 m)   Wt 175 lb 8 oz (79.6 kg)   BMI 28.33 kg/m   Constitutional:  Well nourished. Alert and oriented, No acute distress. HEENT: [place] AT, moist mucus membranes.  Trachea midline, no masses. Cardiovascular: No clubbing, cyanosis, or edema. Respiratory: Normal respiratory effort, no increased work of breathing. GI: Abdomen is soft, non tender, non  distended, no abdominal masses. Liver and spleen not palpable.  No hernias appreciated.  Stool sample for occult testing is not indicated.   GU: No CVA tenderness.  No bladder fullness or masses.  Atrophic external genitalia, sparse pubic hair distribution, no lesions.  Normal urethral meatus, no lesions, no prolapse, no discharge.   Urethral caruncle.  No bladder fullness, tenderness or masses. Pale vagina mucosa, poor estrogen effect, no discharge, no lesions, fair pelvic support, grade I cystocele and no rectocele noted.  Cervix, uterus and adnexa are surgically absent.  Anus and perineum are without rashes or lesions.    Skin:  No rashes, bruises or suspicious lesions. Lymph: No cervical or inguinal adenopathy. Neurologic: Grossly intact, no focal deficits, moving all 4 extremities. Psychiatric: Normal mood and affect.    Laboratory Data: Lab Results  Component Value Date   WBC 7.7 04/22/2017   HGB 9.4 (L) 04/22/2017   HCT 29.4 (L) 04/22/2017   MCV 95 04/22/2017   PLT 300 04/22/2017    Lab Results  Component Value Date   CREATININE 0.85 04/10/2017    No results found for: PSA  No results found for: TESTOSTERONE  Lab Results  Component Value Date   HGBA1C 5.7 (H) 04/05/2017    No results found for: TSH  No results found for: CHOL, HDL, CHOLHDL, VLDL, LDLCALC  Lab Results  Component Value Date   AST 27 04/05/2017   Lab Results  Component Value Date   ALT 14 04/05/2017   No components found for: ALKALINEPHOPHATASE No components found for: BILIRUBINTOTAL  No results found for: ESTRADIOL  Urinalysis    Component Value Date/Time   COLORURINE YELLOW 04/05/2017 1325   APPEARANCEUR HAZY (A) 04/05/2017 1325   LABSPEC 1.024 04/05/2017 1325   PHURINE 5.0 04/05/2017 1325   GLUCOSEU NEGATIVE 04/05/2017 1325   HGBUR NEGATIVE 04/05/2017 1325   BILIRUBINUR NEGATIVE 04/05/2017 1325   KETONESUR NEGATIVE 04/05/2017 1325   PROTEINUR NEGATIVE 04/05/2017 1325   UROBILINOGEN  0.2 05/18/2015 1212   NITRITE NEGATIVE 04/05/2017 1325   LEUKOCYTESUR TRACE (A) 04/05/2017 1325    I have reviewed the labs.   Pertinent Imaging: CLINICAL DATA:  84 year old female with history of severe aortic stenosis. Preprocedural study prior to potential transcatheter aortic valve replacement (TAVR) procedure.  EXAM: CT ANGIOGRAPHY CHEST, ABDOMEN AND PELVIS  TECHNIQUE: Multidetector CT imaging through the chest, abdomen and pelvis was performed using the standard protocol during bolus administration of intravenous contrast. Multiplanar reconstructed images and MIPs were obtained and reviewed to evaluate the vascular anatomy.  CONTRAST:  80 mL of Isovue 370.  COMPARISON:  Chest CT 07/11/2010. CT the abdomen and pelvis 10/24/2009.  FINDINGS: CTA CHEST FINDINGS  Cardiovascular: Heart size is normal. There is no significant pericardial fluid, thickening or pericardial calcification. There is aortic atherosclerosis, as well as atherosclerosis of the great vessels of the mediastinum and the coronary arteries, including calcified atherosclerotic plaque in the left anterior descending coronary artery. Thickening calcification of the aortic valve, compatible with the reported clinical history of severe aortic stenosis.  Mediastinum/Lymph Nodes: No pathologically enlarged mediastinal or hilar lymph nodes. Small hiatal hernia. No axillary lymphadenopathy.  Lungs/Pleura: No acute consolidative airspace disease. No pleural effusions. No suspicious appearing pulmonary nodules or masses. Mild linear scarring in the lung bases bilaterally.  Musculoskeletal/Soft Tissues: There are no aggressive appearing lytic or blastic lesions noted in the visualized portions of the skeleton.  CTA ABDOMEN AND PELVIS FINDINGS  Hepatobiliary: No cystic or solid hepatic lesions. No intra or extrahepatic biliary ductal dilatation. Gallbladder is normal in appearance.  Pancreas:  No pancreatic mass. No pancreatic ductal dilatation. No pancreatic or peripancreatic fluid or inflammatory changes.  Spleen: Unremarkable.  Adrenals/Urinary Tract: Multiple tiny 2-3 mm low-attenuation lesions in both kidneys are too small to characterize, but are statistically likely to represent tiny cysts. Bilateral adrenal glands are normal in appearance. No hydroureteronephrosis. Urinary bladder is normal in appearance.  Stomach/Bowel: The appearance of the stomach is normal. No pathologic dilatation of small bowel or colon. Numerous colonic diverticulae are noted, particularly in the sigmoid colon, without surrounding inflammatory changes to suggest an acute  diverticulitis at this time. Normal appendix.  Vascular/Lymphatic: Aortic atherosclerosis, without evidence of aneurysm or dissection in the abdominal or pelvic vasculature. Vascular measurements pertinent to potential TAVR procedure, as detailed below. The celiac axis, superior mesenteric artery and inferior mesenteric artery are all widely patent without hemodynamically significant stenosis. Single renal arteries bilaterally are widely patent. No lymphadenopathy noted in the abdomen or pelvis.  Reproductive: Status post hysterectomy. Ovaries are not confidently identified may be surgically absent or atrophic.  Other: No significant volume of ascites.  No pneumoperitoneum.  Musculoskeletal: There are no aggressive appearing lytic or blastic lesions noted in the visualized portions of the skeleton.  VASCULAR MEASUREMENTS PERTINENT TO TAVR:  AORTA:  Minimal Aortic Diameter -  11 x 11 mm  Severity of Aortic Calcification -  moderate  RIGHT PELVIS:  Right Common Iliac Artery -  Minimal Diameter - 7.9 x 8.7 mm  Tortuosity - mild  Calcification - mild  Right External Iliac Artery -  Minimal Diameter - 5.9 x 6.1 mm  Tortuosity - mild  Calcification - none  Right Common Femoral Artery  -  Minimal Diameter - 6.3 x 6.8 mm  Tortuosity - mild  Calcification - none  LEFT PELVIS:  Left Common Iliac Artery -  Minimal Diameter - 8.2 x 7.7 mm  Tortuosity - mild  Calcification - none  Left External Iliac Artery -  Minimal Diameter - 6.2 x 6.2 mm  Tortuosity - mild  Calcification - none  Left Common Femoral Artery -  Minimal Diameter - 6.3 x 7.0 mm  Tortuosity - mild  Calcification - none  Review of the MIP images confirms the above findings.  IMPRESSION: 1. Vascular findings and measurements pertinent to potential TAVR procedure, as detailed above. This patient has suitable pelvic arterial access bilaterally. 2. Thickening calcification of the aortic valve, compatible with the reported clinical history of severe aortic stenosis. 3. Aortic atherosclerosis, in addition to left anterior descending coronary artery disease. 4. Small hiatal hernia. 5. Colonic diverticulosis without evidence of acute diverticulitis at this time. 6. Additional incidental findings, as above.   Electronically Signed   By: Vinnie Langton M.D.   On: 02/26/2017 14:44  I have independently reviewed the films and did find an etiology for her rUTI's.   Assessment & Plan:    1. Presumptive recurrent UTI's:  Criteria for recurrent UTI has not been met with 2 or more infections in 6 months or 3 or greater infections in one year  Patient is instructed to increase their water intake until the urine is pale yellow or clear (10 to 12 cups daily)  Patient is instructed to take probiotics (yogurt, oral pills or vaginal suppositories), take cranberry pills or drink the juice and Vitamin C 1,000 mg daily to acidify the urine Avoid soaking in tubs and wipe front to back after urinating Reviewed symptoms of UTI (fevers, chills, gross hematuria, mental status changes, dysuria, suprapubic pain, back pain and/or sudden worsening of urinary  symptoms) and advised not to  have urine checked or be treated for UTI if not experiencing symptoms Will request previous records of UTI's  2. Atrophic Vaginitis:  I explained to the patient that when women go through menopause and her estrogen levels are severely diminished, the normal vaginal flora will change.  This is due to an increase of the vaginal canal's pH. Because of this, the vaginal canal may be colonized by bacteria from the rectum instead of the protective lactobacillus.  This, accompanied by the  loss of the mucus barrier with vaginal atrophy, is a cause of recurrent urinary tract infections. In some studies, the use of vaginal estrogen cream has been demonstrated to reduce  recurrent urinary tract infections to one a year.  Patient was given a sample of vaginal estrogen cream (Premarin vaginal cream) and instructed to apply 0.63m (pea-sized amount)  just inside the vaginal introitus with a finger-tip on Monday, Wednesday and Friday nights.  I explained to the patient that vaginally administered estrogen, which causes only a slight increase in the blood estrogen levels, have fewer contraindications and adverse systemic effects that oral HT. I have also given prescriptions for the Estrace cream and Premarin cream, so that the patient may carry them to the pharmacy to see which one of the branded creams would be most economical for her.  If she finds both medications cost prohibitive, she is instructed to call the office.  We can then call in a compounded vaginal estrogen cream for the patient that may be more affordable.   She will follow up in three months for an exam.      Return in about 3 months (around 12/24/2018) for exam.  These notes generated with voice recognition software. I apologize for typographical errors.  MLaneta Simmers [id number] SCawker CityBTild[place] [phone] I, [place] am acting as a sEducation administratorfor SConstellation [employer]  [employer]   I have reviewed the above documentation for accuracy and completeness, and I agree with the above.    SZara Council PA-C

## 2018-10-05 NOTE — Progress Notes (Signed)
Cardiology Office Note  Date:  10/06/2018   ID:  Amy Kaiser, DOB 15-Apr-1929, MRN [id number]  PCP:  Harlan Stains, MD   Chief Complaint  Patient presents with  . other    6 month follow up; pt. of Dr. Thurman Coyer. Meds reviewed by the pt.'s bottles. "doing well."     HPI:  Amy Kaiser is a 84 y.o. Female with PMH of  severe aortic stenosis with progressive symptoms of diastolic heart failure  TAVR with a 20 mm Sapien 3 valve via a percutaneous transfemoral approach 04/09/2017.   severe stenosis of the proximal RCA  stenting with a 4 mm drug-eluting stent 03/11/2017.   Gets out to go work in her garden.   Had a knee problem, this is slowly getting better She denies chest pain, shortness of breath, or leg swelling. Walks with a cane No regular exercise program but does stay busy TV in the day but active in the commercials Family checks BP at home, wrist cuff Was rushing today, numbers high, her in the past  Denies any shortness of breath or chest tightness that she had previously before valve surgery  EKG personally reviewed by myself on todays visit Shows normal sinus rhythm rate 53 bpm left axis deviation no other significant ST or T wave changes  Echo by Dr. Wynonia Lawman in 2019 Report reviewed from June 2019, moderately elevated right heart pressures, normal prosthetic aortic valve, moderate MR and ejection fraction 60%   PMH:   has a past medical history of Anemia, Anxiety, Arthritis, Asthma, Cervical cancer ([place]), COPD (chronic obstructive pulmonary disease) ([place]), Coronary artery disease, Depression, Diabetes mellitus without complication ([place]), Heart murmur, Hyperlipidemia, Low blood sugar, Migraine, and Skin cancer.  PSH:    Past Surgical History:  Procedure Laterality Date  . APPENDECTOMY    . CARDIAC CATHETERIZATION  02/2017  . CATARACT EXTRACTION W/ INTRAOCULAR LENS  IMPLANT, BILATERAL Bilateral   . CORONARY ANGIOPLASTY WITH STENT PLACEMENT  03/11/2017  .  CORONARY STENT INTERVENTION N/A 03/11/2017   Procedure: Coronary Stent Intervention;  Surgeon: Sherren Mocha, MD;  Location: [facility];  Service: Cardiovascular;  Laterality: N/A;  . IR RADIOLOGY PERIPHERAL GUIDED IV START  02/26/2017  . IR US GUIDE VASC ACCESS RIGHT  02/26/2017  . RIGHT/LEFT HEART CATH AND CORONARY ANGIOGRAPHY N/A 02/20/2017   Procedure: Right/Left Heart Cath and Coronary Angiography;  Surgeon: Sherren Mocha, MD;  Location: [facility];  Service: Cardiovascular;  Laterality: N/A;  . SKIN CANCER EXCISION     "back"  . TEE WITHOUT CARDIOVERSION N/A 04/09/2017   Procedure: TRANSESOPHAGEAL ECHOCARDIOGRAM (TEE);  Surgeon: Sherren Mocha, MD;  Location: [facility];  Service: Open Heart Surgery;  Laterality: N/A;  . TRANSCATHETER AORTIC VALVE REPLACEMENT, TRANSFEMORAL N/A 04/09/2017   Procedure: TRANSCATHETER AORTIC VALVE REPLACEMENT, TRANSFEMORAL;  Surgeon: Sherren Mocha, MD;  Location: [facility];  Service: Open Heart Surgery;  Laterality: N/A;  . VAGINAL HYSTERECTOMY      Current Outpatient Medications  Medication Sig Dispense Refill  . aspirin EC 81 MG tablet Take 81 mg by mouth daily.    . calcium-vitamin D (OSCAL WITH D) 500-200 MG-UNIT tablet Take 1 tablet by mouth 2 (two) times daily.    . Cholecalciferol (VITAMIN D3) 1000 units CAPS Take 1,000 Units by mouth daily with lunch.     . estradiol (ESTRACE VAGINAL) 0.1 MG/GM vaginal cream Apply 0.5mg  (pea-sized amount)  just inside the vaginal introitus with a finger-tip on Monday, Wednesday and Friday nights.  30 g 12  . FLOVENT HFA 220 MCG/ACT inhaler Take 1 puff by mouth daily as needed (wheezing).   12  . loperamide (IMODIUM A-D) 2 MG tablet Take 2 mg by mouth as needed for diarrhea or loose stools.    . metoprolol tartrate (LOPRESSOR) 25 MG tablet Take 0.5 tablets (12.5 mg total) 2 (two) times daily by mouth. 180 tablet 2  . Multiple Vitamins-Minerals (ICAPS AREDS 2 PO) Take 1 capsule by mouth 2 (two) times daily.    .  nitroGLYCERIN (NITROSTAT) 0.4 MG SL tablet Place 1 tablet (0.4 mg total) under the tongue every 5 (five) minutes x 3 doses as needed for chest pain. 25 tablet 1  . rosuvastatin (CRESTOR) 10 MG tablet Take 10 mg by mouth daily with supper.     . VENTOLIN HFA 108 (90 BASE) MCG/ACT inhaler Inhale 2 puffs into the lungs 2 (two) times daily as needed for wheezing or shortness of breath.   2   No current facility-administered medications for this visit.      Allergies:   Latex; Lisinopril; Penicillins; Pravachol [pravastatin sodium]; Alendronate sodium; Codeine; Morphine and related; Prozac [fluoxetine hcl]; Symbicort [budesonide-formoterol fumarate]; and Wellbutrin [bupropion]   Social History:  The patient  reports that she has never smoked. She has never used smokeless tobacco. She reports that she does not drink alcohol or use drugs.   Family History:   family history is not on file.    Review of Systems: Review of Systems  Constitutional: Negative.   Respiratory: Negative.   Cardiovascular: Negative.   Gastrointestinal: Negative.   Musculoskeletal: Negative.   Neurological: Negative.   Psychiatric/Behavioral: Negative.   All other systems reviewed and are negative.    PHYSICAL EXAM: VS:  BP 140/90 (BP Location: Left Arm, Patient Position: Sitting, Cuff Size: Normal)   Pulse (!) 53   Ht 5' 6.5" (1.689 m)   Wt 174 lb 12 oz (79.3 kg)   BMI 27.78 kg/m  , BMI Body mass index is 27.78 kg/m. GEN: Well nourished, well developed, in no acute distress  HEENT: normal  Neck: no JVD, carotid bruits, or masses Cardiac: RRR; no murmurs, rubs, or gallops,no edema  Respiratory:  clear to auscultation bilaterally, normal work of breathing GI: soft, nontender, nondistended, + BS MS: no deformity or atrophy  Skin: warm and dry, no rash Neuro:  Strength and sensation are intact Psych: euthymic mood, full affect   Recent Labs: No results found for requested labs within last 8760 hours.     Lipid Panel No results found for: CHOL, HDL, LDLCALC, TRIG    Wt Readings from Last 3 Encounters:  10/06/18 174 lb 12 oz (79.3 kg)  09/23/18 175 lb 8 oz (79.6 kg)  04/28/18 176 lb (79.8 kg)      ASSESSMENT AND PLAN:  Chronic diastolic CHF (congestive heart failure) (HCC) Appears relatively euvolemic but recent echocardiogram with moderately elevated right heart pressures No ankle edema, denies any PND orthopnea shortness of breath on exertion She is declining diuretic  Coronary artery disease with exertional angina ([place]) - Plan: EKG 12-Lead Currently with no symptoms of angina. No further workup at this time. Continue current medication regimen.  No further testing at this time  Severe aortic stenosis - Plan: EKG 12-Lead Tolerated surgery well, aortic valve imaged June 2019, performing well  S/P TAVR (transcatheter aortic valve replacement) Recent echocardiogram results reviewed with her No further testing at this time, stressed the importance that she call us for any  worsening shortness of breath  Essential hypertension Blood pressure mildly elevated on today's visit but was rushing No medication changes made, she will monitor blood pressure at home and call us if this continues to run high  Disposition:   F/U  6 months   Total encounter time more than 25 minutes  Greater than 50% was spent in counseling and coordination of care with the patient    Orders Placed This Encounter  Procedures  . EKG 12-Lead     Signed, Esmond Plants, M.D., Ph.D. 10/06/2018  [place], [place]

## 2018-10-06 ENCOUNTER — Encounter: Payer: Self-pay | Admitting: Cardiovascular Disease

## 2018-10-06 ENCOUNTER — Ambulatory Visit: Payer: Medicare Other | Admitting: Cardiovascular Disease

## 2018-10-06 VITALS — BP 140/90 | HR 53 | Ht 66.5 in | Wt 174.8 lb

## 2018-10-06 DIAGNOSIS — I25118 Atherosclerotic heart disease of native coronary artery with other forms of angina pectoris: Secondary | ICD-10-CM | POA: Diagnosis not present

## 2018-10-06 DIAGNOSIS — I350 Nonrheumatic aortic (valve) stenosis: Secondary | ICD-10-CM | POA: Diagnosis not present

## 2018-10-06 DIAGNOSIS — I5032 Chronic diastolic (congestive) heart failure: Secondary | ICD-10-CM

## 2018-10-06 DIAGNOSIS — Z952 Presence of prosthetic heart valve: Secondary | ICD-10-CM

## 2018-10-06 DIAGNOSIS — I10 Essential (primary) hypertension: Secondary | ICD-10-CM

## 2018-10-06 NOTE — Patient Instructions (Addendum)
We will request labs from [facility] triad, PMD (Dr. Dema Severin) Lipid panel   Medication Instructions:  No changes  If you need a refill on your cardiac medications before your next appointment, please call your pharmacy.    Lab work: No new labs needed   If you have labs (blood work) drawn today and your tests are completely normal, you will receive your results only by: Marland Kitchen MyChart Message (if you have MyChart) OR . A paper copy in the mail If you have any lab test that is abnormal or we need to change your treatment, we will call you to review the results.   Testing/Procedures: No new testing needed   Follow-Up: At [facility], you and your health needs are our priority.  As part of our continuing mission to provide you with exceptional heart care, we have created designated Provider Care Teams.  These Care Teams include your primary Cardiologist (physician) and Advanced Practice Providers (APPs -  Physician Assistants and Nurse Practitioners) who all work together to provide you with the care you need, when you need it.  . You will need a follow up appointment in 6 months .   Please call our office 2 months in advance to schedule this appointment.    . Providers on your designated Care Team:   . Murray Hodgkins, NP . Christell Faith, PA-C . Marrianne Mood, PA-C  Any Other Special Instructions Will Be Listed Below (If Applicable).  For educational health videos Log in to : [email or web address] Or : [email or web address], password : [email or web address]

## 2018-10-20 ENCOUNTER — Other Ambulatory Visit: Payer: Self-pay | Admitting: Cardiovascular Disease

## 2018-12-25 ENCOUNTER — Ambulatory Visit (INDEPENDENT_AMBULATORY_CARE_PROVIDER_SITE_OTHER): Payer: Medicare Other | Admitting: Urology

## 2018-12-25 ENCOUNTER — Encounter: Payer: Self-pay | Admitting: Urology

## 2018-12-25 VITALS — BP 188/75 | HR 78 | Ht 66.0 in | Wt 178.0 lb

## 2018-12-25 DIAGNOSIS — N390 Urinary tract infection, site not specified: Secondary | ICD-10-CM

## 2018-12-25 LAB — MICROSCOPIC EXAMINATION: RBC MICROSCOPIC, UA: NONE SEEN /HPF (ref 0–2)

## 2018-12-25 LAB — URINALYSIS, COMPLETE
BILIRUBIN UA: NEGATIVE
Glucose, UA: NEGATIVE
KETONES UA: NEGATIVE
Leukocytes, UA: NEGATIVE
NITRITE UA: NEGATIVE
PH UA: 6.5 (ref 5.0–7.5)
Protein, UA: NEGATIVE
RBC UA: NEGATIVE
SPEC GRAV UA: 1.02 (ref 1.005–1.030)
Urobilinogen, Ur: 0.2 mg/dL (ref 0.2–1.0)

## 2018-12-25 NOTE — Progress Notes (Signed)
12/25/2018 11:32 AM   Amy Kaiser [birth year] [id number]  Referring provider: Harlan Stains, MD [place], [place] [address]  No chief complaint on file.   HPI: Patient is a 85 -year-old Caucasian female with a history of rUTI's and vaginal atrophy who presents today for a 3 months follow up with her granddaughter, Amy Kaiser.    Background history Patient states that she has had several urinary tract infections over the last year. Reviewing her records,  she has had one documented positive urine culture. + E.Coli on 06/09/2018 - treated with Bactrim x 20 days   Her symptoms with a urinary tract infection consist of dysuria, chills with voiding and suprapubic pain.   She does not have a history of nephrolithiasis, GU surgery or GU trauma.  She is not sexually active.  She is postmenopausal.  She admits to constipation and/or diarrhea.  She does not engage in good perineal hygiene. She does not take tub baths.  She does not have incontinence.  CT angio abd/pelvis w and w/o on 02/26/2017 revealed multiple tiny 2-3 mm low-attenuation lesions in both kidneys are too small to characterize, but are statistically likely to represent tiny cysts. Bilateral adrenal glands are normal in appearance. No hydroureteronephrosis. Urinary bladder is normal in appearance.  She reports that she "forgets" to drink water and attributes that to her hx of UTIs.  She occasionally drinks water and orange/pineapple juice, a cup of coffee daily and sweet tea occasionally. She drinks soda (Sprite) frequently. No alcohol or energy drinks.  Her PVR was 52 mL.    Today, she is complaining of urgency, foul smelling urine and nocturia x 2-3.  Her memory is getting worse and her daughter and granddaughter are concerned there may be an UTI.   Patient denies any gross hematuria, dysuria or suprapubic/flank pain.  Patient denies any fevers, chills, nausea or vomiting.   UA is positive for many bacteria.      She states she is very forgetful concerning application of her vaginal estrogen cream.    PMH: Past Medical History:  Diagnosis Date  . Anemia   . Anxiety   . Arthritis    "just about all over" (03/11/2017)  . Asthma   . Cervical cancer ([place])   . COPD (chronic obstructive pulmonary disease) ([place])   . Coronary artery disease   . Depression   . Diabetes mellitus without complication ([place])    pt denies this hx on 03/11/2017, patient denies this states she has low blood sugar  . Heart murmur   . Hyperlipidemia   . Low blood sugar   . Migraine    "work related; quit when I quit work in ~ 1997" (03/11/2017)  . Skin cancer    back    Surgical History: Past Surgical History:  Procedure Laterality Date  . APPENDECTOMY    . CARDIAC CATHETERIZATION  02/2017  . CATARACT EXTRACTION W/ INTRAOCULAR LENS  IMPLANT, BILATERAL Bilateral   . CORONARY ANGIOPLASTY WITH STENT PLACEMENT  03/11/2017  . CORONARY STENT INTERVENTION N/A 03/11/2017   Procedure: Coronary Stent Intervention;  Surgeon: Sherren Mocha, MD;  Location: [facility];  Service: Cardiovascular;  Laterality: N/A;  . IR RADIOLOGY PERIPHERAL GUIDED IV START  02/26/2017  . IR US GUIDE VASC ACCESS RIGHT  02/26/2017  . RIGHT/LEFT HEART CATH AND CORONARY ANGIOGRAPHY N/A 02/20/2017   Procedure: Right/Left Heart Cath and Coronary Angiography;  Surgeon: Sherren Mocha, MD;  Location: [facility];  Service: Cardiovascular;  Laterality: N/A;  . SKIN CANCER EXCISION     "back"  . TEE WITHOUT CARDIOVERSION N/A 04/09/2017   Procedure: TRANSESOPHAGEAL ECHOCARDIOGRAM (TEE);  Surgeon: Sherren Mocha, MD;  Location: [facility];  Service: Open Heart Surgery;  Laterality: N/A;  . TRANSCATHETER AORTIC VALVE REPLACEMENT, TRANSFEMORAL N/A 04/09/2017   Procedure: TRANSCATHETER AORTIC VALVE REPLACEMENT, TRANSFEMORAL;  Surgeon: Sherren Mocha, MD;  Location: [facility];  Service: Open Heart Surgery;  Laterality: N/A;  . VAGINAL HYSTERECTOMY      Home  Medications:  Allergies as of 12/25/2018      Reactions   Latex Hives, Itching, Other (See Comments)   Caused blisters in her mouth   Lisinopril Other (See Comments)   Weakness   Penicillins Other (See Comments)   Intolerance to ALL "CILLINS" > UNSPECIFIED REACTIONS Has patient had a PCN reaction causing immediate rash, facial/tongue/throat swelling, SOB or lightheadedness with hypotension: UNSPECIFIED REACTION  Has patient had a PCN reaction causing severe rash involving mucus membranes or skin necrosis: UNSPECIFIED REACTION  Has patient had a PCN reaction that required hospitalization UNSPECIFIED REACTION  Has patient had a PCN reaction occurring within the last 10 years: UNSPECIFIED REACTION    Pravachol [pravastatin Sodium] Other (See Comments)   MYALGIAs LEG PAIN   Alendronate Sodium Other (See Comments)   UNSPECIFIED REACTION  [Patient denies this allergy]   Codeine Nausea And Vomiting   Morphine And Related Nausea And Vomiting   Prozac [fluoxetine Hcl] Anxiety   "FEELS SHAKY"   Symbicort [budesonide-formoterol Fumarate] Anxiety   "SHAKY"   Wellbutrin [bupropion] Anxiety   "JITTERY"      Medication List       Accurate as of December 25, 2018 11:32 AM. Always use your most recent med list.        aspirin EC 81 MG tablet Take 81 mg by mouth daily.   calcium-vitamin D 500-200 MG-UNIT tablet Commonly known as:  OSCAL WITH D Take 1 tablet by mouth 2 (two) times daily.   estradiol 0.1 MG/GM vaginal cream Commonly known as:  ESTRACE VAGINAL Apply 0.5mg  (pea-sized amount)  just inside the vaginal introitus with a finger-tip on Monday, Wednesday and Friday nights.   FLOVENT HFA 220 MCG/ACT inhaler Generic drug:  fluticasone Take 1 puff by mouth daily as needed (wheezing).   ICAPS AREDS 2 PO Take 1 capsule by mouth 2 (two) times daily.   loperamide 2 MG tablet Commonly known as:  IMODIUM A-D Take 2 mg by mouth as needed for diarrhea or loose stools.   metoprolol  tartrate 25 MG tablet Commonly known as:  LOPRESSOR TAKE 1/2 (ONE-HALF) TABLET BY MOUTH TWICE DAILY   nitroGLYCERIN 0.4 MG SL tablet Commonly known as:  NITROSTAT Place 1 tablet (0.4 mg total) under the tongue every 5 (five) minutes x 3 doses as needed for chest pain.   rosuvastatin 10 MG tablet Commonly known as:  CRESTOR Take 10 mg by mouth daily with supper.   VENTOLIN HFA 108 (90 Base) MCG/ACT inhaler Generic drug:  albuterol Inhale 2 puffs into the lungs 2 (two) times daily as needed for wheezing or shortness of breath.   Vitamin D3 25 MCG (1000 UT) Caps Take 1,000 Units by mouth daily with lunch.       Allergies:  Allergies  Allergen Reactions  . Latex Hives, Itching and Other (See Comments)    Caused blisters in her mouth  . Lisinopril Other (See Comments)    Weakness   .  Penicillins Other (See Comments)    Intolerance to ALL "CILLINS" > UNSPECIFIED REACTIONS  Has patient had a PCN reaction causing immediate rash, facial/tongue/throat swelling, SOB or lightheadedness with hypotension: UNSPECIFIED REACTION  Has patient had a PCN reaction causing severe rash involving mucus membranes or skin necrosis: UNSPECIFIED REACTION  Has patient had a PCN reaction that required hospitalization UNSPECIFIED REACTION  Has patient had a PCN reaction occurring within the last 10 years: UNSPECIFIED REACTION   . Pravachol [Pravastatin Sodium] Other (See Comments)    MYALGIAs LEG PAIN   . Alendronate Sodium Other (See Comments)    UNSPECIFIED REACTION  [Patient denies this allergy]   . Codeine Nausea And Vomiting  . Morphine And Related Nausea And Vomiting  . Prozac [Fluoxetine Hcl] Anxiety    "FEELS SHAKY"   . Symbicort [Budesonide-Formoterol Fumarate] Anxiety    "SHAKY"   . Wellbutrin [Bupropion] Anxiety    "JITTERY"     Family History: No family history on file.  Social History:  reports that she has never smoked. She has never used smokeless tobacco. She reports  that she does not drink alcohol or use drugs.  ROS: UROLOGY Frequent Urination?: No Hard to postpone urination?: Yes Burning/pain with urination?: No Get up at night to urinate?: Yes Leakage of urine?: No Urine stream starts and stops?: No Trouble starting stream?: No Do you have to strain to urinate?: No Blood in urine?: No Urinary tract infection?: No Sexually transmitted disease?: No Injury to kidneys or bladder?: No Painful intercourse?: No Weak stream?: No Currently pregnant?: No Vaginal bleeding?: No Last menstrual period?: n  Gastrointestinal Nausea?: No Vomiting?: No Indigestion/heartburn?: No Diarrhea?: No Constipation?: No  Constitutional Fever: No Night sweats?: No Weight loss?: No Fatigue?: No  Skin Skin rash/lesions?: No Itching?: No  Eyes Blurred vision?: No Double vision?: No  Ears/Nose/Throat Sore throat?: No Sinus problems?: No  Hematologic/Lymphatic Swollen glands?: No Easy bruising?: No  Cardiovascular Leg swelling?: No Chest pain?: No  Respiratory Cough?: No Shortness of breath?: No  Endocrine Excessive thirst?: No  Musculoskeletal Back pain?: No Joint pain?: No  Neurological Headaches?: No Dizziness?: No  Psychologic Depression?: No Anxiety?: No  Physical Exam: BP (!) 188/75 (BP Location: Left Arm, Patient Position: Sitting)   Pulse 78   Ht 5\' 6"  (1.676 m)   Wt 178 lb (80.7 kg)   BMI 28.73 kg/m   Constitutional:  Well nourished. Alert and oriented, No acute distress. HEENT: [place] AT, moist mucus membranes.  Trachea midline, no masses. Cardiovascular: No clubbing, cyanosis, or edema. Respiratory: Normal respiratory effort, no increased work of breathing. Neurologic: Grossly intact, no focal deficits, moving all 4 extremities. Psychiatric: Normal mood and affect.   Laboratory Data: Lab Results  Component Value Date   WBC 7.7 04/22/2017   HGB 9.4 (L) 04/22/2017   HCT 29.4 (L) 04/22/2017   MCV 95 04/22/2017    PLT 300 04/22/2017    Lab Results  Component Value Date   CREATININE 0.85 04/10/2017    No results found for: PSA  No results found for: TESTOSTERONE  Lab Results  Component Value Date   HGBA1C 5.7 (H) 04/05/2017    No results found for: TSH  No results found for: CHOL, HDL, CHOLHDL, VLDL, LDLCALC  Lab Results  Component Value Date   AST 27 04/05/2017   Lab Results  Component Value Date   ALT 14 04/05/2017   No components found for: ALKALINEPHOPHATASE No components found for: BILIRUBINTOTAL  No results found for: ESTRADIOL  Urinalysis Many bacteria.  See Epic.   I have reviewed the labs.   Pertinent Imaging: CLINICAL DATA:  85 year old female with history of severe aortic stenosis. Preprocedural study prior to potential transcatheter aortic valve replacement (TAVR) procedure.  EXAM: CT ANGIOGRAPHY CHEST, ABDOMEN AND PELVIS  TECHNIQUE: Multidetector CT imaging through the chest, abdomen and pelvis was performed using the standard protocol during bolus administration of intravenous contrast. Multiplanar reconstructed images and MIPs were obtained and reviewed to evaluate the vascular anatomy.  CONTRAST:  80 mL of Isovue 370.  COMPARISON:  Chest CT 07/11/2010. CT the abdomen and pelvis 10/24/2009.  FINDINGS: CTA CHEST FINDINGS  Cardiovascular: Heart size is normal. There is no significant pericardial fluid, thickening or pericardial calcification. There is aortic atherosclerosis, as well as atherosclerosis of the great vessels of the mediastinum and the coronary arteries, including calcified atherosclerotic plaque in the left anterior descending coronary artery. Thickening calcification of the aortic valve, compatible with the reported clinical history of severe aortic stenosis.  Mediastinum/Lymph Nodes: No pathologically enlarged mediastinal or hilar lymph nodes. Small hiatal hernia. No axillary lymphadenopathy.  Lungs/Pleura: No acute  consolidative airspace disease. No pleural effusions. No suspicious appearing pulmonary nodules or masses. Mild linear scarring in the lung bases bilaterally.  Musculoskeletal/Soft Tissues: There are no aggressive appearing lytic or blastic lesions noted in the visualized portions of the skeleton.  CTA ABDOMEN AND PELVIS FINDINGS  Hepatobiliary: No cystic or solid hepatic lesions. No intra or extrahepatic biliary ductal dilatation. Gallbladder is normal in appearance.  Pancreas: No pancreatic mass. No pancreatic ductal dilatation. No pancreatic or peripancreatic fluid or inflammatory changes.  Spleen: Unremarkable.  Adrenals/Urinary Tract: Multiple tiny 2-3 mm low-attenuation lesions in both kidneys are too small to characterize, but are statistically likely to represent tiny cysts. Bilateral adrenal glands are normal in appearance. No hydroureteronephrosis. Urinary bladder is normal in appearance.  Stomach/Bowel: The appearance of the stomach is normal. No pathologic dilatation of small bowel or colon. Numerous colonic diverticulae are noted, particularly in the sigmoid colon, without surrounding inflammatory changes to suggest an acute diverticulitis at this time. Normal appendix.  Vascular/Lymphatic: Aortic atherosclerosis, without evidence of aneurysm or dissection in the abdominal or pelvic vasculature. Vascular measurements pertinent to potential TAVR procedure, as detailed below. The celiac axis, superior mesenteric artery and inferior mesenteric artery are all widely patent without hemodynamically significant stenosis. Single renal arteries bilaterally are widely patent. No lymphadenopathy noted in the abdomen or pelvis.  Reproductive: Status post hysterectomy. Ovaries are not confidently identified may be surgically absent or atrophic.  Other: No significant volume of ascites.  No pneumoperitoneum.  Musculoskeletal: There are no aggressive appearing  lytic or blastic lesions noted in the visualized portions of the skeleton.  VASCULAR MEASUREMENTS PERTINENT TO TAVR:  AORTA:  Minimal Aortic Diameter -  11 x 11 mm  Severity of Aortic Calcification -  moderate  RIGHT PELVIS:  Right Common Iliac Artery -  Minimal Diameter - 7.9 x 8.7 mm  Tortuosity - mild  Calcification - mild  Right External Iliac Artery -  Minimal Diameter - 5.9 x 6.1 mm  Tortuosity - mild  Calcification - none  Right Common Femoral Artery -  Minimal Diameter - 6.3 x 6.8 mm  Tortuosity - mild  Calcification - none  LEFT PELVIS:  Left Common Iliac Artery -  Minimal Diameter - 8.2 x 7.7 mm  Tortuosity - mild  Calcification - none  Left External Iliac Artery -  Minimal Diameter - 6.2 x 6.2 mm  Tortuosity - mild  Calcification - none  Left Common Femoral Artery -  Minimal Diameter - 6.3 x 7.0 mm  Tortuosity - mild  Calcification - none  Review of the MIP images confirms the above findings.  IMPRESSION: 1. Vascular findings and measurements pertinent to potential TAVR procedure, as detailed above. This patient has suitable pelvic arterial access bilaterally. 2. Thickening calcification of the aortic valve, compatible with the reported clinical history of severe aortic stenosis. 3. Aortic atherosclerosis, in addition to left anterior descending coronary artery disease. 4. Small hiatal hernia. 5. Colonic diverticulosis without evidence of acute diverticulitis at this time. 6. Additional incidental findings, as above.   Electronically Signed   By: Vinnie Langton M.D.   On: 02/26/2017 14:44    Assessment & Plan:    1. Presumptive recurrent UTI's:  -patient with memory issues - will send UA for culture - will start an antibiotic if culture is positive and see if her memory improves - if culture is negative and/or memory does not improve after antibiotic explained that they need to  pursue other etiologies for the memory issues  2. Atrophic Vaginitis:  -admits that she is very forgetful and is not applying the cream as prescribed   Return for pending urine culture .  These notes generated with voice recognition software. I apologize for typographical errors.  Laneta Simmers  [facility] [address]  [place], [place] [address] [phone]

## 2018-12-29 LAB — CULTURE, URINE COMPREHENSIVE

## 2018-12-30 ENCOUNTER — Telehealth: Payer: Self-pay | Admitting: Urology

## 2018-12-30 MED ORDER — NITROFURANTOIN MONOHYD MACRO 100 MG PO CAPS: 100.0000 mg | ORAL_CAPSULE | Freq: Two times a day (BID) | ORAL | 0 refills | Status: AC

## 2018-12-30 NOTE — Telephone Encounter (Signed)
Pt LMOM and requests a call back, she is wanting her test results from 12/25/2018

## 2018-12-30 NOTE — Telephone Encounter (Signed)
-----   Message from Nori Riis, PA-C sent at 12/30/2018  7:43 AM EST ----- Please let Amy Kaiser's daughter, [name], know that her urine culture was positive for infection.  She needs to start Macrobid 100 mg, one BID for seven days.

## 2018-12-30 NOTE — Telephone Encounter (Signed)
Called and advised patient of infection and antibiotic sent to pharmacy. Patient verbalized understanding.

## 2019-02-01 ENCOUNTER — Emergency Department (HOSPITAL_COMMUNITY): Payer: Medicare Other

## 2019-02-01 ENCOUNTER — Encounter (HOSPITAL_COMMUNITY): Payer: Self-pay | Admitting: Emergency Medicine

## 2019-02-01 ENCOUNTER — Observation Stay (HOSPITAL_COMMUNITY)
Admission: EM | Admit: 2019-02-01 | Discharge: 2019-02-02 | Disposition: A | Discharge status: Home / Self Care | Payer: Medicare Other | Attending: Internal Medicine | Admitting: Internal Medicine

## 2019-02-01 ENCOUNTER — Other Ambulatory Visit: Payer: Self-pay

## 2019-02-01 DIAGNOSIS — R2689 Other abnormalities of gait and mobility: Secondary | ICD-10-CM | POA: Diagnosis not present

## 2019-02-01 DIAGNOSIS — I700 Atherosclerosis of aorta: Secondary | ICD-10-CM | POA: Insufficient documentation

## 2019-02-01 DIAGNOSIS — Z952 Presence of prosthetic heart valve: Secondary | ICD-10-CM

## 2019-02-01 DIAGNOSIS — Z79899 Other long term (current) drug therapy: Secondary | ICD-10-CM | POA: Diagnosis not present

## 2019-02-01 DIAGNOSIS — M50321 Other cervical disc degeneration at C4-C5 level: Secondary | ICD-10-CM | POA: Diagnosis not present

## 2019-02-01 DIAGNOSIS — K4090 Unilateral inguinal hernia, without obstruction or gangrene, not specified as recurrent: Secondary | ICD-10-CM | POA: Diagnosis not present

## 2019-02-01 DIAGNOSIS — S32810A Multiple fractures of pelvis with stable disruption of pelvic ring, initial encounter for closed fracture: Secondary | ICD-10-CM

## 2019-02-01 DIAGNOSIS — W19XXXA Unspecified fall, initial encounter: Secondary | ICD-10-CM | POA: Insufficient documentation

## 2019-02-01 DIAGNOSIS — R296 Repeated falls: Secondary | ICD-10-CM | POA: Insufficient documentation

## 2019-02-01 DIAGNOSIS — S8991XA Unspecified injury of right lower leg, initial encounter: Secondary | ICD-10-CM | POA: Diagnosis not present

## 2019-02-01 DIAGNOSIS — K5730 Diverticulosis of large intestine without perforation or abscess without bleeding: Secondary | ICD-10-CM | POA: Insufficient documentation

## 2019-02-01 DIAGNOSIS — M25522 Pain in left elbow: Secondary | ICD-10-CM | POA: Diagnosis not present

## 2019-02-01 DIAGNOSIS — I110 Hypertensive heart disease with heart failure: Secondary | ICD-10-CM | POA: Diagnosis not present

## 2019-02-01 DIAGNOSIS — Z888 Allergy status to other drugs, medicaments and biological substances status: Secondary | ICD-10-CM | POA: Diagnosis not present

## 2019-02-01 DIAGNOSIS — S0990XA Unspecified injury of head, initial encounter: Secondary | ICD-10-CM | POA: Diagnosis not present

## 2019-02-01 DIAGNOSIS — M6281 Muscle weakness (generalized): Secondary | ICD-10-CM | POA: Diagnosis not present

## 2019-02-01 DIAGNOSIS — S329XXA Fracture of unspecified parts of lumbosacral spine and pelvis, initial encounter for closed fracture: Principal | ICD-10-CM | POA: Diagnosis present

## 2019-02-01 DIAGNOSIS — Z880 Allergy status to penicillin: Secondary | ICD-10-CM | POA: Diagnosis not present

## 2019-02-01 DIAGNOSIS — S3210XA Unspecified fracture of sacrum, initial encounter for closed fracture: Secondary | ICD-10-CM | POA: Diagnosis not present

## 2019-02-01 DIAGNOSIS — I350 Nonrheumatic aortic (valve) stenosis: Secondary | ICD-10-CM | POA: Diagnosis not present

## 2019-02-01 DIAGNOSIS — R2681 Unsteadiness on feet: Secondary | ICD-10-CM | POA: Diagnosis not present

## 2019-02-01 DIAGNOSIS — Q211 Atrial septal defect: Secondary | ICD-10-CM | POA: Diagnosis not present

## 2019-02-01 DIAGNOSIS — I25118 Atherosclerotic heart disease of native coronary artery with other forms of angina pectoris: Secondary | ICD-10-CM | POA: Diagnosis not present

## 2019-02-01 DIAGNOSIS — Z7982 Long term (current) use of aspirin: Secondary | ICD-10-CM | POA: Insufficient documentation

## 2019-02-01 DIAGNOSIS — I10 Essential (primary) hypertension: Secondary | ICD-10-CM | POA: Diagnosis present

## 2019-02-01 DIAGNOSIS — Z885 Allergy status to narcotic agent status: Secondary | ICD-10-CM | POA: Insufficient documentation

## 2019-02-01 DIAGNOSIS — S199XXA Unspecified injury of neck, initial encounter: Secondary | ICD-10-CM | POA: Diagnosis not present

## 2019-02-01 DIAGNOSIS — M25512 Pain in left shoulder: Secondary | ICD-10-CM | POA: Diagnosis not present

## 2019-02-01 DIAGNOSIS — M25561 Pain in right knee: Secondary | ICD-10-CM | POA: Diagnosis not present

## 2019-02-01 DIAGNOSIS — I5032 Chronic diastolic (congestive) heart failure: Secondary | ICD-10-CM | POA: Diagnosis present

## 2019-02-01 DIAGNOSIS — S3289XA Fracture of other parts of pelvis, initial encounter for closed fracture: Secondary | ICD-10-CM

## 2019-02-01 DIAGNOSIS — S299XXA Unspecified injury of thorax, initial encounter: Secondary | ICD-10-CM | POA: Diagnosis not present

## 2019-02-01 DIAGNOSIS — R52 Pain, unspecified: Secondary | ICD-10-CM | POA: Diagnosis not present

## 2019-02-01 DIAGNOSIS — S79912A Unspecified injury of left hip, initial encounter: Secondary | ICD-10-CM | POA: Diagnosis not present

## 2019-02-01 DIAGNOSIS — S32412A Displaced fracture of anterior wall of left acetabulum, initial encounter for closed fracture: Secondary | ICD-10-CM | POA: Diagnosis not present

## 2019-02-01 DIAGNOSIS — R0902 Hypoxemia: Secondary | ICD-10-CM | POA: Diagnosis not present

## 2019-02-01 DIAGNOSIS — S4992XA Unspecified injury of left shoulder and upper arm, initial encounter: Secondary | ICD-10-CM | POA: Diagnosis not present

## 2019-02-01 DIAGNOSIS — D72829 Elevated white blood cell count, unspecified: Secondary | ICD-10-CM | POA: Diagnosis not present

## 2019-02-01 LAB — CBC WITH DIFFERENTIAL/PLATELET
Abs Immature Granulocytes: 0.06 10*3/uL (ref 0.00–0.07)
Basophils Absolute: 0.1 10*3/uL (ref 0.0–0.1)
Basophils Relative: 1 %
Eosinophils Absolute: 0.2 10*3/uL (ref 0.0–0.5)
Eosinophils Relative: 2 %
HCT: 33 % — ABNORMAL LOW (ref 36.0–46.0)
Hemoglobin: 10.7 g/dL — ABNORMAL LOW (ref 12.0–15.0)
Immature Granulocytes: 1 %
Lymphocytes Relative: 12 %
Lymphs Abs: 1.5 10*3/uL (ref 0.7–4.0)
MCH: 31.5 pg (ref 26.0–34.0)
MCHC: 32.4 g/dL (ref 30.0–36.0)
MCV: 97.1 fL (ref 80.0–100.0)
Monocytes Absolute: 0.8 10*3/uL (ref 0.1–1.0)
Monocytes Relative: 7 %
Neutro Abs: 9.3 10*3/uL — ABNORMAL HIGH (ref 1.7–7.7)
Neutrophils Relative %: 77 %
Platelets: 136 10*3/uL — ABNORMAL LOW (ref 150–400)
RBC: 3.4 MIL/uL — ABNORMAL LOW (ref 3.87–5.11)
RDW: 13.3 % (ref 11.5–15.5)
WBC: 11.9 10*3/uL — ABNORMAL HIGH (ref 4.0–10.5)
nRBC: 0 % (ref 0.0–0.2)

## 2019-02-01 LAB — BASIC METABOLIC PANEL
Anion gap: 8 (ref 5–15)
BUN: 11 mg/dL (ref 8–23)
CO2: 27 mmol/L (ref 22–32)
Calcium: 8.7 mg/dL — ABNORMAL LOW (ref 8.9–10.3)
Chloride: 102 mmol/L (ref 98–111)
Creatinine, Ser: 0.81 mg/dL (ref 0.44–1.00)
GFR calc Af Amer: >60 mL/min (ref >60)
GFR calc non Af Amer: >60 mL/min (ref >60)
Glucose, Bld: 127 mg/dL — ABNORMAL HIGH (ref 70–99)
Potassium: 3.7 mmol/L (ref 3.5–5.1)
Sodium: 137 mmol/L (ref 135–145)

## 2019-02-01 LAB — URINALYSIS, ROUTINE W REFLEX MICROSCOPIC
Bilirubin Urine: NEGATIVE
Glucose, UA: NEGATIVE mg/dL
Hgb urine dipstick: NEGATIVE
Ketones, ur: 5 mg/dL — AB
Leukocytes,Ua: NEGATIVE
Nitrite: NEGATIVE
Protein, ur: 30 mg/dL — AB
Specific Gravity, Urine: 1.017 (ref 1.005–1.030)
pH: 7 (ref 5.0–8.0)

## 2019-02-01 LAB — PREALBUMIN: Prealbumin: 20.1 mg/dL (ref 18–38)

## 2019-02-01 LAB — CBG MONITORING, ED: Glucose-Capillary: 144 mg/dL — ABNORMAL HIGH (ref 70–99)

## 2019-02-01 MED ORDER — ACETAMINOPHEN 500 MG PO TABS
500.0000 mg | ORAL_TABLET | Freq: Three times a day (TID) | ORAL | Status: DC
  Administered 2019-02-01 – 2019-02-02 (×3): 500 mg via ORAL
  Filled 2019-02-01 (×3): qty 1

## 2019-02-01 MED ORDER — ONDANSETRON HCL 4 MG PO TABS: 4.0000 mg | ORAL_TABLET | Freq: Four times a day (QID) | ORAL | Status: DC | PRN

## 2019-02-01 MED ORDER — CALCIUM CARBONATE-VITAMIN D 500-200 MG-UNIT PO TABS
1.0000 | ORAL_TABLET | Freq: Two times a day (BID) | ORAL | Status: DC
  Administered 2019-02-01 – 2019-02-02 (×2): 1 via ORAL
  Filled 2019-02-01 (×2): qty 1

## 2019-02-01 MED ORDER — SODIUM CHLORIDE 0.9% FLUSH
3.0000 mL | Freq: Two times a day (BID) | INTRAVENOUS | Status: DC
  Administered 2019-02-01 – 2019-02-02 (×2): 3 mL via INTRAVENOUS

## 2019-02-01 MED ORDER — MORPHINE SULFATE (PF) 4 MG/ML IV SOLN
4.0000 mg | Freq: Once | INTRAVENOUS | Status: DC
  Filled 2019-02-01: qty 1

## 2019-02-01 MED ORDER — SODIUM CHLORIDE 0.9 % IV SOLN: 250.0000 mL | INTRAVENOUS | Status: DC | PRN

## 2019-02-01 MED ORDER — METOCLOPRAMIDE HCL 5 MG/ML IJ SOLN
10.0000 mg | Freq: Once | INTRAMUSCULAR | Status: AC
  Administered 2019-02-01: 10 mg via INTRAVENOUS
  Filled 2019-02-01: qty 2

## 2019-02-01 MED ORDER — ASPIRIN EC 81 MG PO TBEC
81.0000 mg | DELAYED_RELEASE_TABLET | Freq: Every day | ORAL | Status: DC
  Administered 2019-02-01 – 2019-02-02 (×2): 81 mg via ORAL
  Filled 2019-02-01 (×2): qty 1

## 2019-02-01 MED ORDER — ONDANSETRON HCL 4 MG/2ML IJ SOLN: 4.0000 mg | Freq: Four times a day (QID) | INTRAMUSCULAR | Status: DC | PRN

## 2019-02-01 MED ORDER — VITAMIN D 25 MCG (1000 UNIT) PO TABS
2000.0000 [IU] | ORAL_TABLET | Freq: Two times a day (BID) | ORAL | Status: DC
  Administered 2019-02-01 – 2019-02-02 (×2): 2000 [IU] via ORAL
  Filled 2019-02-01 (×2): qty 2

## 2019-02-01 MED ORDER — DOCUSATE SODIUM 100 MG PO CAPS
100.0000 mg | ORAL_CAPSULE | Freq: Two times a day (BID) | ORAL | Status: DC
  Administered 2019-02-01 – 2019-02-02 (×3): 100 mg via ORAL
  Filled 2019-02-01 (×3): qty 1

## 2019-02-01 MED ORDER — VITAMIN C 500 MG PO TABS
500.0000 mg | ORAL_TABLET | Freq: Every day | ORAL | Status: DC
  Administered 2019-02-01 – 2019-02-02 (×2): 500 mg via ORAL
  Filled 2019-02-01 (×2): qty 1

## 2019-02-01 MED ORDER — LOPERAMIDE HCL 2 MG PO CAPS: 2.0000 mg | ORAL_CAPSULE | ORAL | Status: DC | PRN

## 2019-02-01 MED ORDER — NITROGLYCERIN 0.4 MG SL SUBL: 0.4000 mg | SUBLINGUAL_TABLET | SUBLINGUAL | Status: DC | PRN

## 2019-02-01 MED ORDER — ONDANSETRON HCL 4 MG/2ML IJ SOLN
4.0000 mg | Freq: Once | INTRAMUSCULAR | Status: AC
  Administered 2019-02-01: 4 mg via INTRAVENOUS
  Filled 2019-02-01: qty 2

## 2019-02-01 MED ORDER — ENOXAPARIN SODIUM 30 MG/0.3ML ~~LOC~~ SOLN
30.0000 mg | SUBCUTANEOUS | Status: DC
  Administered 2019-02-01: 30 mg via SUBCUTANEOUS
  Filled 2019-02-01: qty 0.3

## 2019-02-01 MED ORDER — ALBUTEROL SULFATE HFA 108 (90 BASE) MCG/ACT IN AERS: 2.0000 | INHALATION_SPRAY | Freq: Two times a day (BID) | RESPIRATORY_TRACT | Status: DC | PRN

## 2019-02-01 MED ORDER — LORAZEPAM 2 MG/ML IJ SOLN
0.5000 mg | Freq: Once | INTRAMUSCULAR | Status: AC
  Administered 2019-02-01: 0.5 mg via INTRAVENOUS
  Filled 2019-02-01: qty 1

## 2019-02-01 MED ORDER — ALBUTEROL SULFATE (2.5 MG/3ML) 0.083% IN NEBU: 2.5000 mg | INHALATION_SOLUTION | Freq: Four times a day (QID) | RESPIRATORY_TRACT | Status: DC | PRN

## 2019-02-01 MED ORDER — BUDESONIDE 0.5 MG/2ML IN SUSP
0.5000 mg | Freq: Two times a day (BID) | RESPIRATORY_TRACT | Status: DC
  Filled 2019-02-01: qty 2

## 2019-02-01 MED ORDER — FLUTICASONE PROPIONATE HFA 220 MCG/ACT IN AERO: 1.0000 | INHALATION_SPRAY | Freq: Every day | RESPIRATORY_TRACT | Status: DC | PRN

## 2019-02-01 MED ORDER — VITAMIN D3 25 MCG (1000 UT) PO CAPS: 1000.0000 [IU] | ORAL_CAPSULE | Freq: Every day | ORAL | Status: DC

## 2019-02-01 MED ORDER — METOPROLOL TARTRATE 12.5 MG HALF TABLET
12.5000 mg | ORAL_TABLET | Freq: Two times a day (BID) | ORAL | Status: DC
  Administered 2019-02-02: 12.5 mg via ORAL
  Filled 2019-02-01 (×2): qty 1

## 2019-02-01 MED ORDER — ROSUVASTATIN CALCIUM 5 MG PO TABS
10.0000 mg | ORAL_TABLET | Freq: Every day | ORAL | Status: DC
  Administered 2019-02-01: 10 mg via ORAL
  Filled 2019-02-01: qty 2

## 2019-02-01 MED ORDER — TRAMADOL HCL 50 MG PO TABS: 25.0000 mg | ORAL_TABLET | Freq: Four times a day (QID) | ORAL | Status: DC | PRN

## 2019-02-01 MED ORDER — SODIUM CHLORIDE 0.9% FLUSH: 3.0000 mL | INTRAVENOUS | Status: DC | PRN

## 2019-02-01 MED ORDER — FENTANYL CITRATE (PF) 100 MCG/2ML IJ SOLN
50.0000 ug | Freq: Once | INTRAMUSCULAR | Status: AC
  Administered 2019-02-01: 50 ug via INTRAVENOUS
  Filled 2019-02-01: qty 2

## 2019-02-01 NOTE — ED Notes (Signed)
ED TO INPATIENT HANDOFF REPORT  ED Nurse Name and Phone #: Joellen Jersey [phone]  S Name/Age/Gender Amy Kaiser 85 y.o. female Room/Bed: 022C/022C  Code Status   Code Status: Prior  Home/SNF/Other Home Patient oriented to: self, place, time and situation Is this baseline? Yes   Triage Complete: Triage complete  Chief Complaint Fall, hip pain  Triage Note Pt coming in by EMS after falling yesterday during the early evening. EMS came on site yesterday but pt refused to seek medical care at hospital, so they helped assist her back to bed. Due to being unable to move and the pain, the pt called EMS again. Pain is in left posterior hip. Hx of UTI, recently finished up antibiotics. Hit head yesterday, no LOC per pt. Pt on Aspirin. 92% on RA, 97% w/ 2 L [place] by EMS   Allergies Allergies  Allergen Reactions  . Latex Hives, Itching and Other (See Comments)    Caused blisters in her mouth  . Lisinopril Other (See Comments)    Weakness   . Penicillins Other (See Comments)    Intolerance to ALL "CILLINS" > UNSPECIFIED REACTIONS  Has patient had a PCN reaction causing immediate rash, facial/tongue/throat swelling, SOB or lightheadedness with hypotension: UNSPECIFIED REACTION  Has patient had a PCN reaction causing severe rash involving mucus membranes or skin necrosis: UNSPECIFIED REACTION  Has patient had a PCN reaction that required hospitalization UNSPECIFIED REACTION  Has patient had a PCN reaction occurring within the last 10 years: UNSPECIFIED REACTION   . Pravachol [Pravastatin Sodium] Other (See Comments)    MYALGIAs LEG PAIN   . Alendronate Sodium Other (See Comments)    UNSPECIFIED REACTION  [Patient denies this allergy]   . Codeine Nausea And Vomiting  . Morphine And Related Nausea And Vomiting  . Prozac [Fluoxetine Hcl] Anxiety    "FEELS SHAKY"   . Symbicort [Budesonide-Formoterol Fumarate] Anxiety    "SHAKY"   . Wellbutrin [Bupropion] Anxiety    "JITTERY"      Level of Care/Admitting Diagnosis ED Disposition    ED Disposition Condition [facility] Area: [place] [[id number]]  Level of Care: Med-Surg [16]  I expect the patient will be discharged within 24 hours: Yes  LOW acuity---Tx typically complete <24 hrs---ACUTE conditions typically can be evaluated <24 hours---LABS likely to return to acceptable levels <24 hours---IS near functional baseline---EXPECTED to return to current living arrangement---NOT newly hypoxic: Meets criteria for 5C-Observation unit  Diagnosis: Pelvis fracture [facility]) [[id number]]  Admitting Physician: Elmarie Shiley [phone]  Attending Physician: Niel Hummer A [[id number]  PT Class (Do Not Modify): Observation [104]  PT Acc Code (Do Not Modify): Observation [10022]       B Medical/Surgery History Past Medical History:  Diagnosis Date  . Anemia   . Anxiety   . Arthritis    "just about all over" (03/11/2017)  . Asthma   . Cervical cancer ([place])   . COPD (chronic obstructive pulmonary disease) ([place])   . Coronary artery disease   . Depression   . Diabetes mellitus without complication ([place])    pt denies this hx on 03/11/2017, patient denies this states she has low blood sugar  . Heart murmur   . Hyperlipidemia   . Low blood sugar   . Migraine    "work related; quit when I quit work in ~ 1997" (03/11/2017)  . Skin cancer    back   Past Surgical History:  Procedure Laterality Date  .  APPENDECTOMY    . CARDIAC CATHETERIZATION  02/2017  . CATARACT EXTRACTION W/ INTRAOCULAR LENS  IMPLANT, BILATERAL Bilateral   . CORONARY ANGIOPLASTY WITH STENT PLACEMENT  03/11/2017  . CORONARY STENT INTERVENTION N/A 03/11/2017   Procedure: Coronary Stent Intervention;  Surgeon: Sherren Mocha, MD;  Location: [place] [facility];  Service: Cardiovascular;  Laterality: N/A;  . IR RADIOLOGY PERIPHERAL GUIDED IV START  02/26/2017  . IR US GUIDE VASC ACCESS RIGHT  02/26/2017  . RIGHT/LEFT HEART CATH AND  CORONARY ANGIOGRAPHY N/A 02/20/2017   Procedure: Right/Left Heart Cath and Coronary Angiography;  Surgeon: Sherren Mocha, MD;  Location: [place] [facility];  Service: Cardiovascular;  Laterality: N/A;  . SKIN CANCER EXCISION     "back"  . TEE WITHOUT CARDIOVERSION N/A 04/09/2017   Procedure: TRANSESOPHAGEAL ECHOCARDIOGRAM (TEE);  Surgeon: Sherren Mocha, MD;  Location: [place];  Service: Open Heart Surgery;  Laterality: N/A;  . TRANSCATHETER AORTIC VALVE REPLACEMENT, TRANSFEMORAL N/A 04/09/2017   Procedure: TRANSCATHETER AORTIC VALVE REPLACEMENT, TRANSFEMORAL;  Surgeon: Sherren Mocha, MD;  Location: [place];  Service: Open Heart Surgery;  Laterality: N/A;  . VAGINAL HYSTERECTOMY       A IV Location/Drains/Wounds Patient Lines/Drains/Airways Status   Active Line/Drains/Airways    Name:   Placement date:   Placement time:   Site:   Days:   Peripheral IV 02/01/19 Left Hand   02/01/19    0708    Hand   less than 1   Incision (Closed) 04/09/17 Groin Left   04/09/17    0828     663   Incision (Closed) 04/09/17 Groin Right   04/09/17    0828     663          Intake/Output Last 24 hours No intake or output data in the 24 hours ending 02/01/19 1206  Labs/Imaging Results for orders placed or performed during the hospital encounter of 02/01/19 (from the past 48 hour(s))  Basic metabolic panel     Status: Abnormal   Collection Time: 02/01/19  7:41 AM  Result Value Ref Range   Sodium 137 135 - 145 mmol/L   Potassium 3.7 3.5 - 5.1 mmol/L   Chloride 102 98 - 111 mmol/L   CO2 27 22 - 32 mmol/L   Glucose, Bld 127 (H) 70 - 99 mg/dL   BUN 11 8 - 23 mg/dL   Creatinine, Ser 0.81 0.44 - 1.00 mg/dL   Calcium 8.7 (L) 8.9 - 10.3 mg/dL   GFR calc non Af Amer >60 >60 mL/min   GFR calc Af Amer >60 >60 mL/min   Anion gap 8 5 - 15    Comment: Performed at [facility], [place] [address] [place], [place] [address]  CBC with Differential     Status: Abnormal   Collection Time: 02/01/19  7:41 AM  Result  Value Ref Range   WBC 11.9 (H) 4.0 - 10.5 K/uL   RBC 3.40 (L) 3.87 - 5.11 MIL/uL   Hemoglobin 10.7 (L) 12.0 - 15.0 g/dL   HCT 33.0 (L) 36.0 - 46.0 %   MCV 97.1 80.0 - 100.0 fL   MCH 31.5 26.0 - 34.0 pg   MCHC 32.4 30.0 - 36.0 g/dL   RDW 13.3 11.5 - 15.5 %   Platelets 136 (L) 150 - 400 K/uL    Comment: PLATELET COUNT CONFIRMED BY SMEAR SPECIMEN CHECKED FOR CLOTS    nRBC 0.0 0.0 - 0.2 %   Neutrophils Relative % 77 %  Neutro Abs 9.3 (H) 1.7 - 7.7 K/uL   Lymphocytes Relative 12 %   Lymphs Abs 1.5 0.7 - 4.0 K/uL   Monocytes Relative 7 %   Monocytes Absolute 0.8 0.1 - 1.0 K/uL   Eosinophils Relative 2 %   Eosinophils Absolute 0.2 0.0 - 0.5 K/uL   Basophils Relative 1 %   Basophils Absolute 0.1 0.0 - 0.1 K/uL   Immature Granulocytes 1 %   Abs Immature Granulocytes 0.06 0.00 - 0.07 K/uL    Comment: Performed at [facility] [address], [place], [place] [address]  CBG monitoring, ED     Status: Abnormal   Collection Time: 02/01/19  8:52 AM  Result Value Ref Range   Glucose-Capillary 144 (H) 70 - 99 mg/dL   Dg Chest 1 View  Result Date: 02/01/2019 CLINICAL DATA:  Fall, left posterior hip pain EXAM: CHEST  1 VIEW COMPARISON:  04/10/2017 FINDINGS: Left basilar opacity, chronic, likely reflecting scarring. Possible trace left pleural effusion. Right lung is clear. No pneumothorax. The heart is normal in size.  Prosthetic aortic valve. IMPRESSION: Possible trace left pleural effusion. Chronic left basilar opacity/scarring. Electronically Signed   By: Julian Hy M.D.   On: 02/01/2019 09:46   Dg Elbow Complete Left  Result Date: 02/01/2019 CLINICAL DATA:  Fall, pain EXAM: LEFT ELBOW - COMPLETE 3+ VIEW COMPARISON:  None. FINDINGS: No fracture or dislocation is seen. The joint spaces are preserved. Visualized soft tissues are within normal limits. No displaced elbow joint fat pads to suggest an elbow joint effusion. IMPRESSION: Negative. Electronically Signed   By: Julian Hy M.D.   On: 02/01/2019 09:45   Ct Head Wo Contrast  Result Date: 02/01/2019 CLINICAL DATA:  Head injury after fall.  No loss of consciousness. EXAM: CT HEAD WITHOUT CONTRAST CT CERVICAL SPINE WITHOUT CONTRAST TECHNIQUE: Multidetector CT imaging of the head and cervical spine was performed following the standard protocol without intravenous contrast. Multiplanar CT image reconstructions of the cervical spine were also generated. COMPARISON:  CT scan of May 18, 2015. FINDINGS: CT HEAD FINDINGS Brain: Mild diffuse cortical atrophy is noted. No mass effect or midline shift is noted. Ventricular size is within normal limits. There is no evidence of mass lesion, hemorrhage or acute infarction. Vascular: No hyperdense vessel or unexpected calcification. Skull: Normal. Negative for fracture or focal lesion. Sinuses/Orbits: No acute finding. Other: None. CT CERVICAL SPINE FINDINGS Alignment: Minimal grade 1 anterolisthesis of C3-4 is noted secondary to posterior facet joint hypertrophy. Skull base and vertebrae: No acute fracture. No primary bone lesion or focal pathologic process. Soft tissues and spinal canal: No prevertebral fluid or swelling. No visible canal hematoma. Disc levels: Severe degenerative disc disease is noted at C4-5 with anterior posterior osteophyte formation. Moderate degenerative disc disease is noted at C5-6 and C6-7. Upper chest: Negative. Other: Degenerative changes are seen involving posterior facet joints bilaterally. IMPRESSION: Mild diffuse cortical atrophy. No acute intracranial abnormality seen. Moderate to severe multilevel degenerative disc disease is noted in the cervical spine. No fracture or other acute abnormality is noted. Electronically Signed   By: Marijo Conception, M.D.   On: 02/01/2019 10:04   Ct Cervical Spine Wo Contrast  Result Date: 02/01/2019 CLINICAL DATA:  Head injury after fall.  No loss of consciousness. EXAM: CT HEAD WITHOUT CONTRAST CT CERVICAL SPINE  WITHOUT CONTRAST TECHNIQUE: Multidetector CT imaging of the head and cervical spine was performed following the standard protocol without intravenous contrast. Multiplanar CT image  reconstructions of the cervical spine were also generated. COMPARISON:  CT scan of May 18, 2015. FINDINGS: CT HEAD FINDINGS Brain: Mild diffuse cortical atrophy is noted. No mass effect or midline shift is noted. Ventricular size is within normal limits. There is no evidence of mass lesion, hemorrhage or acute infarction. Vascular: No hyperdense vessel or unexpected calcification. Skull: Normal. Negative for fracture or focal lesion. Sinuses/Orbits: No acute finding. Other: None. CT CERVICAL SPINE FINDINGS Alignment: Minimal grade 1 anterolisthesis of C3-4 is noted secondary to posterior facet joint hypertrophy. Skull base and vertebrae: No acute fracture. No primary bone lesion or focal pathologic process. Soft tissues and spinal canal: No prevertebral fluid or swelling. No visible canal hematoma. Disc levels: Severe degenerative disc disease is noted at C4-5 with anterior posterior osteophyte formation. Moderate degenerative disc disease is noted at C5-6 and C6-7. Upper chest: Negative. Other: Degenerative changes are seen involving posterior facet joints bilaterally. IMPRESSION: Mild diffuse cortical atrophy. No acute intracranial abnormality seen. Moderate to severe multilevel degenerative disc disease is noted in the cervical spine. No fracture or other acute abnormality is noted. Electronically Signed   By: Marijo Conception, M.D.   On: 02/01/2019 10:04   Ct Hip Left Wo Contrast  Result Date: 02/01/2019 CLINICAL DATA:  Left hip pain after fall EXAM: CT OF THE LEFT HIP WITHOUT CONTRAST TECHNIQUE: Multidetector CT imaging of the left hip was performed according to the standard protocol. Multiplanar CT image reconstructions were also generated. COMPARISON:  02/01/2019 radiographs FINDINGS: Bones/Joint/Cartilage Acute vertical  fracture of the left sacral ala. Acute fracture of the anterior wall left acetabulum involving the lateral attachment of the superior pubic ramus. Vertically oriented fracture of the left pubic body extending through the superior and inferior pubic ramus margins. I would do not see a discrete fracture the left proximal femur. The appearance suggesting intratrochanteric fracture seems to most likely be due to spurring. Ligaments Suboptimally assessed by CT. Muscles and Tendons There is likely some mild expansion of the left piriformis muscle due to mild hematoma. Soft tissues Abdominal aortic atherosclerotic calcification. Sigmoid colon diverticulosis. Small direct left inguinal hernia contains adipose tissue. IMPRESSION: 1. Left pelvic fractures include a vertical fracture of the left sacral ala; acute fracture of the anterior wall of the left acetabulum extending into the lateral attachment of the superior pubic ramus; and a vertically oriented fracture of the left pubic body. 2. I do not see a definite proximal femoral fracture on the left. The appearance of possible intertrochanteric fracture on radiography was probably caused by spurring rather than a femoral fracture. 3. Mild hematoma within or along the left piriformis muscle, not causing significant impingement at the sciatic notch. 4.  Aortic Atherosclerosis (ICD10-I70.0). 5. Sigmoid colon diverticulosis. 6. Small left groin hernia containing adipose tissue. Electronically Signed   By: Van Clines M.D.   On: 02/01/2019 11:05   Dg Shoulder Left  Result Date: 02/01/2019 CLINICAL DATA:  Fall, pain EXAM: LEFT SHOULDER - 2+ VIEW COMPARISON:  None. FINDINGS: No fracture or dislocation is seen. The joint spaces are preserved. Visualized soft tissues are within normal limits. Visualized left lung is clear. IMPRESSION: Negative. Electronically Signed   By: Julian Hy M.D.   On: 02/01/2019 09:45   Dg Knee Complete 4 Views Right  Result Date:  02/01/2019 CLINICAL DATA:  Fall, pain EXAM: RIGHT KNEE - COMPLETE 4+ VIEW COMPARISON:  None. FINDINGS: No fracture or dislocation is seen. The joint spaces are preserved. The visualized soft tissues are  unremarkable. No suprapatellar knee joint effusion. IMPRESSION: Negative. Electronically Signed   By: Julian Hy M.D.   On: 02/01/2019 09:42   Dg Hip Unilat W Or Wo Pelvis 2-3 Views Left  Result Date: 02/01/2019 CLINICAL DATA:  Fall, left posterior hip pain EXAM: DG HIP (WITH OR WITHOUT PELVIS) 2-3V LEFT COMPARISON:  None. FINDINGS: Nondisplaced fracture at least involving the greater trochanter of the left hip. However, subtle lucency through the femoral neck favors a nondisplaced intertrochanteric left hip fracture. Bilateral hip joint spaces are preserved. Visualized bony pelvis appears intact. Degenerative changes of the lower lumbar spine. IMPRESSION: Suspected nondisplaced intertrochanteric left hip fracture, as less likely nondisplaced fracture of the left greater trochanter. Electronically Signed   By: Julian Hy M.D.   On: 02/01/2019 09:44    Pending Labs Unresulted Labs (From admission, onward)    Start     Ordered   02/01/19 1200  Urinalysis, Routine w reflex microscopic  ONCE - STAT,   STAT     02/01/19 1159          Vitals/Pain Today's Vitals   02/01/19 0745 02/01/19 0812 02/01/19 1000 02/01/19 1030  BP: (!) 151/72  (!) 133/54 (!) 136/56  Pulse: 89  85 89  Resp: (!) 22  18 17   Temp:      TempSrc:      SpO2: 96%  93% 99%  Weight:      Height:      PainSc:  5       Isolation Precautions No active isolations  Medications Medications  fentaNYL (SUBLIMAZE) injection 50 mcg (50 mcg Intravenous Given 02/01/19 0749)  ondansetron (ZOFRAN) injection 4 mg (4 mg Intravenous Given 02/01/19 0811)  metoCLOPramide (REGLAN) injection 10 mg (10 mg Intravenous Given 02/01/19 0824)  LORazepam (ATIVAN) injection 0.5 mg (0.5 mg Intravenous Given 02/01/19 0901)     Mobility walks with device High fall risk   Focused Assessments Musculoskeletal    R Recommendations: See Admitting Provider Note  Report given to:   Additional Notes:

## 2019-02-01 NOTE — ED Notes (Signed)
Per Cristie Hem - PA, attempted to stand pt up. Pt stated that she could NOT stand. Pt attempted to move off of the bed, without success. Pt stated that she wanted to go home. Informed Alex - PA.

## 2019-02-01 NOTE — ED Provider Notes (Addendum)
[facility] EMERGENCY DEPARTMENT Provider Note   CSN: [id number] Arrival date & time: 02/01/19  5638    History   Chief Complaint Chief Complaint  Patient presents with   Fall    HPI LINLEY MOSKAL is a 85 y.o. female with history of CAD, COPD, diabetes, hypertension, CHF, aortic valve replacement who presents with left hip pain and low back pain after fall yesterday.  Patient was outside picking flowers when she tripped over something in the garden.  She hit her head briefly, but did not lose consciousness.  Patient reports most pain in her left hip.  She has not been bearing weight since.  She called EMS yesterday who brought her into her house, but she declined transport at that time.  She was in the bed since then.  She had pain all night.  She also poured some mild pain in her left elbow and left shoulder.  She reports acute on chronic pain in her right knee.  She reports she has had pain there before, but it has gotten worse after the fall.  She is on aspirin, but no other anticoagulation.  Patient denies any numbness or tingling. No medications taken PTA. NPO since supper yesterday.    HPI  Past Medical History:  Diagnosis Date   Anemia    Anxiety    Arthritis    "just about all over" (03/11/2017)   Asthma    Cervical cancer ([place])    COPD (chronic obstructive pulmonary disease) ([place])    Coronary artery disease    Depression    Diabetes mellitus without complication ([place])    pt denies this hx on 03/11/2017, patient denies this states she has low blood sugar   Heart murmur    Hyperlipidemia    Low blood sugar    Migraine    "work related; quit when I quit work in ~ 1997" (03/11/2017)   Skin cancer    back    Patient Active Problem List   Diagnosis Date Noted   Pelvis fracture ([place]) 02/01/2019   Essential hypertension 10/06/2018   Chronic diastolic CHF (congestive heart failure) ([place]) 10/06/2018   S/P TAVR (transcatheter aortic  valve replacement) 04/09/2017   Coronary artery disease with exertional angina ([place]) 03/11/2017   Severe aortic stenosis 02/20/2017    Past Surgical History:  Procedure Laterality Date   APPENDECTOMY     CARDIAC CATHETERIZATION  02/2017   CATARACT EXTRACTION W/ INTRAOCULAR LENS  IMPLANT, BILATERAL Bilateral    CORONARY ANGIOPLASTY WITH STENT PLACEMENT  03/11/2017   CORONARY STENT INTERVENTION N/A 03/11/2017   Procedure: Coronary Stent Intervention;  Surgeon: Sherren Mocha, MD;  Location: [facility];  Service: Cardiovascular;  Laterality: N/A;   IR RADIOLOGY PERIPHERAL GUIDED IV START  02/26/2017   IR US GUIDE VASC ACCESS RIGHT  02/26/2017   RIGHT/LEFT HEART CATH AND CORONARY ANGIOGRAPHY N/A 02/20/2017   Procedure: Right/Left Heart Cath and Coronary Angiography;  Surgeon: Sherren Mocha, MD;  Location: [facility];  Service: Cardiovascular;  Laterality: N/A;   SKIN CANCER EXCISION     "back"   TEE WITHOUT CARDIOVERSION N/A 04/09/2017   Procedure: TRANSESOPHAGEAL ECHOCARDIOGRAM (TEE);  Surgeon: Sherren Mocha, MD;  Location: [facility];  Service: Open Heart Surgery;  Laterality: N/A;   TRANSCATHETER AORTIC VALVE REPLACEMENT, TRANSFEMORAL N/A 04/09/2017   Procedure: TRANSCATHETER AORTIC VALVE REPLACEMENT, TRANSFEMORAL;  Surgeon: Sherren Mocha, MD;  Location: [facility];  Service: Open Heart Surgery;  Laterality: N/A;   VAGINAL  HYSTERECTOMY       OB History   No obstetric history on file.      Home Medications    Prior to Admission medications   Medication Sig Start Date End Date Taking? Authorizing Provider  aspirin EC 81 MG tablet Take 81 mg by mouth daily.    [provider]  calcium-vitamin D (OSCAL WITH D) 500-200 MG-UNIT tablet Take 1 tablet by mouth 2 (two) times daily.    [provider]  Cholecalciferol (VITAMIN D3) 1000 units CAPS Take 1,000 Units by mouth daily with lunch.     [provider]  estradiol (ESTRACE VAGINAL) 0.1  MG/GM vaginal cream Apply 0.5mg  (pea-sized amount)  just inside the vaginal introitus with a finger-tip on Monday, Wednesday and Friday nights. Patient not taking: Reported on 12/25/2018 09/23/18   Zara Council A, PA-C  FLOVENT HFA 220 MCG/ACT inhaler Take 1 puff by mouth daily as needed (wheezing).  04/07/15   [provider]  loperamide (IMODIUM A-D) 2 MG tablet Take 2 mg by mouth as needed for diarrhea or loose stools.    [provider]  metoprolol tartrate (LOPRESSOR) 25 MG tablet TAKE 1/2 (ONE-HALF) TABLET BY MOUTH TWICE DAILY 10/21/18   Sherren Mocha, MD  Multiple Vitamins-Minerals (ICAPS AREDS 2 PO) Take 1 capsule by mouth 2 (two) times daily.    [provider]  nitroGLYCERIN (NITROSTAT) 0.4 MG SL tablet Place 1 tablet (0.4 mg total) under the tongue every 5 (five) minutes x 3 doses as needed for chest pain. 04/30/18   Sherren Mocha, MD  rosuvastatin (CRESTOR) 10 MG tablet Take 10 mg by mouth daily with supper.     [provider]  VENTOLIN HFA 108 (90 BASE) MCG/ACT inhaler Inhale 2 puffs into the lungs 2 (two) times daily as needed for wheezing or shortness of breath.  03/14/15   [provider]    Family History History reviewed. No pertinent family history.  Social History Social History   Tobacco Use   Smoking status: Never Smoker   Smokeless tobacco: Never Used  Substance Use Topics   Alcohol use: No   Drug use: No     Allergies   Latex; Lisinopril; Penicillins; Pravachol [pravastatin sodium]; Alendronate sodium; Codeine; Morphine and related; Prozac [fluoxetine hcl]; Symbicort [budesonide-formoterol fumarate]; and Wellbutrin [bupropion]   Review of Systems Review of Systems  Constitutional: Negative for chills and fever.  HENT: Negative for facial swelling and sore throat.   Respiratory: Negative for shortness of breath.   Cardiovascular: Negative for chest pain.  Gastrointestinal: Negative for abdominal pain,  nausea and vomiting.  Genitourinary: Negative for dysuria.  Musculoskeletal: Positive for arthralgias, back pain and neck pain.  Skin: Negative for rash and wound.  Neurological: Negative for syncope, numbness and headaches.  Psychiatric/Behavioral: The patient is not nervous/anxious.      Physical Exam Updated Vital Signs BP (!) 136/56    Pulse 89    Temp 99.4 F (37.4 C) (Oral)    Resp 17    Ht 5\' 7"  (1.702 m)    Wt 68 kg    SpO2 99%    BMI 23.49 kg/m   Physical Exam Vitals signs and nursing note reviewed.  Constitutional:      General: She is not in acute distress.    Appearance: She is well-developed. She is not diaphoretic.  HENT:     Head: Normocephalic and atraumatic.     Mouth/Throat:     Pharynx: No oropharyngeal exudate.  Eyes:     General: No scleral icterus.       Right eye: No discharge.        Left eye: No discharge.     Conjunctiva/sclera: Conjunctivae normal.     Pupils: Pupils are equal, round, and reactive to light.  Neck:     Musculoskeletal: Normal range of motion and neck supple.     Thyroid: No thyromegaly.  Cardiovascular:     Rate and Rhythm: Normal rate and regular rhythm.     Heart sounds: Normal heart sounds. No murmur. No friction rub. No gallop.   Pulmonary:     Effort: Pulmonary effort is normal. No respiratory distress.     Breath sounds: Normal breath sounds. No stridor. No wheezing or rales.  Abdominal:     General: Bowel sounds are normal. There is no distension.     Palpations: Abdomen is soft.     Tenderness: There is no abdominal tenderness. There is no guarding or rebound.  Musculoskeletal:     Comments: Significant tenderness to the left posterior hip and lumbar spine; mild tenderness to the midline cervical and thoracic spine Mild tenderness to the left elbow and left shoulder; no deformity noted Mild tenderness of the right knee  Lymphadenopathy:     Cervical: No cervical adenopathy.  Skin:    General: Skin is warm and dry.      Coloration: Skin is not pale.     Findings: No rash.  Neurological:     Mental Status: She is alert.     Coordination: Coordination normal.     Comments: Normal sensation to bilateral lower extremities; range of motion limited lower extremities so strength not assessed      ED Treatments / Results  Labs (all labs ordered are listed, but only abnormal results are displayed) Labs Reviewed  BASIC METABOLIC PANEL - Abnormal; Notable for the following components:      Result Value   Glucose, Bld 127 (*)    Calcium 8.7 (*)    All other components within normal limits  CBC WITH DIFFERENTIAL/PLATELET - Abnormal; Notable for the following components:   WBC 11.9 (*)    RBC 3.40 (*)    Hemoglobin 10.7 (*)    HCT 33.0 (*)    Platelets 136 (*)    Neutro Abs 9.3 (*)    All other components within normal limits  CBG MONITORING, ED - Abnormal; Notable for the following components:   Glucose-Capillary 144 (*)    All other components within normal limits  URINALYSIS, ROUTINE W REFLEX MICROSCOPIC    EKG None  Radiology Dg Chest 1 View  Result Date: 02/01/2019 CLINICAL DATA:  Fall, left posterior hip pain EXAM: CHEST  1 VIEW COMPARISON:  04/10/2017 FINDINGS: Left basilar opacity, chronic, likely reflecting scarring. Possible trace left pleural effusion. Right lung is clear. No pneumothorax. The heart is normal in size.  Prosthetic aortic valve. IMPRESSION: Possible trace left pleural effusion. Chronic left basilar opacity/scarring. Electronically Signed   By: Julian Hy M.D.   On: 02/01/2019 09:46   Dg Elbow Complete Left  Result Date: 02/01/2019 CLINICAL DATA:  Fall, pain EXAM: LEFT ELBOW - COMPLETE 3+ VIEW COMPARISON:  None. FINDINGS: No fracture or dislocation is seen. The joint spaces are preserved. Visualized soft tissues are within normal limits. No displaced elbow joint fat pads to suggest an elbow joint effusion. IMPRESSION: Negative. Electronically Signed   By: Julian Hy M.D.   On: 02/01/2019 09:45  Ct Head Wo Contrast  Result Date: 02/01/2019 CLINICAL DATA:  Head injury after fall.  No loss of consciousness. EXAM: CT HEAD WITHOUT CONTRAST CT CERVICAL SPINE WITHOUT CONTRAST TECHNIQUE: Multidetector CT imaging of the head and cervical spine was performed following the standard protocol without intravenous contrast. Multiplanar CT image reconstructions of the cervical spine were also generated. COMPARISON:  CT scan of May 18, 2015. FINDINGS: CT HEAD FINDINGS Brain: Mild diffuse cortical atrophy is noted. No mass effect or midline shift is noted. Ventricular size is within normal limits. There is no evidence of mass lesion, hemorrhage or acute infarction. Vascular: No hyperdense vessel or unexpected calcification. Skull: Normal. Negative for fracture or focal lesion. Sinuses/Orbits: No acute finding. Other: None. CT CERVICAL SPINE FINDINGS Alignment: Minimal grade 1 anterolisthesis of C3-4 is noted secondary to posterior facet joint hypertrophy. Skull base and vertebrae: No acute fracture. No primary bone lesion or focal pathologic process. Soft tissues and spinal canal: No prevertebral fluid or swelling. No visible canal hematoma. Disc levels: Severe degenerative disc disease is noted at C4-5 with anterior posterior osteophyte formation. Moderate degenerative disc disease is noted at C5-6 and C6-7. Upper chest: Negative. Other: Degenerative changes are seen involving posterior facet joints bilaterally. IMPRESSION: Mild diffuse cortical atrophy. No acute intracranial abnormality seen. Moderate to severe multilevel degenerative disc disease is noted in the cervical spine. No fracture or other acute abnormality is noted. Electronically Signed   By: Marijo Conception, M.D.   On: 02/01/2019 10:04   Ct Cervical Spine Wo Contrast  Result Date: 02/01/2019 CLINICAL DATA:  Head injury after fall.  No loss of consciousness. EXAM: CT HEAD WITHOUT CONTRAST CT CERVICAL SPINE  WITHOUT CONTRAST TECHNIQUE: Multidetector CT imaging of the head and cervical spine was performed following the standard protocol without intravenous contrast. Multiplanar CT image reconstructions of the cervical spine were also generated. COMPARISON:  CT scan of May 18, 2015. FINDINGS: CT HEAD FINDINGS Brain: Mild diffuse cortical atrophy is noted. No mass effect or midline shift is noted. Ventricular size is within normal limits. There is no evidence of mass lesion, hemorrhage or acute infarction. Vascular: No hyperdense vessel or unexpected calcification. Skull: Normal. Negative for fracture or focal lesion. Sinuses/Orbits: No acute finding. Other: None. CT CERVICAL SPINE FINDINGS Alignment: Minimal grade 1 anterolisthesis of C3-4 is noted secondary to posterior facet joint hypertrophy. Skull base and vertebrae: No acute fracture. No primary bone lesion or focal pathologic process. Soft tissues and spinal canal: No prevertebral fluid or swelling. No visible canal hematoma. Disc levels: Severe degenerative disc disease is noted at C4-5 with anterior posterior osteophyte formation. Moderate degenerative disc disease is noted at C5-6 and C6-7. Upper chest: Negative. Other: Degenerative changes are seen involving posterior facet joints bilaterally. IMPRESSION: Mild diffuse cortical atrophy. No acute intracranial abnormality seen. Moderate to severe multilevel degenerative disc disease is noted in the cervical spine. No fracture or other acute abnormality is noted. Electronically Signed   By: Marijo Conception, M.D.   On: 02/01/2019 10:04   Ct Hip Left Wo Contrast  Result Date: 02/01/2019 CLINICAL DATA:  Left hip pain after fall EXAM: CT OF THE LEFT HIP WITHOUT CONTRAST TECHNIQUE: Multidetector CT imaging of the left hip was performed according to the standard protocol. Multiplanar CT image reconstructions were also generated. COMPARISON:  02/01/2019 radiographs FINDINGS: Bones/Joint/Cartilage Acute vertical  fracture of the left sacral ala. Acute fracture of the anterior wall left acetabulum involving the lateral attachment of the superior pubic ramus. Vertically  oriented fracture of the left pubic body extending through the superior and inferior pubic ramus margins. I would do not see a discrete fracture the left proximal femur. The appearance suggesting intratrochanteric fracture seems to most likely be due to spurring. Ligaments Suboptimally assessed by CT. Muscles and Tendons There is likely some mild expansion of the left piriformis muscle due to mild hematoma. Soft tissues Abdominal aortic atherosclerotic calcification. Sigmoid colon diverticulosis. Small direct left inguinal hernia contains adipose tissue. IMPRESSION: 1. Left pelvic fractures include a vertical fracture of the left sacral ala; acute fracture of the anterior wall of the left acetabulum extending into the lateral attachment of the superior pubic ramus; and a vertically oriented fracture of the left pubic body. 2. I do not see a definite proximal femoral fracture on the left. The appearance of possible intertrochanteric fracture on radiography was probably caused by spurring rather than a femoral fracture. 3. Mild hematoma within or along the left piriformis muscle, not causing significant impingement at the sciatic notch. 4.  Aortic Atherosclerosis (ICD10-I70.0). 5. Sigmoid colon diverticulosis. 6. Small left groin hernia containing adipose tissue. Electronically Signed   By: Van Clines M.D.   On: 02/01/2019 11:05   Dg Shoulder Left  Result Date: 02/01/2019 CLINICAL DATA:  Fall, pain EXAM: LEFT SHOULDER - 2+ VIEW COMPARISON:  None. FINDINGS: No fracture or dislocation is seen. The joint spaces are preserved. Visualized soft tissues are within normal limits. Visualized left lung is clear. IMPRESSION: Negative. Electronically Signed   By: Julian Hy M.D.   On: 02/01/2019 09:45   Dg Knee Complete 4 Views Right  Result Date:  02/01/2019 CLINICAL DATA:  Fall, pain EXAM: RIGHT KNEE - COMPLETE 4+ VIEW COMPARISON:  None. FINDINGS: No fracture or dislocation is seen. The joint spaces are preserved. The visualized soft tissues are unremarkable. No suprapatellar knee joint effusion. IMPRESSION: Negative. Electronically Signed   By: Julian Hy M.D.   On: 02/01/2019 09:42   Dg Hip Unilat W Or Wo Pelvis 2-3 Views Left  Result Date: 02/01/2019 CLINICAL DATA:  Fall, left posterior hip pain EXAM: DG HIP (WITH OR WITHOUT PELVIS) 2-3V LEFT COMPARISON:  None. FINDINGS: Nondisplaced fracture at least involving the greater trochanter of the left hip. However, subtle lucency through the femoral neck favors a nondisplaced intertrochanteric left hip fracture. Bilateral hip joint spaces are preserved. Visualized bony pelvis appears intact. Degenerative changes of the lower lumbar spine. IMPRESSION: Suspected nondisplaced intertrochanteric left hip fracture, as less likely nondisplaced fracture of the left greater trochanter. Electronically Signed   By: Julian Hy M.D.   On: 02/01/2019 09:44    Procedures Procedures (including critical care time)  Medications Ordered in ED Medications  fentaNYL (SUBLIMAZE) injection 50 mcg (50 mcg Intravenous Given 02/01/19 0749)  ondansetron (ZOFRAN) injection 4 mg (4 mg Intravenous Given 02/01/19 0811)  metoCLOPramide (REGLAN) injection 10 mg (10 mg Intravenous Given 02/01/19 0824)  LORazepam (ATIVAN) injection 0.5 mg (0.5 mg Intravenous Given 02/01/19 0901)     Initial Impression / Assessment and Plan / ED Course  I have reviewed the triage vital signs and the nursing notes.  Pertinent labs & imaging results that were available during my care of the patient were reviewed by me and considered in my medical decision making (see chart for details).        Patient presenting with left hip pain after fall yesterday evening.  She also has some more mild pain to her left upper extremity and  right knee.  X-ray of the left hip shows suspected nondisplaced intertrochanteric left hip fracture.  CT head and C-spine are negative for acute findings, but does show moderate to severe DDD and cervical spine.  Patient is neurovascularly intact.  X-rays of the left shoulder, elbow negative.  Right knee x-ray is also negative.  I discussed patient case with orthopedic surgeon, Dr. Doreatha Martin, who will evaluate the patient in anticipation of surgery today.  Patient's last PO intake was last evening.  Dr. Doreatha Martin requests admission by medicine service.  I discussed patient case with Dr. Tyrell Antonio with [facility] who accepts patient for admission.  I appreciate the above consultants for their assistance with the patient.   CT hip ordered by Dr. Doreatha Martin returned with findings of left pelvic fractures, but no femoral fractures. These injuries are not surgical. Dr. Doreatha Martin still recommends admission to medicine for PT with [facility], as the patient cannot bear weight on the L leg and can barely move in bed without screaming in pain.  I have called patient's daughter, [name], to update her with plan.  Questions answered.  Final Clinical Impressions(s) / ED Diagnoses   Final diagnoses:  Closed fracture of other parts of pelvis, initial encounter [facility])    ED Discharge Orders    None           Frederica Kuster, PA-C 02/02/19 1155    Blanchie Dessert, MD 02/04/19 [phone]

## 2019-02-01 NOTE — ED Notes (Signed)
[phone], Daughter

## 2019-02-01 NOTE — H&P (Signed)
History and Physical  Amy Kaiser RDE:[id number] DOB: [birth year] DOA: 02/01/2019  PCP: Harlan Stains, MD Patient coming from: Home   I have personally briefly reviewed patient's old medical records in [place]   Chief Complaint: hip pain, unable to ambulate.   HPI: Amy Kaiser is a 85 y.o. female with past medical history significant for COPD, Diastolic Heart failure, aortic valve stenosis status post TAVR 2018, who presents complaining of hip pain and inability to ambulate since the day prior to admission after patient suffered a mechanical fall.  Patient was doing so on gardening at home when she tripped over something and landed on her left hip.  She hit her head also.  She wake up today with severe pain on her left hip.  She has not been able to ambulate.  She denies chest pain, shortness of breath, fever.  Evaluation in the ED; Sodium 137, potassium 3.7, BUN 11, creatinine 0.8, calcium 8.7, hemoglobin 10, white blood cell 11.9 platelets 136. Chest x-ray:Possible trace left pleural effusion. Chronic left basilar opacity/scarring. Elbow x-ray: Negative, knee x-ray: Negative.  Left shoulder x-ray; negative. Hip x-ray; Suspected nondisplaced intertrochanteric left hip fracture, as less likely nondisplaced fracture of the left greater trochanter. CT left hip: Left pelvic fractures include a vertical fracture of the left sacral ala; acute fracture of the anterior wall of the left acetabulum extending into the lateral attachment of the superior pubic ramus; and a vertically oriented fracture of the left pubic body. I do not see a definite proximal femoral fracture on the left. The appearance of possible intertrochanteric fracture on radiography was probably caused by spurring rather than a femoral fracture. Mild hematoma within or along the left piriformis muscle, not causing significant impingement at the sciatic notch. CT head; no acute intracranial abnormality. CT cervical;  Moderate to severe multilevel degenerative disc disease is noted in the cervical spine. No fracture or other acute abnormality is noted.   Review of Systems: All systems reviewed and apart from history of presenting illness, are negative.  Past Medical History:  Diagnosis Date   Anemia    Anxiety    Arthritis    "just about all over" (03/11/2017)   Asthma    Cervical cancer ([place])    COPD (chronic obstructive pulmonary disease) (HCC)    Coronary artery disease    Depression    Heart murmur    Hyperlipidemia    Low blood sugar    Migraine    "work related; quit when I quit work in ~ 1997" (03/11/2017)   Skin cancer    back   Past Surgical History:  Procedure Laterality Date   APPENDECTOMY     CARDIAC CATHETERIZATION  02/2017   CATARACT EXTRACTION W/ INTRAOCULAR LENS  IMPLANT, BILATERAL Bilateral    CORONARY ANGIOPLASTY WITH STENT PLACEMENT  03/11/2017   CORONARY STENT INTERVENTION N/A 03/11/2017   Procedure: Coronary Stent Intervention;  Surgeon: Sherren Mocha, MD;  Location: [facility];  Service: Cardiovascular;  Laterality: N/A;   IR RADIOLOGY PERIPHERAL GUIDED IV START  02/26/2017   IR US GUIDE VASC ACCESS RIGHT  02/26/2017   RIGHT/LEFT HEART CATH AND CORONARY ANGIOGRAPHY N/A 02/20/2017   Procedure: Right/Left Heart Cath and Coronary Angiography;  Surgeon: Sherren Mocha, MD;  Location: [facility];  Service: Cardiovascular;  Laterality: N/A;   SKIN CANCER EXCISION     "back"   TEE WITHOUT CARDIOVERSION N/A 04/09/2017   Procedure: TRANSESOPHAGEAL ECHOCARDIOGRAM (TEE);  Surgeon:  Sherren Mocha, MD;  Location: [facility];  Service: Open Heart Surgery;  Laterality: N/A;   TRANSCATHETER AORTIC VALVE REPLACEMENT, TRANSFEMORAL N/A 04/09/2017   Procedure: TRANSCATHETER AORTIC VALVE REPLACEMENT, TRANSFEMORAL;  Surgeon: Sherren Mocha, MD;  Location: [facility];  Service: Open Heart Surgery;  Laterality: N/A;   VAGINAL HYSTERECTOMY     Social History:   reports that she has never smoked. She has never used smokeless tobacco. She reports that she does not drink alcohol or use drugs.   Allergies  Allergen Reactions   Latex Hives, Itching and Other (See Comments)    Caused blisters in her mouth   Lisinopril Other (See Comments)    Weakness    Penicillins Other (See Comments)    Intolerance to ALL "CILLINS" > UNSPECIFIED REACTIONS  Has patient had a PCN reaction causing immediate rash, facial/tongue/throat swelling, SOB or lightheadedness with hypotension: UNSPECIFIED REACTION  Has patient had a PCN reaction causing severe rash involving mucus membranes or skin necrosis: UNSPECIFIED REACTION  Has patient had a PCN reaction that required hospitalization UNSPECIFIED REACTION  Has patient had a PCN reaction occurring within the last 10 years: UNSPECIFIED REACTION    Pravachol [Pravastatin Sodium] Other (See Comments)    MYALGIAs LEG PAIN    Alendronate Sodium Other (See Comments)    UNSPECIFIED REACTION  [Patient denies this allergy]    Codeine Nausea And Vomiting   Morphine And Related Nausea And Vomiting   Prozac [Fluoxetine Hcl] Anxiety    "FEELS SHAKY"    Symbicort [Budesonide-Formoterol Fumarate] Anxiety    "SHAKY"    Wellbutrin [Bupropion] Anxiety    "JITTERY"    Family History; father died of an MI, brother had heart disease.   Prior to Admission medications   Medication Sig Start Date End Date Taking? Authorizing Provider  aspirin EC 81 MG tablet Take 81 mg by mouth daily.    [provider]  calcium-vitamin D (OSCAL WITH D) 500-200 MG-UNIT tablet Take 1 tablet by mouth 2 (two) times daily.    [provider]  Cholecalciferol (VITAMIN D3) 1000 units CAPS Take 1,000 Units by mouth daily with lunch.     [provider]  estradiol (ESTRACE VAGINAL) 0.1 MG/GM vaginal cream Apply 0.5mg  (pea-sized amount)  just inside the vaginal introitus with a finger-tip on Monday, Wednesday and Friday  nights. Patient not taking: Reported on 12/25/2018 09/23/18   Zara Council A, PA-C  FLOVENT HFA 220 MCG/ACT inhaler Take 1 puff by mouth daily as needed (wheezing).  04/07/15   [provider]  loperamide (IMODIUM A-D) 2 MG tablet Take 2 mg by mouth as needed for diarrhea or loose stools.    [provider]  metoprolol tartrate (LOPRESSOR) 25 MG tablet TAKE 1/2 (ONE-HALF) TABLET BY MOUTH TWICE DAILY 10/21/18   Sherren Mocha, MD  Multiple Vitamins-Minerals (ICAPS AREDS 2 PO) Take 1 capsule by mouth 2 (two) times daily.    [provider]  nitroGLYCERIN (NITROSTAT) 0.4 MG SL tablet Place 1 tablet (0.4 mg total) under the tongue every 5 (five) minutes x 3 doses as needed for chest pain. 04/30/18   Sherren Mocha, MD  rosuvastatin (CRESTOR) 10 MG tablet Take 10 mg by mouth daily with supper.     [provider]  VENTOLIN HFA 108 (90 BASE) MCG/ACT inhaler Inhale 2 puffs into the lungs 2 (two) times daily as needed for wheezing or shortness of breath.  03/14/15   [provider]   Physical Exam: Vitals:   02/01/19  0715 02/01/19 0745 02/01/19 1000 02/01/19 1030  BP: (!) 156/70 (!) 151/72 (!) 133/54 (!) 136/56  Pulse: 93 89 85 89  Resp: 19 (!) 22 18 17   Temp:      TempSrc:      SpO2: 95% 96% 93% 99%  Weight:      Height:         General exam: Moderately built and nourished patient, lying comfortably supine on the gurney in no obvious distress.  Head, eyes and ENT: Nontraumatic and normocephalic. Pupils equally reacting to light and accommodation. Oral mucosa moist.  Neck: Supple. No JVD, carotid bruit or thyromegaly.  Lymphatics: No lymphadenopathy.  Respiratory system: Clear to auscultation. No increased work of breathing.  Cardiovascular system: S1 and S2 heard, RRR. No JVD, , gallops, clicks or pedal edema.  Gastrointestinal system: Abdomen is nondistended, soft and nontender. Normal bowel sounds heard. No organomegaly or masses  appreciated.  Central nervous system: Alert and oriented.   Extremities: Trace edema, no significant deformity.  Peripheral pulses symmetrically felt.   Skin: No rashes or acute findings.  Musculoskeletal system: Negative exam.  Psychiatry: Pleasant and cooperative.   Labs on Admission:  Basic Metabolic Panel: Recent Labs  Lab 02/01/19 0741  NA 137  K 3.7  CL 102  CO2 27  GLUCOSE 127*  BUN 11  CREATININE 0.81  CALCIUM 8.7*   Liver Function Tests: No results for input(s): AST, ALT, ALKPHOS, BILITOT, PROT, ALBUMIN in the last 168 hours. No results for input(s): LIPASE, AMYLASE in the last 168 hours. No results for input(s): AMMONIA in the last 168 hours. CBC: Recent Labs  Lab 02/01/19 0741  WBC 11.9*  NEUTROABS 9.3*  HGB 10.7*  HCT 33.0*  MCV 97.1  PLT 136*   Cardiac Enzymes: No results for input(s): CKTOTAL, CKMB, CKMBINDEX, TROPONINI in the last 168 hours.  BNP (last 3 results) No results for input(s): PROBNP in the last 8760 hours. CBG: Recent Labs  Lab 02/01/19 0852  GLUCAP 144*    Radiological Exams on Admission: Dg Chest 1 View  Result Date: 02/01/2019 CLINICAL DATA:  Fall, left posterior hip pain EXAM: CHEST  1 VIEW COMPARISON:  04/10/2017 FINDINGS: Left basilar opacity, chronic, likely reflecting scarring. Possible trace left pleural effusion. Right lung is clear. No pneumothorax. The heart is normal in size.  Prosthetic aortic valve. IMPRESSION: Possible trace left pleural effusion. Chronic left basilar opacity/scarring. Electronically Signed   By: Julian Hy M.D.   On: 02/01/2019 09:46   Dg Elbow Complete Left  Result Date: 02/01/2019 CLINICAL DATA:  Fall, pain EXAM: LEFT ELBOW - COMPLETE 3+ VIEW COMPARISON:  None. FINDINGS: No fracture or dislocation is seen. The joint spaces are preserved. Visualized soft tissues are within normal limits. No displaced elbow joint fat pads to suggest an elbow joint effusion. IMPRESSION: Negative.  Electronically Signed   By: Julian Hy M.D.   On: 02/01/2019 09:45   Ct Head Wo Contrast  Result Date: 02/01/2019 CLINICAL DATA:  Head injury after fall.  No loss of consciousness. EXAM: CT HEAD WITHOUT CONTRAST CT CERVICAL SPINE WITHOUT CONTRAST TECHNIQUE: Multidetector CT imaging of the head and cervical spine was performed following the standard protocol without intravenous contrast. Multiplanar CT image reconstructions of the cervical spine were also generated. COMPARISON:  CT scan of May 18, 2015. FINDINGS: CT HEAD FINDINGS Brain: Mild diffuse cortical atrophy is noted. No mass effect or midline shift is noted. Ventricular size is within normal limits. There is no evidence of  mass lesion, hemorrhage or acute infarction. Vascular: No hyperdense vessel or unexpected calcification. Skull: Normal. Negative for fracture or focal lesion. Sinuses/Orbits: No acute finding. Other: None. CT CERVICAL SPINE FINDINGS Alignment: Minimal grade 1 anterolisthesis of C3-4 is noted secondary to posterior facet joint hypertrophy. Skull base and vertebrae: No acute fracture. No primary bone lesion or focal pathologic process. Soft tissues and spinal canal: No prevertebral fluid or swelling. No visible canal hematoma. Disc levels: Severe degenerative disc disease is noted at C4-5 with anterior posterior osteophyte formation. Moderate degenerative disc disease is noted at C5-6 and C6-7. Upper chest: Negative. Other: Degenerative changes are seen involving posterior facet joints bilaterally. IMPRESSION: Mild diffuse cortical atrophy. No acute intracranial abnormality seen. Moderate to severe multilevel degenerative disc disease is noted in the cervical spine. No fracture or other acute abnormality is noted. Electronically Signed   By: Marijo Conception, M.D.   On: 02/01/2019 10:04   Ct Cervical Spine Wo Contrast  Result Date: 02/01/2019 CLINICAL DATA:  Head injury after fall.  No loss of consciousness. EXAM: CT HEAD  WITHOUT CONTRAST CT CERVICAL SPINE WITHOUT CONTRAST TECHNIQUE: Multidetector CT imaging of the head and cervical spine was performed following the standard protocol without intravenous contrast. Multiplanar CT image reconstructions of the cervical spine were also generated. COMPARISON:  CT scan of May 18, 2015. FINDINGS: CT HEAD FINDINGS Brain: Mild diffuse cortical atrophy is noted. No mass effect or midline shift is noted. Ventricular size is within normal limits. There is no evidence of mass lesion, hemorrhage or acute infarction. Vascular: No hyperdense vessel or unexpected calcification. Skull: Normal. Negative for fracture or focal lesion. Sinuses/Orbits: No acute finding. Other: None. CT CERVICAL SPINE FINDINGS Alignment: Minimal grade 1 anterolisthesis of C3-4 is noted secondary to posterior facet joint hypertrophy. Skull base and vertebrae: No acute fracture. No primary bone lesion or focal pathologic process. Soft tissues and spinal canal: No prevertebral fluid or swelling. No visible canal hematoma. Disc levels: Severe degenerative disc disease is noted at C4-5 with anterior posterior osteophyte formation. Moderate degenerative disc disease is noted at C5-6 and C6-7. Upper chest: Negative. Other: Degenerative changes are seen involving posterior facet joints bilaterally. IMPRESSION: Mild diffuse cortical atrophy. No acute intracranial abnormality seen. Moderate to severe multilevel degenerative disc disease is noted in the cervical spine. No fracture or other acute abnormality is noted. Electronically Signed   By: Marijo Conception, M.D.   On: 02/01/2019 10:04   Ct Hip Left Wo Contrast  Result Date: 02/01/2019 CLINICAL DATA:  Left hip pain after fall EXAM: CT OF THE LEFT HIP WITHOUT CONTRAST TECHNIQUE: Multidetector CT imaging of the left hip was performed according to the standard protocol. Multiplanar CT image reconstructions were also generated. COMPARISON:  02/01/2019 radiographs FINDINGS:  Bones/Joint/Cartilage Acute vertical fracture of the left sacral ala. Acute fracture of the anterior wall left acetabulum involving the lateral attachment of the superior pubic ramus. Vertically oriented fracture of the left pubic body extending through the superior and inferior pubic ramus margins. I would do not see a discrete fracture the left proximal femur. The appearance suggesting intratrochanteric fracture seems to most likely be due to spurring. Ligaments Suboptimally assessed by CT. Muscles and Tendons There is likely some mild expansion of the left piriformis muscle due to mild hematoma. Soft tissues Abdominal aortic atherosclerotic calcification. Sigmoid colon diverticulosis. Small direct left inguinal hernia contains adipose tissue. IMPRESSION: 1. Left pelvic fractures include a vertical fracture of the left sacral ala; acute fracture of  the anterior wall of the left acetabulum extending into the lateral attachment of the superior pubic ramus; and a vertically oriented fracture of the left pubic body. 2. I do not see a definite proximal femoral fracture on the left. The appearance of possible intertrochanteric fracture on radiography was probably caused by spurring rather than a femoral fracture. 3. Mild hematoma within or along the left piriformis muscle, not causing significant impingement at the sciatic notch. 4.  Aortic Atherosclerosis (ICD10-I70.0). 5. Sigmoid colon diverticulosis. 6. Small left groin hernia containing adipose tissue. Electronically Signed   By: Van Clines M.D.   On: 02/01/2019 11:05   Dg Shoulder Left  Result Date: 02/01/2019 CLINICAL DATA:  Fall, pain EXAM: LEFT SHOULDER - 2+ VIEW COMPARISON:  None. FINDINGS: No fracture or dislocation is seen. The joint spaces are preserved. Visualized soft tissues are within normal limits. Visualized left lung is clear. IMPRESSION: Negative. Electronically Signed   By: Julian Hy M.D.   On: 02/01/2019 09:45   Dg Knee  Complete 4 Views Right  Result Date: 02/01/2019 CLINICAL DATA:  Fall, pain EXAM: RIGHT KNEE - COMPLETE 4+ VIEW COMPARISON:  None. FINDINGS: No fracture or dislocation is seen. The joint spaces are preserved. The visualized soft tissues are unremarkable. No suprapatellar knee joint effusion. IMPRESSION: Negative. Electronically Signed   By: Julian Hy M.D.   On: 02/01/2019 09:42   Dg Hip Unilat W Or Wo Pelvis 2-3 Views Left  Result Date: 02/01/2019 CLINICAL DATA:  Fall, left posterior hip pain EXAM: DG HIP (WITH OR WITHOUT PELVIS) 2-3V LEFT COMPARISON:  None. FINDINGS: Nondisplaced fracture at least involving the greater trochanter of the left hip. However, subtle lucency through the femoral neck favors a nondisplaced intertrochanteric left hip fracture. Bilateral hip joint spaces are preserved. Visualized bony pelvis appears intact. Degenerative changes of the lower lumbar spine. IMPRESSION: Suspected nondisplaced intertrochanteric left hip fracture, as less likely nondisplaced fracture of the left greater trochanter. Electronically Signed   By: Julian Hy M.D.   On: 02/01/2019 09:44    EKG: none   Assessment/Plan Active Problems:   S/P TAVR (transcatheter aortic valve replacement)   Essential hypertension   Chronic diastolic CHF (congestive heart failure) (HCC)   Pelvis fracture (HCC)   Pelvic fracture (HCC)   1-pelvic fracture: Sacrum, pubic ramus fracture. Patient will be admitted under observation, for pain management and physical therapist evaluation. She gets very nauseous with morphine and fentanyl. I will start tramadol as needed. She will receive a scheduled Tylenol. Appreciate orthopedic evaluation. Per orthopedic weightbearing as tolerated. Follow patient can be discharged tomorrow.  2-status post TAVR; Continue with aspirin, metoprolol.  3-chronic diastolic heart failure; Continue with metoprolol, aspirin.  Not on diuretic.  4-leukocytosis: Mild. UA  pending. Repeat BC in a.m.  5-anemia; We will check anemia panel. Hemoglobin 2 years ago at 9.4.     DVT Prophylaxis: Lovenox while in the hospital Code Status: Full code per daughter Family Communication: Care discussed with daughter over the phone. Disposition Plan: Admit under observation for 24 hours for pain management and physical therapy evaluation.   Time spent: 75 minutes.   Elmarie Shiley MD [facility]   02/01/2019, 12:48 PM

## 2019-02-01 NOTE — Plan of Care (Signed)

## 2019-02-01 NOTE — ED Notes (Signed)
EDP at bedside  

## 2019-02-01 NOTE — Consult Note (Addendum)
Orthopaedic Trauma Service (OTS) Consult   Patient ID: Amy Kaiser MRN: [id number] DOB/AGE: 1929-04-16 85 y.o.   Reason for Consult: Left hip pain due to ground-level fall Referring Physician: Eliezer Mccoy, PA-C  PCP: Harlan Stains, MD [facility] family medicine)   HPI: Amy Kaiser is an 85 y.o. White female with a complex medical history who sustained a ground-level fall yesterday.  Patient states that she was picking flowers in her garden when she tripped over something landing on her left hip.  Pain is been increasing since that time and she has had difficulty bearing weight on her left leg.  Patient states that she does use a cane on her right side when walking significant distances and "has to keep up with people" otherwise she does not use any assistive devices.  She does live in her own home but her daughter and granddaughters live across the street.  States that she has 2 stairs to get into her house.  Patient states that her pain is primarily located in her lower back not really having any significant groin pain.  Denies any numbness or tingling in her lower extremities.  She is about a year and a half out from a aortic valve replacement which did have a significant positive impact on her overall wellbeing and her ability to participate in activities.  Patient states that her pain is worse with activity is relieved with rest and medication.   She states that she has had a bone density scan but thinks it is been at least 5 years.  I do not see any record on epic of this.  States that she is not on any medication for osteoporosis.  She is on low dose vitamin D and calcium  Patient also notes that she has had a history of low back injection suspect that these were epidural steroid injections.  Does not recall when the last one was.   Patient no longer drives.  She gave this up when she was having her cardiac issues.  Past Medical History:  Diagnosis Date   Anemia      Anxiety    Arthritis    "just about all over" (03/11/2017)   Asthma    Cervical cancer ([place])    COPD (chronic obstructive pulmonary disease) (HCC)    Coronary artery disease    Depression    Heart murmur    Hyperlipidemia    Low blood sugar    Migraine    "work related; quit when I quit work in ~ 1997" (03/11/2017)   Skin cancer    back    Past Surgical History:  Procedure Laterality Date   APPENDECTOMY     CARDIAC CATHETERIZATION  02/2017   CATARACT EXTRACTION W/ INTRAOCULAR LENS  IMPLANT, BILATERAL Bilateral    CORONARY ANGIOPLASTY WITH STENT PLACEMENT  03/11/2017   CORONARY STENT INTERVENTION N/A 03/11/2017   Procedure: Coronary Stent Intervention;  Surgeon: Sherren Mocha, MD;  Location: [facility];  Service: Cardiovascular;  Laterality: N/A;   IR RADIOLOGY PERIPHERAL GUIDED IV START  02/26/2017   IR US GUIDE VASC ACCESS RIGHT  02/26/2017   RIGHT/LEFT HEART CATH AND CORONARY ANGIOGRAPHY N/A 02/20/2017   Procedure: Right/Left Heart Cath and Coronary Angiography;  Surgeon: Sherren Mocha, MD;  Location: [facility];  Service: Cardiovascular;  Laterality: N/A;   SKIN CANCER EXCISION     "back"   TEE WITHOUT CARDIOVERSION N/A 04/09/2017   Procedure: TRANSESOPHAGEAL ECHOCARDIOGRAM (  TEE);  Surgeon: Sherren Mocha, MD;  Location: [place];  Service: Open Heart Surgery;  Laterality: N/A;   TRANSCATHETER AORTIC VALVE REPLACEMENT, TRANSFEMORAL N/A 04/09/2017   Procedure: TRANSCATHETER AORTIC VALVE REPLACEMENT, TRANSFEMORAL;  Surgeon: Sherren Mocha, MD;  Location: [place];  Service: Open Heart Surgery;  Laterality: N/A;   VAGINAL HYSTERECTOMY      History reviewed. No pertinent family history.  Social History:  reports that she has never smoked. She has never used smokeless tobacco. She reports that she does not drink alcohol or use drugs.  Allergies:  Allergies  Allergen Reactions   Latex Hives, Itching and Other (See Comments)    Caused blisters  in her mouth   Lisinopril Other (See Comments)    Weakness    Penicillins Other (See Comments)    Intolerance to ALL "CILLINS" > UNSPECIFIED REACTIONS  Has patient had a PCN reaction causing immediate rash, facial/tongue/throat swelling, SOB or lightheadedness with hypotension: UNSPECIFIED REACTION  Has patient had a PCN reaction causing severe rash involving mucus membranes or skin necrosis: UNSPECIFIED REACTION  Has patient had a PCN reaction that required hospitalization UNSPECIFIED REACTION  Has patient had a PCN reaction occurring within the last 10 years: UNSPECIFIED REACTION    Pravachol [Pravastatin Sodium] Other (See Comments)    MYALGIAs LEG PAIN    Alendronate Sodium Other (See Comments)    UNSPECIFIED REACTION  [Patient denies this allergy]    Codeine Nausea And Vomiting   Morphine And Related Nausea And Vomiting   Prozac [Fluoxetine Hcl] Anxiety    "FEELS SHAKY"    Symbicort [Budesonide-Formoterol Fumarate] Anxiety    "SHAKY"    Wellbutrin [Bupropion] Anxiety    "JITTERY"     Medications: I have reviewed the patient's current medications.  No current facility-administered medications on file prior to encounter.    Current Outpatient Medications on File Prior to Encounter  Medication Sig Dispense Refill   aspirin EC 81 MG tablet Take 81 mg by mouth daily.     calcium-vitamin D (OSCAL WITH D) 500-200 MG-UNIT tablet Take 1 tablet by mouth 2 (two) times daily.     Cholecalciferol (VITAMIN D3) 1000 units CAPS Take 1,000 Units by mouth daily with lunch.      estradiol (ESTRACE VAGINAL) 0.1 MG/GM vaginal cream Apply 0.5mg  (pea-sized amount)  just inside the vaginal introitus with a finger-tip on Monday, Wednesday and Friday nights. (Patient not taking: Reported on 12/25/2018) 30 g 12   FLOVENT HFA 220 MCG/ACT inhaler Take 1 puff by mouth daily as needed (wheezing).   12   loperamide (IMODIUM A-D) 2 MG tablet Take 2 mg by mouth as needed for diarrhea or  loose stools.     metoprolol tartrate (LOPRESSOR) 25 MG tablet TAKE 1/2 (ONE-HALF) TABLET BY MOUTH TWICE DAILY 90 tablet 2   Multiple Vitamins-Minerals (ICAPS AREDS 2 PO) Take 1 capsule by mouth 2 (two) times daily.     nitroGLYCERIN (NITROSTAT) 0.4 MG SL tablet Place 1 tablet (0.4 mg total) under the tongue every 5 (five) minutes x 3 doses as needed for chest pain. 25 tablet 1   rosuvastatin (CRESTOR) 10 MG tablet Take 10 mg by mouth daily with supper.      VENTOLIN HFA 108 (90 BASE) MCG/ACT inhaler Inhale 2 puffs into the lungs 2 (two) times daily as needed for wheezing or shortness of breath.   2     Results for orders placed or performed during the hospital encounter of 02/01/19 (from the past 48  hour(s))  Basic metabolic panel     Status: Abnormal   Collection Time: 02/01/19  7:41 AM  Result Value Ref Range   Sodium 137 135 - 145 mmol/L   Potassium 3.7 3.5 - 5.1 mmol/L   Chloride 102 98 - 111 mmol/L   CO2 27 22 - 32 mmol/L   Glucose, Bld 127 (H) 70 - 99 mg/dL   BUN 11 8 - 23 mg/dL   Creatinine, Ser 0.81 0.44 - 1.00 mg/dL   Calcium 8.7 (L) 8.9 - 10.3 mg/dL   GFR calc non Af Amer >60 >60 mL/min   GFR calc Af Amer >60 >60 mL/min   Anion gap 8 5 - 15    Comment: Performed at [facility], [place] [address] [place], [place] [address]  CBC with Differential     Status: Abnormal   Collection Time: 02/01/19  7:41 AM  Result Value Ref Range   WBC 11.9 (H) 4.0 - 10.5 K/uL   RBC 3.40 (L) 3.87 - 5.11 MIL/uL   Hemoglobin 10.7 (L) 12.0 - 15.0 g/dL   HCT 33.0 (L) 36.0 - 46.0 %   MCV 97.1 80.0 - 100.0 fL   MCH 31.5 26.0 - 34.0 pg   MCHC 32.4 30.0 - 36.0 g/dL   RDW 13.3 11.5 - 15.5 %   Platelets 136 (L) 150 - 400 K/uL    Comment: PLATELET COUNT CONFIRMED BY SMEAR SPECIMEN CHECKED FOR CLOTS    nRBC 0.0 0.0 - 0.2 %   Neutrophils Relative % 77 %   Neutro Abs 9.3 (H) 1.7 - 7.7 K/uL   Lymphocytes Relative 12 %   Lymphs Abs 1.5 0.7 - 4.0 K/uL   Monocytes Relative 7 %   Monocytes  Absolute 0.8 0.1 - 1.0 K/uL   Eosinophils Relative 2 %   Eosinophils Absolute 0.2 0.0 - 0.5 K/uL   Basophils Relative 1 %   Basophils Absolute 0.1 0.0 - 0.1 K/uL   Immature Granulocytes 1 %   Abs Immature Granulocytes 0.06 0.00 - 0.07 K/uL    Comment: Performed at [facility], [place] [address], [place],  [address]  CBG monitoring, ED     Status: Abnormal   Collection Time: 02/01/19  8:52 AM  Result Value Ref Range   Glucose-Capillary 144 (H) 70 - 99 mg/dL    Dg Chest 1 View  Result Date: 02/01/2019 CLINICAL DATA:  Fall, left posterior hip pain EXAM: CHEST  1 VIEW COMPARISON:  04/10/2017 FINDINGS: Left basilar opacity, chronic, likely reflecting scarring. Possible trace left pleural effusion. Right lung is clear. No pneumothorax. The heart is normal in size.  Prosthetic aortic valve. IMPRESSION: Possible trace left pleural effusion. Chronic left basilar opacity/scarring. Electronically Signed   By: Julian Hy M.D.   On: 02/01/2019 09:46   Dg Elbow Complete Left  Result Date: 02/01/2019 CLINICAL DATA:  Fall, pain EXAM: LEFT ELBOW - COMPLETE 3+ VIEW COMPARISON:  None. FINDINGS: No fracture or dislocation is seen. The joint spaces are preserved. Visualized soft tissues are within normal limits. No displaced elbow joint fat pads to suggest an elbow joint effusion. IMPRESSION: Negative. Electronically Signed   By: Julian Hy M.D.   On: 02/01/2019 09:45   Ct Head Wo Contrast  Result Date: 02/01/2019 CLINICAL DATA:  Head injury after fall.  No loss of consciousness. EXAM: CT HEAD WITHOUT CONTRAST CT CERVICAL SPINE WITHOUT CONTRAST TECHNIQUE: Multidetector CT imaging of the head and cervical spine was performed following the standard  protocol without intravenous contrast. Multiplanar CT image reconstructions of the cervical spine were also generated. COMPARISON:  CT scan of May 18, 2015. FINDINGS: CT HEAD FINDINGS Brain: Mild diffuse cortical atrophy is noted. No mass effect  or midline shift is noted. Ventricular size is within normal limits. There is no evidence of mass lesion, hemorrhage or acute infarction. Vascular: No hyperdense vessel or unexpected calcification. Skull: Normal. Negative for fracture or focal lesion. Sinuses/Orbits: No acute finding. Other: None. CT CERVICAL SPINE FINDINGS Alignment: Minimal grade 1 anterolisthesis of C3-4 is noted secondary to posterior facet joint hypertrophy. Skull base and vertebrae: No acute fracture. No primary bone lesion or focal pathologic process. Soft tissues and spinal canal: No prevertebral fluid or swelling. No visible canal hematoma. Disc levels: Severe degenerative disc disease is noted at C4-5 with anterior posterior osteophyte formation. Moderate degenerative disc disease is noted at C5-6 and C6-7. Upper chest: Negative. Other: Degenerative changes are seen involving posterior facet joints bilaterally. IMPRESSION: Mild diffuse cortical atrophy. No acute intracranial abnormality seen. Moderate to severe multilevel degenerative disc disease is noted in the cervical spine. No fracture or other acute abnormality is noted. Electronically Signed   By: Marijo Conception, M.D.   On: 02/01/2019 10:04   Ct Cervical Spine Wo Contrast  Result Date: 02/01/2019 CLINICAL DATA:  Head injury after fall.  No loss of consciousness. EXAM: CT HEAD WITHOUT CONTRAST CT CERVICAL SPINE WITHOUT CONTRAST TECHNIQUE: Multidetector CT imaging of the head and cervical spine was performed following the standard protocol without intravenous contrast. Multiplanar CT image reconstructions of the cervical spine were also generated. COMPARISON:  CT scan of May 18, 2015. FINDINGS: CT HEAD FINDINGS Brain: Mild diffuse cortical atrophy is noted. No mass effect or midline shift is noted. Ventricular size is within normal limits. There is no evidence of mass lesion, hemorrhage or acute infarction. Vascular: No hyperdense vessel or unexpected calcification. Skull:  Normal. Negative for fracture or focal lesion. Sinuses/Orbits: No acute finding. Other: None. CT CERVICAL SPINE FINDINGS Alignment: Minimal grade 1 anterolisthesis of C3-4 is noted secondary to posterior facet joint hypertrophy. Skull base and vertebrae: No acute fracture. No primary bone lesion or focal pathologic process. Soft tissues and spinal canal: No prevertebral fluid or swelling. No visible canal hematoma. Disc levels: Severe degenerative disc disease is noted at C4-5 with anterior posterior osteophyte formation. Moderate degenerative disc disease is noted at C5-6 and C6-7. Upper chest: Negative. Other: Degenerative changes are seen involving posterior facet joints bilaterally. IMPRESSION: Mild diffuse cortical atrophy. No acute intracranial abnormality seen. Moderate to severe multilevel degenerative disc disease is noted in the cervical spine. No fracture or other acute abnormality is noted. Electronically Signed   By: Marijo Conception, M.D.   On: 02/01/2019 10:04   Ct Hip Left Wo Contrast  Result Date: 02/01/2019 CLINICAL DATA:  Left hip pain after fall EXAM: CT OF THE LEFT HIP WITHOUT CONTRAST TECHNIQUE: Multidetector CT imaging of the left hip was performed according to the standard protocol. Multiplanar CT image reconstructions were also generated. COMPARISON:  02/01/2019 radiographs FINDINGS: Bones/Joint/Cartilage Acute vertical fracture of the left sacral ala. Acute fracture of the anterior wall left acetabulum involving the lateral attachment of the superior pubic ramus. Vertically oriented fracture of the left pubic body extending through the superior and inferior pubic ramus margins. I would do not see a discrete fracture the left proximal femur. The appearance suggesting intratrochanteric fracture seems to most likely be due to spurring. Ligaments Suboptimally assessed by  CT. Muscles and Tendons There is likely some mild expansion of the left piriformis muscle due to mild hematoma. Soft  tissues Abdominal aortic atherosclerotic calcification. Sigmoid colon diverticulosis. Small direct left inguinal hernia contains adipose tissue. IMPRESSION: 1. Left pelvic fractures include a vertical fracture of the left sacral ala; acute fracture of the anterior wall of the left acetabulum extending into the lateral attachment of the superior pubic ramus; and a vertically oriented fracture of the left pubic body. 2. I do not see a definite proximal femoral fracture on the left. The appearance of possible intertrochanteric fracture on radiography was probably caused by spurring rather than a femoral fracture. 3. Mild hematoma within or along the left piriformis muscle, not causing significant impingement at the sciatic notch. 4.  Aortic Atherosclerosis (ICD10-I70.0). 5. Sigmoid colon diverticulosis. 6. Small left groin hernia containing adipose tissue. Electronically Signed   By: Van Clines M.D.   On: 02/01/2019 11:05   Dg Shoulder Left  Result Date: 02/01/2019 CLINICAL DATA:  Fall, pain EXAM: LEFT SHOULDER - 2+ VIEW COMPARISON:  None. FINDINGS: No fracture or dislocation is seen. The joint spaces are preserved. Visualized soft tissues are within normal limits. Visualized left lung is clear. IMPRESSION: Negative. Electronically Signed   By: Julian Hy M.D.   On: 02/01/2019 09:45   Dg Knee Complete 4 Views Right  Result Date: 02/01/2019 CLINICAL DATA:  Fall, pain EXAM: RIGHT KNEE - COMPLETE 4+ VIEW COMPARISON:  None. FINDINGS: No fracture or dislocation is seen. The joint spaces are preserved. The visualized soft tissues are unremarkable. No suprapatellar knee joint effusion. IMPRESSION: Negative. Electronically Signed   By: Julian Hy M.D.   On: 02/01/2019 09:42   Dg Hip Unilat W Or Wo Pelvis 2-3 Views Left  Result Date: 02/01/2019 CLINICAL DATA:  Fall, left posterior hip pain EXAM: DG HIP (WITH OR WITHOUT PELVIS) 2-3V LEFT COMPARISON:  None. FINDINGS: Nondisplaced fracture at  least involving the greater trochanter of the left hip. However, subtle lucency through the femoral neck favors a nondisplaced intertrochanteric left hip fracture. Bilateral hip joint spaces are preserved. Visualized bony pelvis appears intact. Degenerative changes of the lower lumbar spine. IMPRESSION: Suspected nondisplaced intertrochanteric left hip fracture, as less likely nondisplaced fracture of the left greater trochanter. Electronically Signed   By: Julian Hy M.D.   On: 02/01/2019 09:44    Review of Systems  Constitutional: Negative for chills and fever.  Respiratory: Negative for shortness of breath.   Cardiovascular: Negative for chest pain and palpitations.  Gastrointestinal: Positive for vomiting (Thinks it is related to pain medication and she has not eaten today).  Musculoskeletal: Positive for back pain.  Neurological: Negative for tingling and sensory change.   Blood pressure (!) 136/56, pulse 89, temperature 99.4 F (37.4 C), temperature source Oral, resp. rate 17, height 5\' 7"  (1.702 m), weight 68 kg, SpO2 99 %. Physical Exam Vitals signs and nursing note reviewed.  Constitutional:      General: She is awake.     Comments: Very pleasant white female appropriate for stated age  HENT:     Head: Normocephalic and atraumatic.  Cardiovascular:     Rate and Rhythm: Normal rate.  Pulmonary:     Effort: Pulmonary effort is normal. No tachypnea, accessory muscle usage or respiratory distress.  Abdominal:     General: Bowel sounds are normal.     Palpations: Abdomen is soft.     Tenderness: There is no abdominal tenderness.  Musculoskeletal:  Comments: Pelvis no traumatic wounds or rash, no ecchymosis, stable to manual stress Mild tenderness with palpation of her lumbosacral region but not severe No significant anterior pelvic ring tenderness  Left Lower Extremity  Inspection: No significant swelling No open wounds No gross deformities  Bony eval: Hip is  nontender No pain with axial loading or logrolling of her hip I can gently range her hip to about 45 degrees of hip flexion without any pain Knee, lower leg and ankle are nontender  Soft tissue: No significant swelling to the left lower extremity Knee and ankle are grossly stable with evaluation Again no traumatic wounds  Sensation: DPN, SPN, TN sensory functions are grossly intact  Motor: EHL, FHL, lesser toe motor function are intact Anterior tibialis, posterior tibialis, peroneals and gastrocsoleus complex motor functions are intact Patient can perform a quad set without difficulty She is able to initiate straight leg raise although some discomfort noted in her back  Vascular: + DP pulse Extremity is warm  No DCT  Compartments are soft and nontender.  No pain with passive stretching   Neurological:     Mental Status: She is alert.  Psychiatric:        Behavior: Behavior is cooperative.      Assessment/Plan:  85 year old female ground-level fall with left LC 1 pelvic ring insufficiency fracture  -Ground-level fall  -Left LC 1 pelvic ring insufficiency fracture  Suspect that patient has some degree of osteoporosis as this is likely reflective of a pathologic fracture related to poor bone density  The fracture itself is stable and she can be weightbearing as tolerated on her left leg  Recommend further work-up for her osteoporosis including outpatient bone density scan within the next several months  No range of motion restrictions  Patient will likely need to use a walker to facilitate ambulation for the next several weeks   Ideally if we can get the patient to work with therapy ASAP and then once she is mobilizing well can discharge her to minimize her in hospital exposure.  I would hope that this would be accomplished by early tomorrow given the current issues associated with the coronavirus pandemic   Admit to medical service  PT and OT evaluation    Suspect  that she will need to discharge home with her daughter for little closer supervision   - Pain management:  Tylenol as needed  Low-dose hydrocodone for severe pain  - ABL anemia/Hemodynamics  Overall looks good  - Medical issues   Per hospitalist  - DVT/PE prophylaxis:  Does not require outpatient pharmacologic DVT and PE prophylaxis from orthopedic standpoint  SCDs while inpatient  - Metabolic Bone Disease:  Check some basic metabolic bone labs while admitted and evaluate for any correctable lab abnormalities  Bone density scan as an outpatient to evaluate for possible pharmacologic treatment of her presumed.   Given her low energy mechanism I suspect that this is likely and this would be a great opportunity to intervene before she sustains a more severe fracture such as a intertrochanteric hip fracture which could really impede her quality of life and given her medical comorbidities could put her in a high-risk category for mortality  - Activity:  Up with assistance  Weight-bear as tolerated left leg  - FEN/GI prophylaxis/Foley/Lines:  Okay to eat  - Impediments to fracture healing:  Chronic medical issues  Poor bone density  - Dispo:  Admit for observation  Therapy eval  Hopeful for discharge tomorrow  Follow-up  with orthopedics in 3 to 4 weeks, we can schedule for a bone density scan at that time     Jari Pigg, PA-C [phone] (C) 02/01/2019, 12:20 PM  [facility] [place] [address] [phone] (F)

## 2019-02-01 NOTE — ED Notes (Signed)
Patient reported sudden onset nausea, vomited x 1 - EDP aware, see new orders.

## 2019-02-01 NOTE — ED Triage Notes (Signed)
Pt coming in by EMS after falling yesterday during the early evening. EMS came on site yesterday but pt refused to seek medical care at hospital, so they helped assist her back to bed. Due to being unable to move and the pain, the pt called EMS again. Pain is in left posterior hip. Hx of UTI, recently finished up antibiotics. Hit head yesterday, no LOC per pt. Pt on Aspirin. 92% on RA, 97% w/ 2 L [place] by EMS

## 2019-02-02 DIAGNOSIS — I5032 Chronic diastolic (congestive) heart failure: Secondary | ICD-10-CM | POA: Diagnosis not present

## 2019-02-02 DIAGNOSIS — I350 Nonrheumatic aortic (valve) stenosis: Secondary | ICD-10-CM | POA: Diagnosis not present

## 2019-02-02 DIAGNOSIS — S3289XA Fracture of other parts of pelvis, initial encounter for closed fracture: Secondary | ICD-10-CM | POA: Diagnosis not present

## 2019-02-02 DIAGNOSIS — Z7401 Bed confinement status: Secondary | ICD-10-CM | POA: Diagnosis not present

## 2019-02-02 DIAGNOSIS — I10 Essential (primary) hypertension: Secondary | ICD-10-CM | POA: Diagnosis not present

## 2019-02-02 DIAGNOSIS — I25118 Atherosclerotic heart disease of native coronary artery with other forms of angina pectoris: Secondary | ICD-10-CM | POA: Diagnosis not present

## 2019-02-02 DIAGNOSIS — M2550 Pain in unspecified joint: Secondary | ICD-10-CM | POA: Diagnosis not present

## 2019-02-02 DIAGNOSIS — R0902 Hypoxemia: Secondary | ICD-10-CM | POA: Diagnosis not present

## 2019-02-02 DIAGNOSIS — S32810A Multiple fractures of pelvis with stable disruption of pelvic ring, initial encounter for closed fracture: Secondary | ICD-10-CM | POA: Diagnosis not present

## 2019-02-02 LAB — CBC
HCT: 32.2 % — ABNORMAL LOW (ref 36.0–46.0)
Hemoglobin: 10 g/dL — ABNORMAL LOW (ref 12.0–15.0)
MCH: 30 pg (ref 26.0–34.0)
MCHC: 31.1 g/dL (ref 30.0–36.0)
MCV: 96.7 fL (ref 80.0–100.0)
Platelets: 132 10*3/uL — ABNORMAL LOW (ref 150–400)
RBC: 3.33 MIL/uL — ABNORMAL LOW (ref 3.87–5.11)
RDW: 13.6 % (ref 11.5–15.5)
WBC: 8.8 10*3/uL (ref 4.0–10.5)
nRBC: 0 % (ref 0.0–0.2)

## 2019-02-02 LAB — RETICULOCYTES
Immature Retic Fract: 9.9 % (ref 2.3–15.9)
RBC.: 3.33 MIL/uL — ABNORMAL LOW (ref 3.87–5.11)
Retic Count, Absolute: 63.6 10*3/uL (ref 19.0–186.0)
Retic Ct Pct: 1.9 % (ref 0.4–3.1)

## 2019-02-02 LAB — FOLATE: FOLATE: 16 ng/mL (ref >5.9)

## 2019-02-02 LAB — CALCITRIOL (1,25 DI-OH VIT D): Vit D, 1,25-Dihydroxy: 34.3 pg/mL (ref 19.9–79.3)

## 2019-02-02 LAB — IRON AND TIBC
Iron: 25 ug/dL — ABNORMAL LOW (ref 28–170)
Saturation Ratios: 8 % — ABNORMAL LOW (ref 10.4–31.8)
TIBC: 308 ug/dL (ref 250–450)
UIBC: 283 ug/dL

## 2019-02-02 LAB — PTH, INTACT AND CALCIUM
Calcium, Total (PTH): 8.6 mg/dL — ABNORMAL LOW (ref 8.7–10.3)
PTH: 45 pg/mL (ref 15–65)

## 2019-02-02 LAB — FERRITIN: Ferritin: 44 ng/mL (ref 11–307)

## 2019-02-02 LAB — VITAMIN D 25 HYDROXY (VIT D DEFICIENCY, FRACTURES): Vit D, 25-Hydroxy: 40.9 ng/mL (ref 30.0–100.0)

## 2019-02-02 LAB — VITAMIN B12: Vitamin B-12: 179 pg/mL — ABNORMAL LOW (ref 180–914)

## 2019-02-02 MED ORDER — CYANOCOBALAMIN 500 MCG PO TABS: 500.0000 ug | ORAL_TABLET | Freq: Every day | ORAL | 0 refills | Status: AC

## 2019-02-02 MED ORDER — TRAMADOL HCL 50 MG PO TABS: 25.0000 mg | ORAL_TABLET | Freq: Four times a day (QID) | ORAL | 0 refills | Status: DC | PRN

## 2019-02-02 MED ORDER — ACETAMINOPHEN 500 MG PO TABS: 500.0000 mg | ORAL_TABLET | Freq: Three times a day (TID) | ORAL | 0 refills | Status: AC

## 2019-02-02 NOTE — TOC Transition Note (Signed)
Transition of Care [facility]) - CM/SW Discharge Note   Patient Details  Name: Amy Kaiser MRN: [id number] Date of Birth: 01/24/1929  [facility] CM/SW Contact:  Ninfa Meeker, RN Phone Number: 02/02/2019, 11:32 AM   Clinical Narrative:   85 yr old female in for observation of left LC pelvic ring fracture. Case manager spoke with patient's daughter, [name] via telephone, due to Covid-19 restrictions. Choice for [place] offered. She has used [facility] before. Referral called to [facility], [facility]. Patient has RW and 3in1 at home. Will have family support at discharge. CM called for [facility] transport Home at 11:10am, they will send next available. CM left message updating patient's daughter.     Final next level of care: [place] Barriers to Discharge: No Barriers Identified   Patient Goals and CMS Choice     Choice offered to / list presented to : Adult Children  Discharge Placement                       Discharge Plan and Services   Discharge Planning Services: CM Consult Post Acute Care Choice: Home Health          DME Arranged: N/A DME Agency: NA HH Arranged: PT HH Agency: [facility] ([facility] [employer]   Social Determinants of Health ([place]) Interventions     Readmission Risk Interventions No flowsheet data found.

## 2019-02-02 NOTE — Evaluation (Signed)
Physical Therapy Evaluation Patient Details Name: Amy Kaiser MRN: [id number] DOB: 06/05/1929 Today's Date: 02/02/2019   History of Present Illness  Amy Kaiser is a 85 y.o. female with past medical history significant for COPD, Diastolic Heart failure, aortic valve stenosis status post TAVR 2018, who presents complaining of hip pain and inability to ambulate since fall on her left hip.  She hit her head also. She how has Lt hip Fx, and pelvic ring Fx. At baseline she uses cane intermittently   Clinical Impression   Pt admitted with above diagnosis. Pt currently with functional limitations in LE strength, standing and walking tolerance and functional mobility. Pt will benefit from skilled PT to increase their independence and safety with mobility to allow discharge to home. It appears she will be discharging home by ambulance today and she is overall mod A with mobility so PT recommending 24 hour supervision.      Follow Up Recommendations Home health PT;Supervision/Assistance - 24 hour    Equipment Recommendations  (she already has BSC and RW at home)       Precautions / Restrictions Precautions Precautions: Fall Restrictions Weight Bearing Restrictions: Yes LLE Weight Bearing: Weight bearing as tolerated      Mobility  Bed Mobility Overal bed mobility: Needs Assistance Bed Mobility: Rolling;Supine to Sit Rolling: Mod assist   Supine to sit: Mod assist        Transfers Overall transfer level: Needs assistance Equipment used: Rolling walker (2 wheeled) Transfers: Stand Pivot Transfers   Stand pivot transfers: Mod assist          Ambulation/Gait Ambulation/Gait assistance: Mod assist Gait Distance (Feet): 5 Feet Assistive device: Rolling walker (2 wheeled) Gait Pattern/deviations: Step-to pattern;Decreased step length - right;Decreased step length - left;Decreased stance time - right;Decreased stance time - left;Decreased stride length;Decreased weight  shift to left Gait velocity: slower Gait velocity interpretation: <1.31 ft/sec, indicative of household ambulator           Balance Overall balance assessment: Needs assistance   Sitting balance-Leahy Scale: Fair       Standing balance-Leahy Scale: Poor Standing balance comment: needs UE support                             Pertinent Vitals/Pain Pain Assessment: 0-10 Pain Score: 5 (during WB, no pain at rest) Pain Location: Lt hip and Rt knee Pain Descriptors / Indicators: Aching Pain Intervention(s): Limited activity within patient's tolerance;Monitored during session;Premedicated before session;Repositioned    Home Living Family/patient expects to be discharged to:: Private residence Living Arrangements: Other relatives(grandchildren) Available Help at Discharge: Family Type of Home: House Home Access: Stairs to enter   Technical brewer of Steps: 2 Home Layout: One level Home Equipment: Bedside commode;Walker - 2 wheels;Cane - single point Additional Comments: will go home by ambulance today staff reports    Prior Function Level of Independence: Independent         Comments: did not drive, only got assistance with mowing yard and vacuum, Ind with cooking, cleaning, bathing, dressing     Hand Dominance   Dominant Hand: Right    Extremity/Trunk Assessment   Upper Extremity Assessment Upper Extremity Assessment: Generalized weakness    Lower Extremity Assessment Lower Extremity Assessment: Generalized weakness    Cervical / Trunk Assessment Cervical / Trunk Assessment: Kyphotic  Communication   Communication: No difficulties  Cognition Arousal/Alertness: Awake/alert Behavior During Therapy: WFL for tasks assessed/performed Overall Cognitive Status:  Within Functional Limits for tasks assessed                                        General Comments General comments (skin integrity, edema, etc.): no issues here     Exercises     Assessment/Plan    PT Assessment Patient needs continued PT services  PT Problem List Decreased strength;Decreased range of motion;Decreased activity tolerance;Decreased balance;Decreased mobility;Decreased coordination;Decreased knowledge of use of DME;Decreased safety awareness;Pain       PT Treatment Interventions DME instruction;Gait training;Stair training;Functional mobility training;Therapeutic activities;Therapeutic exercise;Balance training;Neuromuscular re-education;Manual techniques    PT Goals (Current goals can be found in the Care Plan section)  Acute Rehab PT Goals Patient Stated Goal: discharge home PT Goal Formulation: With patient Time For Goal Achievement: 02/16/19 Potential to Achieve Goals: Good    Frequency Min 3X/week   Barriers to discharge (none)         AM-PAC PT "6 Clicks" Mobility  Outcome Measure Help needed turning from your back to your side while in a flat bed without using bedrails?: A Little Help needed moving from lying on your back to sitting on the side of a flat bed without using bedrails?: A Lot Help needed moving to and from a bed to a chair (including a wheelchair)?: A Lot Help needed standing up from a chair using your arms (e.g., wheelchair or bedside chair)?: A Lot Help needed to walk in hospital room?: A Lot Help needed climbing 3-5 steps with a railing? : A Lot 6 Click Score: 13    End of Session Equipment Utilized During Treatment: Gait belt Activity Tolerance: Patient tolerated treatment well Patient left: in chair;with call bell/phone within reach;with nursing/sitter in room Nurse Communication: Mobility status PT Visit Diagnosis: Unsteadiness on feet (R26.81);Other abnormalities of gait and mobility (R26.89);Repeated falls (R29.6);Muscle weakness (generalized) (M62.81);Difficulty in walking, not elsewhere classified (R26.2);Pain Pain - Right/Left: Left Pain - part of body: Leg    Time: 1050-1130 PT Time  Calculation (min) (ACUTE ONLY): 40 min   Charges:   PT Evaluation $PT Eval Moderate Complexity: 1 Mod PT Treatments $Gait Training: 8-22 mins $Therapeutic Activity: 8-22 mins        Elsie Ra, PT,DPT Acute Rehabilitation Services Office [phone]   Debbe Odea 02/02/2019, 11:52 AM

## 2019-02-02 NOTE — Progress Notes (Addendum)
Orthopedic Trauma Service Progress Note  Patient ID: Amy Kaiser MRN: [id number] DOB/AGE: July 15, 1929 85 y.o.  Subjective:  Doing well this am  Denies any low back pain today  Eating breakfast  Waiting to work with therapies so she can hopefully go home today    ROS As above   Objective:   VITALS:   Vitals:   02/01/19 1030 02/01/19 1320 02/01/19 1923 02/02/19 0408  BP: (!) 136/56 (!) 131/50 (!) 123/46 (!) 133/59  Pulse: 89 77 79 87  Resp: 17 20 15 15   Temp:  98.3 F (36.8 C) 98.6 F (37 C) 98.1 F (36.7 C)  TempSrc:  Oral Oral Oral  SpO2: 99% 97% 91% 92%  Weight:      Height:        Estimated body mass index is 23.49 kg/m as calculated from the following:   Height as of this encounter: 5\' 7"  (1.702 m).   Weight as of this encounter: 68 kg.   Intake/Output      03/29 0701 - 03/30 0700 03/30 0701 - 03/31 0700   P.O.  240   Total Intake(mL/kg)  240 (3.5)   Urine (mL/kg/hr)  250 (1.3)   Total Output  250   Net  -10          LABS  Results for orders placed or performed during the hospital encounter of 02/01/19 (from the past 24 hour(s))  Urinalysis, Routine w reflex microscopic     Status: Abnormal   Collection Time: 02/01/19 12:36 PM  Result Value Ref Range   Color, Urine YELLOW YELLOW   APPearance HAZY (A) CLEAR   Specific Gravity, Urine 1.017 1.005 - 1.030   pH 7.0 5.0 - 8.0   Glucose, UA NEGATIVE NEGATIVE mg/dL   Hgb urine dipstick NEGATIVE NEGATIVE   Bilirubin Urine NEGATIVE NEGATIVE   Ketones, ur 5 (A) NEGATIVE mg/dL   Protein, ur 30 (A) NEGATIVE mg/dL   Nitrite NEGATIVE NEGATIVE   Leukocytes,Ua NEGATIVE NEGATIVE   RBC / HPF 0-5 0 - 5 RBC/hpf   WBC, UA 0-5 0 - 5 WBC/hpf   Bacteria, UA FEW (A) NONE SEEN   Squamous Epithelial / LPF 0-5 0 - 5  Prealbumin     Status: None   Collection Time: 02/01/19  1:31 PM  Result Value Ref Range   Prealbumin 20.1 18 - 38 mg/dL  CBC      Status: Abnormal   Collection Time: 02/02/19  1:47 AM  Result Value Ref Range   WBC 8.8 4.0 - 10.5 K/uL   RBC 3.33 (L) 3.87 - 5.11 MIL/uL   Hemoglobin 10.0 (L) 12.0 - 15.0 g/dL   HCT 32.2 (L) 36.0 - 46.0 %   MCV 96.7 80.0 - 100.0 fL   MCH 30.0 26.0 - 34.0 pg   MCHC 31.1 30.0 - 36.0 g/dL   RDW 13.6 11.5 - 15.5 %   Platelets 132 (L) 150 - 400 K/uL   nRBC 0.0 0.0 - 0.2 %  Vitamin B12     Status: Abnormal   Collection Time: 02/02/19  1:47 AM  Result Value Ref Range   Vitamin B-12 179 (L) 180 - 914 pg/mL  Folate     Status: None   Collection Time: 02/02/19  1:47 AM  Result Value Ref Range  Folate 16.0 >5.9 ng/mL  Iron and TIBC     Status: Abnormal   Collection Time: 02/02/19  1:47 AM  Result Value Ref Range   Iron 25 (L) 28 - 170 ug/dL   TIBC 308 250 - 450 ug/dL   Saturation Ratios 8 (L) 10.4 - 31.8 %   UIBC 283 ug/dL  Ferritin     Status: None   Collection Time: 02/02/19  1:47 AM  Result Value Ref Range   Ferritin 44 11 - 307 ng/mL  Reticulocytes     Status: Abnormal   Collection Time: 02/02/19  1:47 AM  Result Value Ref Range   Retic Ct Pct 1.9 0.4 - 3.1 %   RBC. 3.33 (L) 3.87 - 5.11 MIL/uL   Retic Count, Absolute 63.6 19.0 - 186.0 K/uL   Immature Retic Fract 9.9 2.3 - 15.9 %     PHYSICAL EXAM:   Gen: sitting up in bed, eating breakfast, NAD, appears well  Lungs: breathing unlabored Cardiac: regular  Pelvis: no pain with axial loading of her L leg. No pain with lateral compression of her L hemipelvis  Ext:       Left Lower Extremity   No change in exam since yesterday   Ext warm   + DP pulse  No DCT    Compartments are soft   DPN, SPN, TN sensation intact  EHL, FHL, AT, PT, peroneals, gastroc motor intact    Assessment/Plan:     Active Problems:   S/P TAVR (transcatheter aortic valve replacement)   Essential hypertension   Chronic diastolic CHF (congestive heart failure) (HCC)   Pelvis fracture (HCC)   Pelvic fracture (HCC)   Anti-infectives  (From admission, onward)   None    .  POD/HD#: 30  85 year old female ground-level fall with left LC 1 pelvic ring insufficiency fracture   -Ground-level fall   -Left LC 1 pelvic ring insufficiency fracture             Suspect that patient has some degree of osteoporosis as this is likely reflective of a pathologic fracture related to poor bone density             The fracture itself is stable and she can be weightbearing as tolerated on her left leg             No range of motion restrictions             Patient will likely need to use a walker to facilitate ambulation for the next several weeks              PT/OT evals                          Suspect that she will need to discharge home with her daughter for little closer supervision   Recommend further work-up for her osteoporosis including outpatient bone density scan within the next several months     - Pain management:             Tylenol as needed             Low-dose ultram for severe pain (pt has not had any ultram since admission)   - ABL anemia/Hemodynamics             stable   - Medical issues              Per hospitalist   - DVT/PE prophylaxis:  Does not require outpatient pharmacologic DVT and PE prophylaxis from orthopedic standpoint             SCDs while inpatient   - Metabolic Bone Disease:          labs pending    Will have to review these labs with pt in clinic setting              Bone density scan as an outpatient to evaluate for possible pharmacologic treatment of her presumed.                         Given her low energy mechanism I suspect that this is likely and this would be a great opportunity to intervene before she sustains a more severe fracture such as a intertrochanteric hip fracture which could really impede her quality of life and given her medical comorbidities could put her in a high-risk category for mortality   - Activity:             Up with assistance              Weight-bear as tolerated left leg   - FEN/GI prophylaxis/Foley/Lines:             reg diet    - Impediments to fracture healing:             Chronic medical issues             Poor bone density   - Dispo:             ok to dc home today from ortho standpoint if clears therapy     Weightbearing: WBAT LLE Orthopedic device(s): walker Pain control: tylenol, ultram for severe pain   Follow - up plan: 4 weeks Contact information:  [place] MD, Ainsley Spinner PA   Jari Pigg, PA-C [phone] (C) 02/02/2019, 9:55 AM  [facility] [place] [address] [phone] Domingo Sep (F)

## 2019-02-02 NOTE — Discharge Summary (Signed)
Physician Discharge Summary   Patient ID: Amy Kaiser MRN: [id number] DOB/AGE: 1929/07/04 85 y.o.  Admit date: 02/01/2019 Discharge date: 02/02/2019  Primary Care Physician:  Harlan Stains, MD   Recommendations for Outpatient Follow-up:  1. Follow up with Ortho in 4 weeks. 2. Per orthopedics, no range of motion restrictions, will need home PT  Home Health: home health PT Equipment/Devices:   Discharge Condition: stable  CODE STATUS: FULL  Diet recommendation:    Discharge Diagnoses:    . Pelvis fracture ([place]) . Essential hypertension . Chronic diastolic CHF (congestive heart failure) ([place]) . Pelvic fracture ([facility])   Consults: Orthopedics    Allergies:   Allergies  Allergen Reactions  . Latex Hives, Itching and Other (See Comments)    Caused blisters in her mouth  . Lisinopril Other (See Comments)    Weakness   . Penicillins Other (See Comments)    Intolerance to ALL "CILLINS" > UNSPECIFIED REACTIONS  Has patient had a PCN reaction causing immediate rash, facial/tongue/throat swelling, SOB or lightheadedness with hypotension: UNSPECIFIED REACTION  Has patient had a PCN reaction causing severe rash involving mucus membranes or skin necrosis: UNSPECIFIED REACTION  Has patient had a PCN reaction that required hospitalization UNSPECIFIED REACTION  Has patient had a PCN reaction occurring within the last 10 years: UNSPECIFIED REACTION   . Pravachol [Pravastatin Sodium] Other (See Comments)    MYALGIAs LEG PAIN   . Alendronate Sodium Other (See Comments)    UNSPECIFIED REACTION  [Patient denies this allergy]   . Codeine Nausea And Vomiting  . Morphine And Related Nausea And Vomiting  . Prozac [Fluoxetine Hcl] Anxiety    "FEELS SHAKY"   . Symbicort [Budesonide-Formoterol Fumarate] Anxiety    "SHAKY"   . Wellbutrin [Bupropion] Anxiety    "JITTERY"      DISCHARGE MEDICATIONS: Allergies as of 02/02/2019      Reactions   Latex Hives, Itching, Other  (See Comments)   Caused blisters in her mouth   Lisinopril Other (See Comments)   Weakness   Penicillins Other (See Comments)   Intolerance to ALL "CILLINS" > UNSPECIFIED REACTIONS Has patient had a PCN reaction causing immediate rash, facial/tongue/throat swelling, SOB or lightheadedness with hypotension: UNSPECIFIED REACTION  Has patient had a PCN reaction causing severe rash involving mucus membranes or skin necrosis: UNSPECIFIED REACTION  Has patient had a PCN reaction that required hospitalization UNSPECIFIED REACTION  Has patient had a PCN reaction occurring within the last 10 years: UNSPECIFIED REACTION    Pravachol [pravastatin Sodium] Other (See Comments)   MYALGIAs LEG PAIN   Alendronate Sodium Other (See Comments)   UNSPECIFIED REACTION  [Patient denies this allergy]   Codeine Nausea And Vomiting   Morphine And Related Nausea And Vomiting   Prozac [fluoxetine Hcl] Anxiety   "FEELS SHAKY"   Symbicort [budesonide-formoterol Fumarate] Anxiety   "SHAKY"   Wellbutrin [bupropion] Anxiety   "JITTERY"      Medication List    TAKE these medications   acetaminophen 500 MG tablet Commonly known as:  TYLENOL Take 1 tablet (500 mg total) by mouth every 8 (eight) hours for 10 days.   aspirin EC 81 MG tablet Take 81 mg by mouth daily.   calcium-vitamin D 500-200 MG-UNIT tablet Commonly known as:  OSCAL WITH D Take 1 tablet by mouth 2 (two) times daily.   Flovent HFA 220 MCG/ACT inhaler Generic drug:  fluticasone Take 1 puff by mouth daily as needed (wheezing).   ICAPS AREDS 2 PO Take 1  capsule by mouth 2 (two) times daily.   loperamide 2 MG tablet Commonly known as:  IMODIUM A-D Take 2 mg by mouth as needed for diarrhea or loose stools.   metoprolol tartrate 25 MG tablet Commonly known as:  LOPRESSOR TAKE 1/2 (ONE-HALF) TABLET BY MOUTH TWICE DAILY   nitroGLYCERIN 0.4 MG SL tablet Commonly known as:  NITROSTAT Place 1 tablet (0.4 mg total) under the tongue every 5  (five) minutes x 3 doses as needed for chest pain.   rosuvastatin 10 MG tablet Commonly known as:  CRESTOR Take 10 mg by mouth daily with supper.   traMADol 50 MG tablet Commonly known as:  ULTRAM Take 0.5 tablets (25 mg total) by mouth every 6 (six) hours as needed for moderate pain or severe pain.   Ventolin HFA 108 (90 Base) MCG/ACT inhaler Generic drug:  albuterol Inhale 2 puffs into the lungs 2 (two) times daily as needed for wheezing or shortness of breath.   vitamin B-12 500 MCG tablet Commonly known as:  CYANOCOBALAMIN Take 1 tablet (500 mcg total) by mouth daily.   Vitamin D3 25 MCG (1000 UT) Caps Take 1,000 Units by mouth daily with lunch.            Discharge Care Instructions  (From admission, onward)         Start     Ordered   02/02/19 0000  Weight bearing as tolerated    Question Answer Comment  Laterality bilateral   Extremity Lower      02/02/19 1008           Brief H and P: For complete details please refer to admission H and P, but in brief, 85 y.o. female with past medical history significant for COPD, Diastolic Heart failure, aortic valve stenosis status post TAVR 2018, who presents complaining of hip pain and inability to ambulate since the day prior to admission after patient suffered a mechanical fall.  Patient was doing so on gardening at home when she tripped over something and landed on her left hip.  She hit her head also.  She wake up today with severe pain on her left hip.  She has not been able to ambulate.  She denies chest pain, shortness of breath, fever. CT of the left hip showed left pelvic fractures including a vertical fracture of the left sacral alae acute fracture of the anterior wall of left vestibulum extending into the lateral attachment of the superior pubic ramus.  CT head negative.  Hospital Course:   Acute pelvic fracture sacrum, pubic rami fracture secondary to mechanical fall -Patient was admitted under observation  for pain management, physical therapy -So far doing well with the pain management, awaiting PT evaluation -Orthopedics was consulted, recommended weightbearing as tolerated, home once cleared by PT -Patient was placed on scheduled Tylenol and very low-dose Ultram for severe pain  History of ASD status post TAVR Continue aspirin, metoprolol  History of chronic diastolic CHF Compensated, continue aspirin, metoprolol, not on any diuretics  Mild leukocytosis Resolved, likely due to stress demargination, UA negative for UTI  Chronic anemia B12 low, 179, will place on B12 replacement H&H currently at baseline Outpatient follow-up for B12 levels   Day of Discharge S: Pain controlled, no acute issues, waiting for PT to go home  BP (!) 133/59 (BP Location: Right Arm)   Pulse 87   Temp 98.1 F (36.7 C) (Oral)   Resp 15   Ht 5\' 7"  (1.702 m)  Wt 68 kg   SpO2 92%   BMI 23.49 kg/m   Physical Exam: General: Alert and awake oriented x3 not in any acute distress. HEENT: anicteric sclera, pupils reactive to light and accommodation CVS: S1-S2 clear no murmur rubs or gallops Chest: clear to auscultation bilaterally, no wheezing rales or rhonchi Abdomen: soft nontender, nondistended, normal bowel sounds Extremities: no cyanosis, clubbing or edema noted bilaterally Neuro: Cranial nerves II-XII intact, no focal neurological deficits   The results of significant diagnostics from this hospitalization (including imaging, microbiology, ancillary and laboratory) are listed below for reference.      Procedures/Studies:  Dg Chest 1 View  Result Date: 02/01/2019 CLINICAL DATA:  Fall, left posterior hip pain EXAM: CHEST  1 VIEW COMPARISON:  04/10/2017 FINDINGS: Left basilar opacity, chronic, likely reflecting scarring. Possible trace left pleural effusion. Right lung is clear. No pneumothorax. The heart is normal in size.  Prosthetic aortic valve. IMPRESSION: Possible trace left pleural  effusion. Chronic left basilar opacity/scarring. Electronically Signed   By: Julian Hy M.D.   On: 02/01/2019 09:46   Dg Elbow Complete Left  Result Date: 02/01/2019 CLINICAL DATA:  Fall, pain EXAM: LEFT ELBOW - COMPLETE 3+ VIEW COMPARISON:  None. FINDINGS: No fracture or dislocation is seen. The joint spaces are preserved. Visualized soft tissues are within normal limits. No displaced elbow joint fat pads to suggest an elbow joint effusion. IMPRESSION: Negative. Electronically Signed   By: Julian Hy M.D.   On: 02/01/2019 09:45   Ct Head Wo Contrast  Result Date: 02/01/2019 CLINICAL DATA:  Head injury after fall.  No loss of consciousness. EXAM: CT HEAD WITHOUT CONTRAST CT CERVICAL SPINE WITHOUT CONTRAST TECHNIQUE: Multidetector CT imaging of the head and cervical spine was performed following the standard protocol without intravenous contrast. Multiplanar CT image reconstructions of the cervical spine were also generated. COMPARISON:  CT scan of May 18, 2015. FINDINGS: CT HEAD FINDINGS Brain: Mild diffuse cortical atrophy is noted. No mass effect or midline shift is noted. Ventricular size is within normal limits. There is no evidence of mass lesion, hemorrhage or acute infarction. Vascular: No hyperdense vessel or unexpected calcification. Skull: Normal. Negative for fracture or focal lesion. Sinuses/Orbits: No acute finding. Other: None. CT CERVICAL SPINE FINDINGS Alignment: Minimal grade 1 anterolisthesis of C3-4 is noted secondary to posterior facet joint hypertrophy. Skull base and vertebrae: No acute fracture. No primary bone lesion or focal pathologic process. Soft tissues and spinal canal: No prevertebral fluid or swelling. No visible canal hematoma. Disc levels: Severe degenerative disc disease is noted at C4-5 with anterior posterior osteophyte formation. Moderate degenerative disc disease is noted at C5-6 and C6-7. Upper chest: Negative. Other: Degenerative changes are seen  involving posterior facet joints bilaterally. IMPRESSION: Mild diffuse cortical atrophy. No acute intracranial abnormality seen. Moderate to severe multilevel degenerative disc disease is noted in the cervical spine. No fracture or other acute abnormality is noted. Electronically Signed   By: Marijo Conception, M.D.   On: 02/01/2019 10:04   Ct Cervical Spine Wo Contrast  Result Date: 02/01/2019 CLINICAL DATA:  Head injury after fall.  No loss of consciousness. EXAM: CT HEAD WITHOUT CONTRAST CT CERVICAL SPINE WITHOUT CONTRAST TECHNIQUE: Multidetector CT imaging of the head and cervical spine was performed following the standard protocol without intravenous contrast. Multiplanar CT image reconstructions of the cervical spine were also generated. COMPARISON:  CT scan of May 18, 2015. FINDINGS: CT HEAD FINDINGS Brain: Mild diffuse cortical atrophy is noted. No mass  effect or midline shift is noted. Ventricular size is within normal limits. There is no evidence of mass lesion, hemorrhage or acute infarction. Vascular: No hyperdense vessel or unexpected calcification. Skull: Normal. Negative for fracture or focal lesion. Sinuses/Orbits: No acute finding. Other: None. CT CERVICAL SPINE FINDINGS Alignment: Minimal grade 1 anterolisthesis of C3-4 is noted secondary to posterior facet joint hypertrophy. Skull base and vertebrae: No acute fracture. No primary bone lesion or focal pathologic process. Soft tissues and spinal canal: No prevertebral fluid or swelling. No visible canal hematoma. Disc levels: Severe degenerative disc disease is noted at C4-5 with anterior posterior osteophyte formation. Moderate degenerative disc disease is noted at C5-6 and C6-7. Upper chest: Negative. Other: Degenerative changes are seen involving posterior facet joints bilaterally. IMPRESSION: Mild diffuse cortical atrophy. No acute intracranial abnormality seen. Moderate to severe multilevel degenerative disc disease is noted in the cervical  spine. No fracture or other acute abnormality is noted. Electronically Signed   By: Marijo Conception, M.D.   On: 02/01/2019 10:04   Ct Hip Left Wo Contrast  Result Date: 02/01/2019 CLINICAL DATA:  Left hip pain after fall EXAM: CT OF THE LEFT HIP WITHOUT CONTRAST TECHNIQUE: Multidetector CT imaging of the left hip was performed according to the standard protocol. Multiplanar CT image reconstructions were also generated. COMPARISON:  02/01/2019 radiographs FINDINGS: Bones/Joint/Cartilage Acute vertical fracture of the left sacral ala. Acute fracture of the anterior wall left acetabulum involving the lateral attachment of the superior pubic ramus. Vertically oriented fracture of the left pubic body extending through the superior and inferior pubic ramus margins. I would do not see a discrete fracture the left proximal femur. The appearance suggesting intratrochanteric fracture seems to most likely be due to spurring. Ligaments Suboptimally assessed by CT. Muscles and Tendons There is likely some mild expansion of the left piriformis muscle due to mild hematoma. Soft tissues Abdominal aortic atherosclerotic calcification. Sigmoid colon diverticulosis. Small direct left inguinal hernia contains adipose tissue. IMPRESSION: 1. Left pelvic fractures include a vertical fracture of the left sacral ala; acute fracture of the anterior wall of the left acetabulum extending into the lateral attachment of the superior pubic ramus; and a vertically oriented fracture of the left pubic body. 2. I do not see a definite proximal femoral fracture on the left. The appearance of possible intertrochanteric fracture on radiography was probably caused by spurring rather than a femoral fracture. 3. Mild hematoma within or along the left piriformis muscle, not causing significant impingement at the sciatic notch. 4.  Aortic Atherosclerosis (ICD10-I70.0). 5. Sigmoid colon diverticulosis. 6. Small left groin hernia containing adipose tissue.  Electronically Signed   By: Van Clines M.D.   On: 02/01/2019 11:05   Dg Shoulder Left  Result Date: 02/01/2019 CLINICAL DATA:  Fall, pain EXAM: LEFT SHOULDER - 2+ VIEW COMPARISON:  None. FINDINGS: No fracture or dislocation is seen. The joint spaces are preserved. Visualized soft tissues are within normal limits. Visualized left lung is clear. IMPRESSION: Negative. Electronically Signed   By: Julian Hy M.D.   On: 02/01/2019 09:45   Dg Knee Complete 4 Views Right  Result Date: 02/01/2019 CLINICAL DATA:  Fall, pain EXAM: RIGHT KNEE - COMPLETE 4+ VIEW COMPARISON:  None. FINDINGS: No fracture or dislocation is seen. The joint spaces are preserved. The visualized soft tissues are unremarkable. No suprapatellar knee joint effusion. IMPRESSION: Negative. Electronically Signed   By: Julian Hy M.D.   On: 02/01/2019 09:42   Dg Hip Unilat W Or  Wo Pelvis 2-3 Views Left  Result Date: 02/01/2019 CLINICAL DATA:  Fall, left posterior hip pain EXAM: DG HIP (WITH OR WITHOUT PELVIS) 2-3V LEFT COMPARISON:  None. FINDINGS: Nondisplaced fracture at least involving the greater trochanter of the left hip. However, subtle lucency through the femoral neck favors a nondisplaced intertrochanteric left hip fracture. Bilateral hip joint spaces are preserved. Visualized bony pelvis appears intact. Degenerative changes of the lower lumbar spine. IMPRESSION: Suspected nondisplaced intertrochanteric left hip fracture, as less likely nondisplaced fracture of the left greater trochanter. Electronically Signed   By: Julian Hy M.D.   On: 02/01/2019 09:44      LAB RESULTS: Basic Metabolic Panel: Recent Labs  Lab 02/01/19 0741  NA 137  K 3.7  CL 102  CO2 27  GLUCOSE 127*  BUN 11  CREATININE 0.81  CALCIUM 8.7*   Liver Function Tests: No results for input(s): AST, ALT, ALKPHOS, BILITOT, PROT, ALBUMIN in the last 168 hours. No results for input(s): LIPASE, AMYLASE in the last 168 hours. No  results for input(s): AMMONIA in the last 168 hours. CBC: Recent Labs  Lab 02/01/19 0741 02/02/19 0147  WBC 11.9* 8.8  NEUTROABS 9.3*  --   HGB 10.7* 10.0*  HCT 33.0* 32.2*  MCV 97.1 96.7  PLT 136* 132*   Cardiac Enzymes: No results for input(s): CKTOTAL, CKMB, CKMBINDEX, TROPONINI in the last 168 hours. BNP: Invalid input(s): POCBNP CBG: Recent Labs  Lab 02/01/19 0852  GLUCAP 144*      Disposition and Follow-up: Discharge Instructions    Diet - low sodium heart healthy   Complete by:  As directed    Increase activity slowly   Complete by:  As directed    Weight bearing as tolerated   Complete by:  As directed    Laterality:  bilateral   Extremity:  Lower       DISPOSITION: Home health PT   DISCHARGE FOLLOW-UP Follow-up Information    Harlan Stains, MD. Schedule an appointment as soon as possible for a visit in 2 week(s).   Specialty:  Family Medicine Why:  as needed Contact information: [place], [place] [address] [phone]        Altamese [place], MD. Schedule an appointment as soon as possible for a visit in 4 week(s).   Specialty:  Orthopedic Surgery Contact information: [place] [address] [phone]        [facility] Follow up.   Specialty:  [place] Why:  A representative from [facility] will contact you to arrange start date and time for your therapy. Contact information: [address] [place] [address] [phone]            Time coordinating discharge:  35 minutes  Signed:   Estill Cotta M.D. [facility] 02/02/2019, 1:01 PM

## 2019-02-02 NOTE — Plan of Care (Signed)

## 2019-02-02 NOTE — Progress Notes (Addendum)
Discharge paperwork and instructions given to patient. Patient not in distress and tolerated well.

## 2019-02-02 NOTE — Discharge Instructions (Signed)
Orthopaedic Trauma Service Discharge Instructions   General Discharge Instructions  WEIGHT BEARING STATUS: Weightbearing as tolerated Left leg, use walker to assist ambulation   RANGE OF MOTION/ACTIVITY: unrestricted range of motion Left leg. Activity as tolerated   Wound Care: N/A  Diet: as you were eating previously.  Can use over the counter stool softeners and bowel preparations, such as Miralax, to help with bowel movements.  Narcotics can be constipating.  Be sure to drink plenty of fluids  PAIN MEDICATION USE AND EXPECTATIONS  You have likely been given narcotic medications to help control your pain.  After a traumatic event that results in an fracture (broken bone) with or without surgery, it is ok to use narcotic pain medications to help control one's pain.  We understand that everyone responds to pain differently and each individual patient will be evaluated on a regular basis for the continued need for narcotic medications. Ideally, narcotic medication use should last no more than 6-8 weeks (coinciding with fracture healing).   As a patient it is your responsibility as well to monitor narcotic medication use and report the amount and frequency you use these medications when you come to your office visit.   We would also advise that if you are using narcotic medications, you should take a dose prior to therapy to maximize you participation.  IF YOU ARE ON NARCOTIC MEDICATIONS IT IS NOT PERMISSIBLE TO OPERATE A MOTOR VEHICLE (MOTORCYCLE/CAR/TRUCK/MOPED) OR HEAVY MACHINERY DO NOT MIX NARCOTICS WITH OTHER CNS (CENTRAL NERVOUS SYSTEM) DEPRESSANTS SUCH AS ALCOHOL   STOP SMOKING OR USING NICOTINE PRODUCTS!!!!  As discussed nicotine severely impairs your body's ability to heal surgical and traumatic wounds but also impairs bone healing.  Wounds and bone heal by forming microscopic blood vessels (angiogenesis) and nicotine is a vasoconstrictor (essentially, shrinks blood vessels).   Therefore, if vasoconstriction occurs to these microscopic blood vessels they essentially disappear and are unable to deliver necessary nutrients to the healing tissue.  This is one modifiable factor that you can do to dramatically increase your chances of healing your injury.    (This means no smoking, no nicotine gum, patches, etc)       ICE AND ELEVATE INJURED/OPERATIVE EXTREMITY  Using ice and elevating the injured extremity above your heart can help with swelling and pain control.  Icing in a pulsatile fashion, such as 20 minutes on and 20 minutes off, can be followed.    Do not place ice directly on skin. Make sure there is a barrier between to skin and the ice pack.    Using frozen items such as frozen peas works well as the conform nicely to the are that needs to be iced.  USE AN ACE WRAP OR TED HOSE FOR SWELLING CONTROL  In addition to icing and elevation, Ace wraps or TED hose are used to help limit and resolve swelling.  It is recommended to use Ace wraps or TED hose until you are informed to stop.    When using Ace Wraps start the wrapping distally (farthest away from the body) and wrap proximally (closer to the body)   Example: If you had surgery on your leg or thing and you do not have a splint on, start the ace wrap at the toes and work your way up to the thigh        If you had surgery on your upper extremity and do not have a splint on, start the ace wrap at your fingers and work  your way up to the upper arm  CALL THE OFFICE WITH ANY QUESTIONS OR CONCERNS: [phone]

## 2019-02-03 DIAGNOSIS — D649 Anemia, unspecified: Secondary | ICD-10-CM | POA: Diagnosis not present

## 2019-02-03 DIAGNOSIS — Z9181 History of falling: Secondary | ICD-10-CM | POA: Diagnosis not present

## 2019-02-03 DIAGNOSIS — I110 Hypertensive heart disease with heart failure: Secondary | ICD-10-CM | POA: Diagnosis not present

## 2019-02-03 DIAGNOSIS — Z952 Presence of prosthetic heart valve: Secondary | ICD-10-CM | POA: Diagnosis not present

## 2019-02-03 DIAGNOSIS — J449 Chronic obstructive pulmonary disease, unspecified: Secondary | ICD-10-CM | POA: Diagnosis not present

## 2019-02-03 DIAGNOSIS — E785 Hyperlipidemia, unspecified: Secondary | ICD-10-CM | POA: Diagnosis not present

## 2019-02-03 DIAGNOSIS — S3210XD Unspecified fracture of sacrum, subsequent encounter for fracture with routine healing: Secondary | ICD-10-CM | POA: Diagnosis not present

## 2019-02-03 DIAGNOSIS — Z85828 Personal history of other malignant neoplasm of skin: Secondary | ICD-10-CM | POA: Diagnosis not present

## 2019-02-03 DIAGNOSIS — Z8541 Personal history of malignant neoplasm of cervix uteri: Secondary | ICD-10-CM | POA: Diagnosis not present

## 2019-02-03 DIAGNOSIS — G43909 Migraine, unspecified, not intractable, without status migrainosus: Secondary | ICD-10-CM | POA: Diagnosis not present

## 2019-02-03 DIAGNOSIS — I5032 Chronic diastolic (congestive) heart failure: Secondary | ICD-10-CM | POA: Diagnosis not present

## 2019-02-03 DIAGNOSIS — M1990 Unspecified osteoarthritis, unspecified site: Secondary | ICD-10-CM | POA: Diagnosis not present

## 2019-02-03 DIAGNOSIS — S32502D Unspecified fracture of left pubis, subsequent encounter for fracture with routine healing: Secondary | ICD-10-CM | POA: Diagnosis not present

## 2019-02-03 DIAGNOSIS — F419 Anxiety disorder, unspecified: Secondary | ICD-10-CM | POA: Diagnosis not present

## 2019-02-03 DIAGNOSIS — M84454A Pathological fracture, pelvis, initial encounter for fracture: Secondary | ICD-10-CM | POA: Diagnosis not present

## 2019-02-03 DIAGNOSIS — Z7982 Long term (current) use of aspirin: Secondary | ICD-10-CM | POA: Diagnosis not present

## 2019-02-23 DIAGNOSIS — E538 Deficiency of other specified B group vitamins: Secondary | ICD-10-CM | POA: Diagnosis not present

## 2019-02-23 DIAGNOSIS — I700 Atherosclerosis of aorta: Secondary | ICD-10-CM | POA: Diagnosis not present

## 2019-02-23 DIAGNOSIS — M810 Age-related osteoporosis without current pathological fracture: Secondary | ICD-10-CM | POA: Diagnosis not present

## 2019-02-23 DIAGNOSIS — S3289XD Fracture of other parts of pelvis, subsequent encounter for fracture with routine healing: Secondary | ICD-10-CM | POA: Diagnosis not present

## 2019-03-09 DIAGNOSIS — I5032 Chronic diastolic (congestive) heart failure: Secondary | ICD-10-CM | POA: Diagnosis not present

## 2019-03-09 DIAGNOSIS — F419 Anxiety disorder, unspecified: Secondary | ICD-10-CM | POA: Diagnosis not present

## 2019-03-09 DIAGNOSIS — S32502D Unspecified fracture of left pubis, subsequent encounter for fracture with routine healing: Secondary | ICD-10-CM | POA: Diagnosis not present

## 2019-03-09 DIAGNOSIS — M1990 Unspecified osteoarthritis, unspecified site: Secondary | ICD-10-CM | POA: Diagnosis not present

## 2019-03-09 DIAGNOSIS — Z9181 History of falling: Secondary | ICD-10-CM | POA: Diagnosis not present

## 2019-03-09 DIAGNOSIS — Z7982 Long term (current) use of aspirin: Secondary | ICD-10-CM | POA: Diagnosis not present

## 2019-03-09 DIAGNOSIS — M84454A Pathological fracture, pelvis, initial encounter for fracture: Secondary | ICD-10-CM | POA: Diagnosis not present

## 2019-03-09 DIAGNOSIS — E785 Hyperlipidemia, unspecified: Secondary | ICD-10-CM | POA: Diagnosis not present

## 2019-03-09 DIAGNOSIS — Z85828 Personal history of other malignant neoplasm of skin: Secondary | ICD-10-CM | POA: Diagnosis not present

## 2019-03-09 DIAGNOSIS — G43909 Migraine, unspecified, not intractable, without status migrainosus: Secondary | ICD-10-CM | POA: Diagnosis not present

## 2019-03-09 DIAGNOSIS — S3210XD Unspecified fracture of sacrum, subsequent encounter for fracture with routine healing: Secondary | ICD-10-CM | POA: Diagnosis not present

## 2019-03-09 DIAGNOSIS — I110 Hypertensive heart disease with heart failure: Secondary | ICD-10-CM | POA: Diagnosis not present

## 2019-03-09 DIAGNOSIS — Z952 Presence of prosthetic heart valve: Secondary | ICD-10-CM | POA: Diagnosis not present

## 2019-03-09 DIAGNOSIS — D649 Anemia, unspecified: Secondary | ICD-10-CM | POA: Diagnosis not present

## 2019-03-09 DIAGNOSIS — J449 Chronic obstructive pulmonary disease, unspecified: Secondary | ICD-10-CM | POA: Diagnosis not present

## 2019-03-09 DIAGNOSIS — Z8541 Personal history of malignant neoplasm of cervix uteri: Secondary | ICD-10-CM | POA: Diagnosis not present

## 2019-08-07 ENCOUNTER — Other Ambulatory Visit: Payer: Self-pay | Admitting: Cardiovascular Disease

## 2019-08-07 NOTE — Telephone Encounter (Signed)
This is Dr. [name]'s pt. °

## 2019-08-19 DIAGNOSIS — N3000 Acute cystitis without hematuria: Secondary | ICD-10-CM | POA: Diagnosis not present

## 2019-08-24 ENCOUNTER — Other Ambulatory Visit: Payer: Self-pay | Admitting: Cardiovascular Disease

## 2019-08-25 ENCOUNTER — Other Ambulatory Visit: Payer: Self-pay | Admitting: Cardiovascular Disease

## 2019-10-12 NOTE — Progress Notes (Signed)
Cardiology Office Note  Date:  10/13/2019   ID:  Amy Kaiser, DOB [birth year], MRN [id number]  PCP:  Harlan Stains, MD   Chief Complaint  Patient presents with  . other    12 month follow up. Meds reviewed by the pt. verbally. "doing well."     HPI:  Amy Kaiser is a 85 y.o. Female with PMH of  severe aortic stenosis with progressive symptoms of diastolic heart failure  TAVR with a 20 mm Sapien 3 valve via a percutaneous transfemoral approach 04/09/2017.   severe stenosis of the proximal RCA  stenting with a 4 mm drug-eluting stent 03/11/2017.    In general she reports that she is doing well, active Out in garden.   Reading, Presents in wheelchair/scooter No exercise No falls, legs weak Walks with a cane No chest pain, SOB  Prior echocardiogram reviewed with her from 2019 une 2019, moderately elevated right heart pressures, normal prosthetic aortic valve, moderate MR and ejection fraction 60%  EKG personally reviewed by myself on todays visit Shows normal sinus rhythm rate 64 bpm left axis deviation no other significant ST or T wave changes   PMH:   has a past medical history of Anemia, Anxiety, Arthritis, Asthma, Cervical cancer ([place]), COPD (chronic obstructive pulmonary disease) ([place]), Coronary artery disease, Depression, Heart murmur, Hip fracture ([place]), Hyperlipidemia, Low blood sugar, Migraine, and Skin cancer.  PSH:    Past Surgical History:  Procedure Laterality Date  . APPENDECTOMY    . CARDIAC CATHETERIZATION  02/2017  . CATARACT EXTRACTION W/ INTRAOCULAR LENS  IMPLANT, BILATERAL Bilateral   . CORONARY ANGIOPLASTY WITH STENT PLACEMENT  03/11/2017  . CORONARY STENT INTERVENTION N/A 03/11/2017   Procedure: Coronary Stent Intervention;  Surgeon: Sherren Mocha, MD;  Location: [facility];  Service: Cardiovascular;  Laterality: N/A;  . IR RADIOLOGY PERIPHERAL GUIDED IV START  02/26/2017  . IR US GUIDE VASC ACCESS RIGHT  02/26/2017  . RIGHT/LEFT HEART  CATH AND CORONARY ANGIOGRAPHY N/A 02/20/2017   Procedure: Right/Left Heart Cath and Coronary Angiography;  Surgeon: Sherren Mocha, MD;  Location: [facility];  Service: Cardiovascular;  Laterality: N/A;  . SKIN CANCER EXCISION     "back"  . TEE WITHOUT CARDIOVERSION N/A 04/09/2017   Procedure: TRANSESOPHAGEAL ECHOCARDIOGRAM (TEE);  Surgeon: Sherren Mocha, MD;  Location: [facility];  Service: Open Heart Surgery;  Laterality: N/A;  . TRANSCATHETER AORTIC VALVE REPLACEMENT, TRANSFEMORAL N/A 04/09/2017   Procedure: TRANSCATHETER AORTIC VALVE REPLACEMENT, TRANSFEMORAL;  Surgeon: Sherren Mocha, MD;  Location: [facility];  Service: Open Heart Surgery;  Laterality: N/A;  . VAGINAL HYSTERECTOMY      Current Outpatient Medications  Medication Sig Dispense Refill  . aspirin EC 81 MG tablet Take 81 mg by mouth daily.    . calcium-vitamin D (OSCAL WITH D) 500-200 MG-UNIT tablet Take 1 tablet by mouth 2 (two) times daily.    . Cholecalciferol (VITAMIN D3) 1000 units CAPS Take 1,000 Units by mouth daily with lunch.     Cristy Friedlander HFA 220 MCG/ACT inhaler Take 1 puff by mouth daily as needed (wheezing).   12  . loperamide (IMODIUM A-D) 2 MG tablet Take 2 mg by mouth as needed for diarrhea or loose stools.    . metoprolol tartrate (LOPRESSOR) 25 MG tablet Take 1/2 (one-half) tablet by mouth twice daily 90 tablet 0  . Multiple Vitamins-Minerals (ICAPS AREDS 2 PO) Take 1 capsule by mouth 2 (two) times daily.    . nitroGLYCERIN (NITROSTAT) 0.4  MG SL tablet DISSOLVE ONE TABLET UNDER THE TONGUE EVERY 5 MINUTES AS NEEDED FOR CHEST PAIN.  DO NOT EXCEED A TOTAL OF 3 DOSES IN 15 MINUTES NOW 25 tablet 0  . rosuvastatin (CRESTOR) 20 MG tablet Take 20 mg by mouth daily.    . traMADol (ULTRAM) 50 MG tablet Take 0.5 tablets (25 mg total) by mouth every 6 (six) hours as needed for moderate pain or severe pain. 30 tablet 0  . VENTOLIN HFA 108 (90 BASE) MCG/ACT inhaler Inhale 2 puffs into the lungs 2 (two) times daily as needed for  wheezing or shortness of breath.   2  . vitamin B-12 (CYANOCOBALAMIN) 500 MCG tablet Take 1 tablet (500 mcg total) by mouth daily. 90 tablet 0   No current facility-administered medications for this visit.      Allergies:   Latex, Lisinopril, Penicillins, Pravachol [pravastatin sodium], Alendronate sodium, Codeine, Morphine and related, Prozac [fluoxetine hcl], Symbicort [budesonide-formoterol fumarate], and Wellbutrin [bupropion]   Social History:  The patient  reports that she has never smoked. She has never used smokeless tobacco. She reports that she does not drink alcohol or use drugs.   Family History:   family history is not on file.    Review of Systems: Review of Systems  Constitutional: Negative.   Respiratory: Negative.   Cardiovascular: Negative.   Gastrointestinal: Negative.   Musculoskeletal: Negative.   Neurological: Negative.   Psychiatric/Behavioral: Negative.   All other systems reviewed and are negative.    PHYSICAL EXAM: VS:  BP 120/80 (BP Location: Left Arm, Patient Position: Sitting, Cuff Size: Normal)   Pulse 64   Temp 98.1 F (36.7 C)   Ht 5' 6.5" (1.689 m)   Wt 175 lb 8 oz (79.6 kg)   BMI 27.90 kg/m  , BMI Body mass index is 27.9 kg/m. GEN: Well nourished, well developed, in no acute distress  HEENT: normal  Neck: no JVD, carotid bruits, or masses Cardiac: RRR; no murmurs, rubs, or gallops,no edema  Respiratory:  clear to auscultation bilaterally, normal work of breathing GI: soft, nontender, nondistended, + BS MS: no deformity or atrophy  Skin: warm and dry, no rash Neuro:  Strength and sensation are intact Psych: euthymic mood, full affect  Recent Labs: 02/01/2019: BUN 11; Creatinine, Ser 0.81; Potassium 3.7; Sodium 137 02/02/2019: Hemoglobin 10.0; Platelets 132    Lipid Panel No results found for: CHOL, HDL, LDLCALC, TRIG    Wt Readings from Last 3 Encounters:  10/13/19 175 lb 8 oz (79.6 kg)  02/01/19 150 lb (68 kg)  12/25/18 178  lb (80.7 kg)      ASSESSMENT AND PLAN:  Chronic diastolic CHF (congestive heart failure) (HCC) Appears euvolemic, no changes to her medications  Coronary artery disease with exertional angina ([place]) - Plan: EKG 12-Lead Currently with no symptoms of angina. No further workup at this time. Continue current medication regimen. Stable  Severe aortic stenosis - Plan: EKG 12-Lead Tolerated surgery well, aortic valve imaged June 2019, performing well She prefers repeat echocardiogram 2021 Results discussed with her No significant murmur on exam  S/P TAVR (transcatheter aortic valve replacement) Recent echocardiogram results reviewed with her Doing well, denies shortness of breath leg swelling Prefers repeat echo next year as she is doing so well  Essential hypertension Blood pressure is well controlled on today's visit. No changes made to the medications.   Disposition:   F/U  12 months   Total encounter time more than 25 minutes  Greater than 50%  was spent in counseling and coordination of care with the patient    No orders of the defined types were placed in this encounter.    Signed, Esmond Plants, M.D., Ph.D. 10/13/2019  [place], [place]

## 2019-10-13 ENCOUNTER — Encounter: Payer: Self-pay | Admitting: Cardiovascular Disease

## 2019-10-13 ENCOUNTER — Other Ambulatory Visit: Payer: Self-pay

## 2019-10-13 ENCOUNTER — Ambulatory Visit (INDEPENDENT_AMBULATORY_CARE_PROVIDER_SITE_OTHER): Payer: Medicare Other | Admitting: Cardiovascular Disease

## 2019-10-13 VITALS — BP 120/80 | HR 64 | Temp 98.1°F | Ht 66.5 in | Wt 175.5 lb

## 2019-10-13 DIAGNOSIS — Z952 Presence of prosthetic heart valve: Secondary | ICD-10-CM | POA: Diagnosis not present

## 2019-10-13 DIAGNOSIS — Z23 Encounter for immunization: Secondary | ICD-10-CM | POA: Diagnosis not present

## 2019-10-13 DIAGNOSIS — I350 Nonrheumatic aortic (valve) stenosis: Secondary | ICD-10-CM | POA: Diagnosis not present

## 2019-10-13 DIAGNOSIS — I10 Essential (primary) hypertension: Secondary | ICD-10-CM

## 2019-10-13 DIAGNOSIS — I5032 Chronic diastolic (congestive) heart failure: Secondary | ICD-10-CM

## 2019-10-13 DIAGNOSIS — I25118 Atherosclerotic heart disease of native coronary artery with other forms of angina pectoris: Secondary | ICD-10-CM | POA: Diagnosis not present

## 2019-10-13 NOTE — Patient Instructions (Addendum)
FLU shot today   Medication Instructions:  No changes  If you need a refill on your cardiac medications before your next appointment, please call your pharmacy.    Lab work: No new labs needed   If you have labs (blood work) drawn today and your tests are completely normal, you will receive your results only by: Marland Kitchen MyChart Message (if you have MyChart) OR . A paper copy in the mail If you have any lab test that is abnormal or we need to change your treatment, we will call you to review the results.   Testing/Procedures: No new testing needed   Follow-Up: At [facility], you and your health needs are our priority.  As part of our continuing mission to provide you with exceptional heart care, we have created designated Provider Care Teams.  These Care Teams include your primary Cardiologist (physician) and Advanced Practice Providers (APPs -  Physician Assistants and Nurse Practitioners) who all work together to provide you with the care you need, when you need it.  . You will need a follow up appointment in 12 months .  Marland Kitchen Providers on your designated Care Team:   . Murray Hodgkins, NP . Christell Faith, PA-C . Marrianne Mood, PA-C  Any Other Special Instructions Will Be Listed Below (If Applicable).  For educational health videos Log in to : [email or web address] Or : [email or web address], password : [email or web address]

## 2019-11-16 ENCOUNTER — Other Ambulatory Visit: Payer: Self-pay | Admitting: Cardiovascular Disease

## 2019-11-17 NOTE — Telephone Encounter (Signed)
Please advise if ok to refill Metoprolol Tartrate 25 mg tablet 1/2 table bid. Medication originally prescribed by Burt Knack.

## 2019-11-17 NOTE — Telephone Encounter (Signed)
This is Dr. Donivan Scull pt. Pt sees Dr. Burt Knack as needed. Please address

## 2019-12-31 ENCOUNTER — Ambulatory Visit: Payer: Medicare Other | Attending: Internal Medicine

## 2019-12-31 DIAGNOSIS — Z23 Encounter for immunization: Secondary | ICD-10-CM | POA: Insufficient documentation

## 2019-12-31 NOTE — Progress Notes (Signed)
   Covid-19 Vaccination Clinic  Name:  Amy Kaiser    MRN: [id number] DOB: [birth year]  12/31/2019  [name] was observed post Covid-19 immunization for 15 minutes without incidence. She was provided with Vaccine Information Sheet and instruction to access the V-Safe system.   [name] was instructed to call 911 with any severe reactions post vaccine: Marland Kitchen Difficulty breathing  . Swelling of your face and throat  . A fast heartbeat  . A bad rash all over your body  . Dizziness and weakness    Immunizations Administered    Name Date Dose VIS Date Route   Pfizer COVID-19 Vaccine 12/31/2019 11:16 AM 0.3 mL 10/16/2019 Intramuscular   Manufacturer: [place]   Lot: [id number]   [place]: [id number]

## 2020-01-27 ENCOUNTER — Ambulatory Visit: Payer: Medicare Other | Attending: Internal Medicine

## 2020-01-27 DIAGNOSIS — Z23 Encounter for immunization: Secondary | ICD-10-CM

## 2020-01-27 NOTE — Progress Notes (Signed)
   Covid-19 Vaccination Clinic  Name:  Amy Kaiser    MRN: [id number] DOB: 1929/01/05  01/27/2020  [name] was observed post Covid-19 immunization for 15 minutes without incident. She was provided with Vaccine Information Sheet and instruction to access the V-Safe system.   [name] was instructed to call 911 with any severe reactions post vaccine: Marland Kitchen Difficulty breathing  . Swelling of face and throat  . A fast heartbeat  . A bad rash all over body  . Dizziness and weakness   Immunizations Administered    Name Date Dose VIS Date Route   Pfizer COVID-19 Vaccine 01/27/2020 10:27 AM 0.3 mL 10/16/2019 Intramuscular   Manufacturer: [place]   Lot: [id number]   [place]: [id number]

## 2020-02-15 DIAGNOSIS — R300 Dysuria: Secondary | ICD-10-CM | POA: Diagnosis not present

## 2020-02-15 DIAGNOSIS — N3001 Acute cystitis with hematuria: Secondary | ICD-10-CM | POA: Diagnosis not present

## 2020-02-15 DIAGNOSIS — R030 Elevated blood-pressure reading, without diagnosis of hypertension: Secondary | ICD-10-CM | POA: Diagnosis not present

## 2020-03-31 DIAGNOSIS — I10 Essential (primary) hypertension: Secondary | ICD-10-CM | POA: Diagnosis not present

## 2020-03-31 DIAGNOSIS — E785 Hyperlipidemia, unspecified: Secondary | ICD-10-CM | POA: Diagnosis not present

## 2020-03-31 DIAGNOSIS — R300 Dysuria: Secondary | ICD-10-CM | POA: Diagnosis not present

## 2020-04-14 DIAGNOSIS — Z961 Presence of intraocular lens: Secondary | ICD-10-CM | POA: Diagnosis not present

## 2020-04-14 DIAGNOSIS — H353131 Nonexudative age-related macular degeneration, bilateral, early dry stage: Secondary | ICD-10-CM | POA: Diagnosis not present

## 2020-04-14 DIAGNOSIS — H0102B Squamous blepharitis left eye, upper and lower eyelids: Secondary | ICD-10-CM | POA: Diagnosis not present

## 2020-04-14 DIAGNOSIS — H0102A Squamous blepharitis right eye, upper and lower eyelids: Secondary | ICD-10-CM | POA: Diagnosis not present

## 2020-04-19 DIAGNOSIS — N390 Urinary tract infection, site not specified: Secondary | ICD-10-CM | POA: Diagnosis not present

## 2020-04-19 DIAGNOSIS — R443 Hallucinations, unspecified: Secondary | ICD-10-CM | POA: Diagnosis not present

## 2020-04-20 DIAGNOSIS — N390 Urinary tract infection, site not specified: Secondary | ICD-10-CM | POA: Diagnosis not present

## 2020-04-29 DIAGNOSIS — R300 Dysuria: Secondary | ICD-10-CM | POA: Diagnosis not present

## 2020-06-02 DIAGNOSIS — M810 Age-related osteoporosis without current pathological fracture: Secondary | ICD-10-CM | POA: Diagnosis not present

## 2020-06-02 DIAGNOSIS — F324 Major depressive disorder, single episode, in partial remission: Secondary | ICD-10-CM | POA: Diagnosis not present

## 2020-06-02 DIAGNOSIS — F419 Anxiety disorder, unspecified: Secondary | ICD-10-CM | POA: Diagnosis not present

## 2020-06-02 DIAGNOSIS — E785 Hyperlipidemia, unspecified: Secondary | ICD-10-CM | POA: Diagnosis not present

## 2020-06-06 ENCOUNTER — Encounter: Payer: Self-pay | Admitting: Neurology

## 2020-06-06 ENCOUNTER — Other Ambulatory Visit: Payer: Self-pay | Admitting: Family Medicine

## 2020-06-06 DIAGNOSIS — R441 Visual hallucinations: Secondary | ICD-10-CM

## 2020-06-29 ENCOUNTER — Other Ambulatory Visit: Payer: Medicare Other

## 2020-06-29 ENCOUNTER — Other Ambulatory Visit: Payer: Self-pay

## 2020-06-29 ENCOUNTER — Ambulatory Visit
Admission: RE | Admit: 2020-06-29 | Discharge: 2020-06-29 | Disposition: A | Discharge status: Home / Self Care | Payer: Medicare Other | Source: Ambulatory Visit | Attending: Family Medicine | Admitting: Family Medicine

## 2020-06-29 DIAGNOSIS — J3489 Other specified disorders of nose and nasal sinuses: Secondary | ICD-10-CM | POA: Diagnosis not present

## 2020-06-29 DIAGNOSIS — R441 Visual hallucinations: Secondary | ICD-10-CM

## 2020-06-29 DIAGNOSIS — G9389 Other specified disorders of brain: Secondary | ICD-10-CM | POA: Diagnosis not present

## 2020-06-29 DIAGNOSIS — R9082 White matter disease, unspecified: Secondary | ICD-10-CM | POA: Diagnosis not present

## 2020-06-29 DIAGNOSIS — I6782 Cerebral ischemia: Secondary | ICD-10-CM | POA: Diagnosis not present

## 2020-08-23 DIAGNOSIS — N390 Urinary tract infection, site not specified: Secondary | ICD-10-CM | POA: Diagnosis not present

## 2020-08-23 DIAGNOSIS — R410 Disorientation, unspecified: Secondary | ICD-10-CM | POA: Diagnosis not present

## 2020-08-23 DIAGNOSIS — G459 Transient cerebral ischemic attack, unspecified: Secondary | ICD-10-CM | POA: Diagnosis not present

## 2020-08-23 DIAGNOSIS — R519 Headache, unspecified: Secondary | ICD-10-CM | POA: Diagnosis not present

## 2020-08-29 ENCOUNTER — Other Ambulatory Visit: Payer: Self-pay | Admitting: Family Medicine

## 2020-08-29 DIAGNOSIS — G459 Transient cerebral ischemic attack, unspecified: Secondary | ICD-10-CM

## 2020-09-06 ENCOUNTER — Ambulatory Visit
Admission: RE | Admit: 2020-09-06 | Discharge: 2020-09-06 | Disposition: A | Discharge status: Home / Self Care | Payer: Medicare Other | Source: Ambulatory Visit | Attending: Family Medicine | Admitting: Family Medicine

## 2020-09-06 DIAGNOSIS — H539 Unspecified visual disturbance: Secondary | ICD-10-CM | POA: Diagnosis not present

## 2020-09-06 DIAGNOSIS — E785 Hyperlipidemia, unspecified: Secondary | ICD-10-CM | POA: Diagnosis not present

## 2020-09-06 DIAGNOSIS — G459 Transient cerebral ischemic attack, unspecified: Secondary | ICD-10-CM

## 2020-09-06 DIAGNOSIS — H349 Unspecified retinal vascular occlusion: Secondary | ICD-10-CM | POA: Diagnosis not present

## 2020-09-06 DIAGNOSIS — I10 Essential (primary) hypertension: Secondary | ICD-10-CM | POA: Diagnosis not present

## 2020-09-07 DIAGNOSIS — G459 Transient cerebral ischemic attack, unspecified: Secondary | ICD-10-CM | POA: Diagnosis not present

## 2020-09-09 ENCOUNTER — Ambulatory Visit: Payer: Medicare Other | Admitting: Neurology

## 2020-09-09 ENCOUNTER — Other Ambulatory Visit: Payer: Self-pay

## 2020-09-09 ENCOUNTER — Encounter: Payer: Self-pay | Admitting: Neurology

## 2020-09-09 VITALS — BP 165/76 | HR 68 | Ht 66.5 in | Wt 174.6 lb

## 2020-09-09 DIAGNOSIS — G301 Alzheimer's disease with late onset: Secondary | ICD-10-CM | POA: Diagnosis not present

## 2020-09-09 DIAGNOSIS — F0281 Dementia in other diseases classified elsewhere with behavioral disturbance: Secondary | ICD-10-CM | POA: Diagnosis not present

## 2020-09-09 DIAGNOSIS — R413 Other amnesia: Secondary | ICD-10-CM

## 2020-09-09 DIAGNOSIS — R441 Visual hallucinations: Secondary | ICD-10-CM | POA: Diagnosis not present

## 2020-09-09 DIAGNOSIS — F02818 Dementia in other diseases classified elsewhere, unspecified severity, with other behavioral disturbance: Secondary | ICD-10-CM

## 2020-09-09 NOTE — Patient Instructions (Signed)
Good to meet you. Continue to work on taking medications regularly and increasing water intake. Follow-up in 6-8 months, call for any changes   FALL PRECAUTIONS: Be cautious when walking. Scan the area for obstacles that may increase the risk of trips and falls. When getting up in the mornings, sit up at the edge of the bed for a few minutes before getting out of bed. Consider elevating the bed at the head end to avoid drop of blood pressure when getting up. Walk always in a well-lit room (use night lights in the walls). Avoid area rugs or power cords from appliances in the middle of the walkways. Use a walker or a cane if necessary and consider physical therapy for balance exercise. Get your eyesight checked regularly.  FINANCIAL OVERSIGHT: Supervision, especially oversight when making financial decisions or transactions is also recommended.  HOME SAFETY: Consider the safety of the kitchen when operating appliances like stoves, microwave oven, and blender. Consider having supervision and share cooking responsibilities until no longer able to participate in those. Accidents with firearms and other hazards in the house should be identified and addressed as well.  ABILITY TO BE LEFT ALONE: If patient is unable to contact 911 operator, consider using LifeLine, or when the need is there, arrange for someone to stay with patients. Smoking is a fire hazard, consider supervision or cessation. Risk of wandering should be assessed by caregiver and if detected at any point, supervision and safe proof recommendations should be instituted.  MEDICATION SUPERVISION: Inability to self-administer medication needs to be constantly addressed. Implement a mechanism to ensure safe administration of the medications.  RECOMMENDATIONS FOR ALL PATIENTS WITH MEMORY PROBLEMS: 1. Continue to exercise (Recommend 30 minutes of walking everyday, or 3 hours every week) 2. Increase social interactions - continue going to [place] and  enjoy social gatherings with friends and family 3. Eat healthy, avoid fried foods and eat more fruits and vegetables 4. Maintain adequate blood pressure, blood sugar, and blood cholesterol level. Reducing the risk of stroke and cardiovascular disease also helps promoting better memory. 5. Avoid stressful situations. Live a simple life and avoid aggravations. Organize your time and prepare for the next day in anticipation. 6. Sleep well, avoid any interruptions of sleep and avoid any distractions in the bedroom that may interfere with adequate sleep quality 7. Avoid sugar, avoid sweets as there is a strong link between excessive sugar intake, diabetes, and cognitive impairment We discussed the Mediterranean diet, which has been shown to help patients reduce the risk of progressive memory disorders and reduces cardiovascular risk. This includes eating fish, eat fruits and green leafy vegetables, nuts like almonds and hazelnuts, walnuts, and also use olive oil. Avoid fast foods and fried foods as much as possible. Avoid sweets and sugar as sugar use has been linked to worsening of memory function.  There is always a concern of gradual progression of memory problems. If this is the case, then we may need to adjust level of care according to patient needs. Support, both to the patient and caregiver, should then be put into place.

## 2020-09-09 NOTE — Progress Notes (Signed)
NEUROLOGY CONSULTATION NOTE  [name] MRN: [id number] DOB: 1929/07/03  Referring provider: Marilynne Drivers, PA Primary care provider: Dr. Harlan Stains  Reason for consult:  Visual hallucinations  Thank you for your kind referral of Amy Kaiser for consultation of the above symptoms. Although her history is well known to you, please allow me to reiterate it for the purpose of our medical record. The patient was accompanied to the clinic by her friend Amy Kaiser who also provides collateral information. Records and images were personally reviewed where available.   HISTORY OF PRESENT ILLNESS: This is an 86 year old right-handed woman with a history of hyperlipidemia, migraines, COPD, cervical cancer, depression, anxiety, presenting for evaluation of visual hallucinations. Amy Kaiser, her friend of 26 years, is present to provide additional information. After the visit, I also spoke to her daughter Amy Kaiser on the phone. She lives alone. She does not think her memory is good at all. Her daughter started noticing changes for quite a while, she lost her husband, then her 2 sons, worse in the past 2-3 years when her second son passed away. She repeats herself. She reports she has a terrible time remembering her medications, worse in the past year. She would not recall if she took it already. They tried different ways, she had difficulty with a pillbox. Her daughter and granddaughter live across the street, they come and put medications in her pillbox, calling her daily to remind to take them. Her daughter took over finances last year. She does not drive. She stopped driving after heart surgery years ago when she was "losing control" of her body, no loss of consciousness. This is not occurring any longer. She has not been bathing as much as she wants to due to her right knee or dizziness, she does not feel safe. She does regular "bird baths." She has lost her appetite, her daughter brings food.  She had burned a couple of pots. She gets lost in her own house, thinking she is in her bedroom then waking up in the dining room not knowing where she was.  [name] denies any personality changes.   She started having hallucinations within the past year. Her daughter reports hallucinations worsened when a friend of hers was diagnosed with cancer and passed away around 6 months ago. They are sporadic, she called her daughter at 2AM one time asking where her baby was, another time she saw a snake at 2AM. She thought her sons (who have passed away) visited her, she saw them at the door and heard them, but did not find them in the living room after. The last time she had a hallucination was 3 months ago, she saw her daughter-in-law in the yard. [name] adds that one time she saw a snake in her covers, she was confused and found to have a UTI. She has a history of frequent UTIs. She reports that her mother had visual hallucinations in her late 40s and saw bugs on her.  She reports terrible headaches which are new, at one point she had 3 days where she took Tylenol twice a day. Headaches have quieted down, she has had 1 or 2 since then. She denies any dizziness, diplopia, dysarthria/dysphagia, neck/back pain, focal numbness/tingling/weakness, bowel/bladder dysfunction. She has tremors/"shakes" when her glucose levels are low, none if eating fine. No anosmia. Over the past 2 years, she stumbles around a lot and uses a cane outside. Sleep is good, no clear REM behavior disorder. No history  of significant head injuries or alcohol use. .  I personally reviewed MRI brain without contrast done 06/2020 which did not show any acute changes. There was mild diffuse atrophy, moderate chronic microvascular disease.   PAST MEDICAL HISTORY: Past Medical History:  Diagnosis Date  . Anemia   . Anxiety   . Arthritis    "just about all over" (03/11/2017)  . Asthma   . Cervical cancer ([facility])   . COPD (chronic obstructive  pulmonary disease) ([facility])   . Coronary artery disease   . Depression   . Heart murmur   . Hip fracture ([facility])    left hip  . Hyperlipidemia   . Low blood sugar   . Migraine    "work related; quit when I quit work in ~ 1997" (03/11/2017)  . Skin cancer    back    PAST SURGICAL HISTORY: Past Surgical History:  Procedure Laterality Date  . APPENDECTOMY    . CARDIAC CATHETERIZATION  02/2017  . CATARACT EXTRACTION W/ INTRAOCULAR LENS  IMPLANT, BILATERAL Bilateral   . CORONARY ANGIOPLASTY WITH STENT PLACEMENT  03/11/2017  . CORONARY STENT INTERVENTION N/A 03/11/2017   Procedure: Coronary Stent Intervention;  Surgeon: Sherren Mocha, MD;  Location: [facility];  Service: Cardiovascular;  Laterality: N/A;  . IR RADIOLOGY PERIPHERAL GUIDED IV START  02/26/2017  . IR US GUIDE VASC ACCESS RIGHT  02/26/2017  . RIGHT/LEFT HEART CATH AND CORONARY ANGIOGRAPHY N/A 02/20/2017   Procedure: Right/Left Heart Cath and Coronary Angiography;  Surgeon: Sherren Mocha, MD;  Location: [facility];  Service: Cardiovascular;  Laterality: N/A;  . SKIN CANCER EXCISION     "back"  . TEE WITHOUT CARDIOVERSION N/A 04/09/2017   Procedure: TRANSESOPHAGEAL ECHOCARDIOGRAM (TEE);  Surgeon: Sherren Mocha, MD;  Location: [facility];  Service: Open Heart Surgery;  Laterality: N/A;  . TRANSCATHETER AORTIC VALVE REPLACEMENT, TRANSFEMORAL N/A 04/09/2017   Procedure: TRANSCATHETER AORTIC VALVE REPLACEMENT, TRANSFEMORAL;  Surgeon: Sherren Mocha, MD;  Location: [facility];  Service: Open Heart Surgery;  Laterality: N/A;  . VAGINAL HYSTERECTOMY      MEDICATIONS: Current Outpatient Medications on File Prior to Visit  Medication Sig Dispense Refill  . aspirin EC 81 MG tablet Take 81 mg by mouth daily.    . calcium-vitamin D (OSCAL WITH D) 500-200 MG-UNIT tablet Take 1 tablet by mouth 2 (two) times daily.    Marland Kitchen FLOVENT HFA 220 MCG/ACT inhaler Take 1 puff by mouth daily as needed (wheezing).   12  . loperamide (IMODIUM A-D) 2 MG  tablet Take 2 mg by mouth as needed for diarrhea or loose stools.    . metoprolol tartrate (LOPRESSOR) 25 MG tablet Take 1/2 tablet (12.5 mg) by mouth twice a day 90 tablet 3  . Multiple Vitamins-Minerals (ICAPS AREDS 2 PO) Take 1 capsule by mouth 2 (two) times daily.    . nitroGLYCERIN (NITROSTAT) 0.4 MG SL tablet DISSOLVE ONE TABLET UNDER THE TONGUE EVERY 5 MINUTES AS NEEDED FOR CHEST PAIN.  DO NOT EXCEED A TOTAL OF 3 DOSES IN 15 MINUTES NOW 25 tablet 0  . rosuvastatin (CRESTOR) 20 MG tablet Take 20 mg by mouth daily.    . vitamin B-12 (CYANOCOBALAMIN) 500 MCG tablet Take 1 tablet (500 mcg total) by mouth daily. 90 tablet 0  . Cholecalciferol (VITAMIN D3) 1000 units CAPS Take 1,000 Units by mouth daily with lunch.  (Patient not taking: Reported on 09/09/2020)     No current facility-administered medications on file prior to visit.  ALLERGIES: Allergies  Allergen Reactions  . Latex Hives, Itching and Other (See Comments)    Caused blisters in her mouth  . Lisinopril Other (See Comments)    Weakness   . Penicillins Other (See Comments)    Intolerance to ALL "CILLINS" > UNSPECIFIED REACTIONS  Has patient had a PCN reaction causing immediate rash, facial/tongue/throat swelling, SOB or lightheadedness with hypotension: UNSPECIFIED REACTION  Has patient had a PCN reaction causing severe rash involving mucus membranes or skin necrosis: UNSPECIFIED REACTION  Has patient had a PCN reaction that required hospitalization UNSPECIFIED REACTION  Has patient had a PCN reaction occurring within the last 10 years: UNSPECIFIED REACTION   . Pravachol [Pravastatin Sodium] Other (See Comments)    MYALGIAs LEG PAIN   . Alendronate Sodium Other (See Comments)    UNSPECIFIED REACTION  [Patient denies this allergy]   . Codeine Nausea And Vomiting  . Morphine And Related Nausea And Vomiting  . Prozac [Fluoxetine Hcl] Anxiety    "FEELS SHAKY"   . Symbicort [Budesonide-Formoterol Fumarate] Anxiety     "SHAKY"   . Wellbutrin [Bupropion] Anxiety    "JITTERY"     FAMILY HISTORY: History reviewed. No pertinent family history.  SOCIAL HISTORY: Social History   Socioeconomic History  . Marital status: Widowed    Spouse name: Not on file  . Number of children: 3  . Years of education: Not on file  . Highest education level: Not on file  Occupational History  . Not on file  Tobacco Use  . Smoking status: Never Smoker  . Smokeless tobacco: Never Used  Vaping Use  . Vaping Use: Never used  Substance and Sexual Activity  . Alcohol use: No  . Drug use: No  . Sexual activity: Not Currently  Other Topics Concern  . Not on file  Social History Narrative   Right handed    Lives alone   Social Determinants of Health   Financial Resource Strain:   . Difficulty of Paying Living Expenses: Not on file  Food Insecurity:   . Worried About Charity fundraiser in the Last Year: Not on file  . Ran Out of Food in the Last Year: Not on file  Transportation Needs:   . Lack of Transportation (Medical): Not on file  . Lack of Transportation (Non-Medical): Not on file  Physical Activity:   . Days of Exercise per Week: Not on file  . Minutes of Exercise per Session: Not on file  Stress:   . Feeling of Stress : Not on file  Social Connections:   . Frequency of Communication with Friends and Family: Not on file  . Frequency of Social Gatherings with Friends and Family: Not on file  . Attends Religious Services: Not on file  . Active Member of Clubs or Organizations: Not on file  . Attends Archivist Meetings: Not on file  . Marital Status: Not on file  Intimate Partner Violence:   . Fear of Current or Ex-Partner: Not on file  . Emotionally Abused: Not on file  . Physically Abused: Not on file  . Sexually Abused: Not on file     PHYSICAL EXAM: Vitals:   09/09/20 0846  BP: (!) 165/76  Pulse: 68  SpO2: 96%   General: No acute distress Head:   Normocephalic/atraumatic Skin/Extremities: No rash, no edema Neurological Exam: Mental status: alert and oriented to person, place, and time, no dysarthria or aphasia, Fund of knowledge is appropriate.  Recent and remote memory  are impaired.  Attention and concentration are reduced.  Able to name objects and repeat phrases. Crosby score 19/30 Montreal Cognitive Assessment  09/09/2020  Visuospatial/ Executive (0/5) 4  Naming (0/3) 2  Attention: Read list of digits (0/2) 1  Attention: Read list of letters (0/1) 0  Attention: Serial 7 subtraction starting at 100 (0/3) 0  Language: Repeat phrase (0/2) 2  Language : Fluency (0/1) 1  Abstraction (0/2) 2  Delayed Recall (0/5) 0  Orientation (0/6) 6  Total 18  Adjusted Score (based on education) 19    Cranial nerves: CN I: not tested CN II: pupils equal, round and reactive to light, visual fields intact CN III, IV, VI:  full range of motion, no nystagmus, no ptosis CN V: facial sensation intact CN VII: upper and lower face symmetric CN VIII: hearing intact to conversation CN IX, X: gag intact, uvula midline CN XI: sternocleidomastoid and trapezius muscles intact CN XII: tongue midline Bulk & Tone: normal, no fasciculations, +bilateral cogwheeling. Motor: 5/5 throughout with no pronator drift. Sensation: intact to light touch, cold, pin, vibration sense.  No extinction to double simultaneous stimulation.  Romberg test negative Deep Tendon Reflexes: +1 throughout Cerebellar: no incoordination on finger to nose testing Gait: slow and cautious with cane due to right knee issues. Tremor: none noted during exam, she had some tremors with writing Good finger and foot taps. No postural instability.   IMPRESSION: This is an 86 year old right-handed woman with a history of hyperlipidemia, migraines, COPD, cervical cancer, depression, anxiety, presenting for evaluation of visual hallucinations. On further questioning, she started having memory  changes several years ago, worse in the past 2-3 years. She started having intermittent visual hallucinations this past year. Her neurological exam is non-focal, no significant parkinsonian signs. Harlem score 19/30. MRI brain shows diffuse atrophy, chronic microvascular disease. Symptoms suggestive of Alzheimer's disease with behavioral disturbance. Findings discussed with patient and her daughter. At this time, we agreed that the risks/side effects of dementia medications outweigh potential benefits. She has not had any hallucinations in 3 months. Family advised to closely monitor medication compliance, water intake. Discussed eventual need for more help at home. She does not drive. Follow-up in 6-8 months, they know to call for any changes.   Thank you for allowing me to participate in the care of this patient. Please do not hesitate to call for any questions or concerns.   Ellouise Newer, M.D.  CC: Dr. Dema Severin, Marilynne Drivers, [place]

## 2020-09-19 ENCOUNTER — Encounter: Payer: Self-pay | Admitting: Orthopaedic Surgery

## 2020-09-19 ENCOUNTER — Telehealth: Payer: Self-pay | Admitting: Neurology

## 2020-09-19 ENCOUNTER — Ambulatory Visit (INDEPENDENT_AMBULATORY_CARE_PROVIDER_SITE_OTHER): Payer: Medicare Other | Admitting: Orthopaedic Surgery

## 2020-09-19 ENCOUNTER — Other Ambulatory Visit: Payer: Self-pay

## 2020-09-19 ENCOUNTER — Ambulatory Visit: Payer: Self-pay

## 2020-09-19 DIAGNOSIS — M25561 Pain in right knee: Secondary | ICD-10-CM

## 2020-09-19 DIAGNOSIS — G8929 Other chronic pain: Secondary | ICD-10-CM

## 2020-09-19 MED ORDER — METHYLPREDNISOLONE ACETATE 40 MG/ML IJ SUSP
40.0000 mg | INTRAMUSCULAR | Status: AC | PRN
  Administered 2020-09-19: 40 mg via INTRA_ARTICULAR

## 2020-09-19 MED ORDER — LIDOCAINE HCL 1 % IJ SOLN
3.0000 mL | INTRAMUSCULAR | Status: AC | PRN
  Administered 2020-09-19: 3 mL

## 2020-09-19 NOTE — Telephone Encounter (Signed)
Referring providers office needs record of new patient visit.

## 2020-09-19 NOTE — Progress Notes (Signed)
Office Visit Note   Patient: Amy Kaiser           Date of Birth: Oct 03, 1929           MRN: [id number] Visit Date: 09/19/2020              Requested by: Harlan Stains, MD [address] [place],  [place] [address] PCP: Harlan Stains, MD   Assessment & Plan: Visit Diagnoses:  1. Chronic pain of right knee     Plan: I did talk to her in length about her knee.  I felt it was worthwhile trying a steroid injection in her right knee today to see if this will calm down her symptoms of pain.  I explained the risk and benefits of steroid injections.  We will also try hinged knee brace to just support the knee.  Like to see her back in 2 weeks.  This is a knee that I would definitely MRI if she continues to have the pain she is having.  All questions and concerns were answered and addressed.  Follow-Up Instructions: Return in about 2 weeks (around 10/03/2020).   Orders:  Orders Placed This Encounter  Procedures  . Large Joint Inj  . XR KNEE 3 VIEW RIGHT   No orders of the defined types were placed in this encounter.     Procedures: Large Joint Inj: R knee on 09/19/2020 10:28 AM Indications: diagnostic evaluation and pain Details: 22 G 1.5 in needle, superolateral approach  Arthrogram: No  Medications: 3 mL lidocaine 1 %; 40 mg methylPREDNISolone acetate 40 MG/ML Outcome: tolerated well, no immediate complications Procedure, treatment alternatives, risks and benefits explained, specific risks discussed. Consent was given by the patient. Immediately prior to procedure a time out was called to verify the correct patient, procedure, equipment, support staff and site/side marked as required. Patient was prepped and draped in the usual sterile fashion.       Clinical Data: No additional findings.   Subjective: Chief Complaint  Patient presents with  . Right Ankle - Pain  The patient is an 86 year old female who comes in for evaluation treatment of right knee  pain.  She injured this knee about a year ago when she had a twisting type of accident.  She says the knee feels like it is coming give out.  She does not use a cane when she walks.  She does not like sheets touching the medial side of her right knee.  She is never had surgery on that knee and has not had any type of injection.  The knee does swell.  This is been going on for over a year now.  She does take Tylenol as needed for pain.  She feels like this is getting worse.  Family is also with her today.  She is not a diabetic.  She denies any other acute change in her medical status.  She is very active.  HPI  Review of Systems There is currently listed headache, chest pain, shortness of breath, fever, chills, nausea, vomiting  Objective: Vital Signs: There were no vitals taken for this visit.  Physical Exam She is alert and oriented x3 and in no acute distress Ortho Exam Examination of her right knee does show medial joint line tenderness and painful arc of motion of that knee.  There is a mild effusion.  The knee is ligamentously stable. Specialty Comments:  No specialty comments available.  Imaging: XR KNEE 3 VIEW RIGHT  Result Date: 09/19/2020 2 views of the right knee show no acute findings.  The joint space is very well-maintained.  The bone is osteopenic.  There is mild patellofemoral arthritic changes.    PMFS History: Patient Active Problem List   Diagnosis Date Noted  . Pelvis fracture ([facility]) 02/01/2019  . Pelvic fracture ([facility]) 02/01/2019  . Essential hypertension 10/06/2018  . Chronic diastolic CHF (congestive heart failure) ([facility]) 10/06/2018  . S/P TAVR (transcatheter aortic valve replacement) 04/09/2017  . Coronary artery disease with exertional angina ([facility]) 03/11/2017  . Severe aortic stenosis 02/20/2017   Past Medical History:  Diagnosis Date  . Anemia   . Anxiety   . Arthritis    "just about all over" (03/11/2017)  . Asthma   . Cervical cancer ([facility])   . COPD  (chronic obstructive pulmonary disease) ([facility])   . Coronary artery disease   . Depression   . Heart murmur   . Hip fracture ([facility])    left hip  . Hyperlipidemia   . Low blood sugar   . Migraine    "work related; quit when I quit work in ~ 1997" (03/11/2017)  . Skin cancer    back    History reviewed. No pertinent family history.  Past Surgical History:  Procedure Laterality Date  . APPENDECTOMY    . CARDIAC CATHETERIZATION  02/2017  . CATARACT EXTRACTION W/ INTRAOCULAR LENS  IMPLANT, BILATERAL Bilateral   . CORONARY ANGIOPLASTY WITH STENT PLACEMENT  03/11/2017  . CORONARY STENT INTERVENTION N/A 03/11/2017   Procedure: Coronary Stent Intervention;  Surgeon: Sherren Mocha, MD;  Location: [facility];  Service: Cardiovascular;  Laterality: N/A;  . IR RADIOLOGY PERIPHERAL GUIDED IV START  02/26/2017  . IR US GUIDE VASC ACCESS RIGHT  02/26/2017  . RIGHT/LEFT HEART CATH AND CORONARY ANGIOGRAPHY N/A 02/20/2017   Procedure: Right/Left Heart Cath and Coronary Angiography;  Surgeon: Sherren Mocha, MD;  Location: [facility];  Service: Cardiovascular;  Laterality: N/A;  . SKIN CANCER EXCISION     "back"  . TEE WITHOUT CARDIOVERSION N/A 04/09/2017   Procedure: TRANSESOPHAGEAL ECHOCARDIOGRAM (TEE);  Surgeon: Sherren Mocha, MD;  Location: [facility];  Service: Open Heart Surgery;  Laterality: N/A;  . TRANSCATHETER AORTIC VALVE REPLACEMENT, TRANSFEMORAL N/A 04/09/2017   Procedure: TRANSCATHETER AORTIC VALVE REPLACEMENT, TRANSFEMORAL;  Surgeon: Sherren Mocha, MD;  Location: [facility];  Service: Open Heart Surgery;  Laterality: N/A;  . VAGINAL HYSTERECTOMY     Social History   Occupational History  . Not on file  Tobacco Use  . Smoking status: Never Smoker  . Smokeless tobacco: Never Used  Vaping Use  . Vaping Use: Never used  Substance and Sexual Activity  . Alcohol use: No  . Drug use: No  . Sexual activity: Not Currently

## 2020-10-03 ENCOUNTER — Ambulatory Visit: Payer: Medicare Other | Admitting: Orthopaedic Surgery

## 2020-10-03 ENCOUNTER — Encounter: Payer: Self-pay | Admitting: Orthopaedic Surgery

## 2020-10-03 DIAGNOSIS — M25561 Pain in right knee: Secondary | ICD-10-CM | POA: Diagnosis not present

## 2020-10-03 DIAGNOSIS — G8929 Other chronic pain: Secondary | ICD-10-CM

## 2020-10-03 NOTE — Progress Notes (Signed)
The patient comes in today for follow-up after having a steroid injection in her right knee just 2 weeks ago.  She says she can finally got good relief from the injection.  She is 86 years old.  She is also trying to slow down in terms of not walking as far she was walking.  She does ambulate using a cane.  Examination of her right knee today shows no effusion.  She does not have any significant pain today on my exam.  I have recommended Voltaren gel as needed for the knee.  If things flareup at all they know to let us know.  Her x-rays actually showed a well-maintained joint space with that knee.  All questions and concerns were answered and addressed.

## 2020-10-17 NOTE — Progress Notes (Signed)
Cardiology Office Note  Date:  10/18/2020   ID:  Amy Kaiser, DOB December 28, 1928, MRN [id number]  PCP:  Harlan Stains, MD   Chief Complaint  Patient presents with  . Follow-up    1 Year follow up. Medications verbally reviewed with patient and list she provided.     HPI:  Amy Kaiser is a 86 y.o. Female with PMH of  severe aortic stenosis with progressive symptoms of diastolic heart failure  TAVR with a 20 mm Sapien 3 valve via a percutaneous transfemoral approach 04/09/2017.   severe stenosis of the proximal RCA  stenting with a 4 mm drug-eluting stent 03/11/2017.    Hallucinations last month Seen by neurology memory changes several years ago, worse in the past 2-3 years MRI brain shows diffuse atrophy, chronic microvascular disease. Symptoms suggestive of Alzheimer's disease with behavioral disturbance per neuro  Carotid: Nonobstructive Reports echo was done: not available Reports it was done through [facility] imaging, no report or images available for review  Presents in wheelchair/scooter Walks with a cane  No chest pain or shortness of breath  Prior echocardiogram reviewed with her from 2019 moderately elevated right heart pressures, normal prosthetic aortic valve, moderate MR and ejection fraction 60%  We did receive lab work from primary care, total cholesterol 160, LDL 93 Creatinine 0.96 potassium 5.1 normal LFTs hemoglobin 11.9  EKG personally reviewed by myself on todays visit Shows normal sinus rhythm rate 58 bpm left axis deviation no other significant ST or T wave changes   PMH:   has a past medical history of Anemia, Anxiety, Arthritis, Asthma, Cervical cancer ([place]), COPD (chronic obstructive pulmonary disease) ([place]), Coronary artery disease, Depression, Heart murmur, Hip fracture ([place]), Hyperlipidemia, Low blood sugar, Migraine, and Skin cancer.  PSH:    Past Surgical History:  Procedure Laterality Date  . APPENDECTOMY    . CARDIAC  CATHETERIZATION  02/2017  . CATARACT EXTRACTION W/ INTRAOCULAR LENS  IMPLANT, BILATERAL Bilateral   . CORONARY ANGIOPLASTY WITH STENT PLACEMENT  03/11/2017  . CORONARY STENT INTERVENTION N/A 03/11/2017   Procedure: Coronary Stent Intervention;  Surgeon: Sherren Mocha, MD;  Location: [place] [facility];  Service: Cardiovascular;  Laterality: N/A;  . IR RADIOLOGY PERIPHERAL GUIDED IV START  02/26/2017  . IR US GUIDE VASC ACCESS RIGHT  02/26/2017  . RIGHT/LEFT HEART CATH AND CORONARY ANGIOGRAPHY N/A 02/20/2017   Procedure: Right/Left Heart Cath and Coronary Angiography;  Surgeon: Sherren Mocha, MD;  Location: [place] [facility];  Service: Cardiovascular;  Laterality: N/A;  . SKIN CANCER EXCISION     "back"  . TEE WITHOUT CARDIOVERSION N/A 04/09/2017   Procedure: TRANSESOPHAGEAL ECHOCARDIOGRAM (TEE);  Surgeon: Sherren Mocha, MD;  Location: [place];  Service: Open Heart Surgery;  Laterality: N/A;  . TRANSCATHETER AORTIC VALVE REPLACEMENT, TRANSFEMORAL N/A 04/09/2017   Procedure: TRANSCATHETER AORTIC VALVE REPLACEMENT, TRANSFEMORAL;  Surgeon: Sherren Mocha, MD;  Location: [place];  Service: Open Heart Surgery;  Laterality: N/A;  . VAGINAL HYSTERECTOMY      Current Outpatient Medications  Medication Sig Dispense Refill  . aspirin EC 81 MG tablet Take 81 mg by mouth daily.    . calcium-vitamin D (OSCAL WITH D) 500-200 MG-UNIT tablet Take 1 tablet by mouth 2 (two) times daily.    . Cholecalciferol (VITAMIN D3) 1000 units CAPS Take 1,000 Units by mouth daily with lunch.     . metoprolol tartrate (LOPRESSOR) 25 MG tablet Take 1/2 tablet (12.5 mg) by mouth twice a day 90 tablet 3  .  Multiple Vitamins-Minerals (ICAPS AREDS 2 PO) Take 1 capsule by mouth 2 (two) times daily.    . nitroGLYCERIN (NITROSTAT) 0.4 MG SL tablet DISSOLVE ONE TABLET UNDER THE TONGUE EVERY 5 MINUTES AS NEEDED FOR CHEST PAIN.  DO NOT EXCEED A TOTAL OF 3 DOSES IN 15 MINUTES NOW 25 tablet 0  . rosuvastatin (CRESTOR) 20 MG tablet Take 20 mg  by mouth daily.    . vitamin B-12 (CYANOCOBALAMIN) 500 MCG tablet Take 1 tablet (500 mcg total) by mouth daily. 90 tablet 0   No current facility-administered medications for this visit.     Allergies:   Latex, Lisinopril, Penicillins, Pravachol [pravastatin sodium], Alendronate sodium, Codeine, Morphine and related, Prozac [fluoxetine hcl], Symbicort [budesonide-formoterol fumarate], and Wellbutrin [bupropion]   Social History:  The patient  reports that she has never smoked. She has never used smokeless tobacco. She reports that she does not drink alcohol and does not use drugs.   Family History:   family history is not on file.    Review of Systems: Review of Systems  Constitutional: Negative.   Respiratory: Negative.   Cardiovascular: Negative.   Gastrointestinal: Negative.   Musculoskeletal: Negative.   Neurological: Negative.   Psychiatric/Behavioral: Negative.   All other systems reviewed and are negative.   PHYSICAL EXAM: VS:  BP 140/64 (BP Location: Right Arm, Patient Position: Sitting, Cuff Size: Normal)   Pulse (!) 58   Ht 5' 6.5" (1.689 m)   Wt 171 lb (77.6 kg)   SpO2 95%   BMI 27.19 kg/m  , BMI Body mass index is 27.19 kg/m. Constitutional:  oriented to person, place, and time. No distress.  Presenting in a wheelchair HENT:  Head: Grossly normal Eyes:  no discharge. No scleral icterus.  Neck: No JVD, no carotid bruits  Cardiovascular: Regular rate and rhythm, no murmurs appreciated Pulmonary/Chest: Clear to auscultation bilaterally, no wheezes or rails Abdominal: Soft.  no distension.  no tenderness.  Musculoskeletal: Normal range of motion Neurological:  normal muscle tone. Coordination normal. No atrophy Skin: Skin warm and dry Psychiatric: normal affect, pleasant  Recent Labs: No results found for requested labs within last 8760 hours.    Lipid Panel No results found for: CHOL, HDL, LDLCALC, TRIG    Wt Readings from Last 3 Encounters:  10/18/20  171 lb (77.6 kg)  09/09/20 174 lb 9.6 oz (79.2 kg)  10/13/19 175 lb 8 oz (79.6 kg)      ASSESSMENT AND PLAN:  Chronic diastolic CHF (congestive heart failure) (HCC) Euvolemic Echo by family was done in [facility], report has been requested  Coronary artery disease with exertional angina ([place]) - Plan: EKG 12-Lead Currently with no symptoms of angina. No further workup at this time. Continue current medication regimen. Stable  Severe aortic stenosis - Plan: EKG 12-Lead TAVR Reports having echo somewhere We will request a copy from [facility] imaging per the family  S/P TAVR (transcatheter aortic valve replacement) Trying to request echo done at outside facility  Essential hypertension Blood pressure is well controlled on today's visit. No changes made to the medications.   Total encounter time more than 25 minutes Greater than 50% was spent in counseling and coordination of care with the patient    No orders of the defined types were placed in this encounter.    Signed, Esmond Plants, M.D., Ph.D. 10/18/2020  [place], [place]

## 2020-10-18 ENCOUNTER — Other Ambulatory Visit: Payer: Self-pay

## 2020-10-18 ENCOUNTER — Encounter: Payer: Self-pay | Admitting: Cardiovascular Disease

## 2020-10-18 ENCOUNTER — Ambulatory Visit: Payer: Medicare Other | Admitting: Cardiovascular Disease

## 2020-10-18 VITALS — BP 140/64 | HR 58 | Ht 66.5 in | Wt 171.0 lb

## 2020-10-18 DIAGNOSIS — I25118 Atherosclerotic heart disease of native coronary artery with other forms of angina pectoris: Secondary | ICD-10-CM | POA: Diagnosis not present

## 2020-10-18 DIAGNOSIS — I350 Nonrheumatic aortic (valve) stenosis: Secondary | ICD-10-CM | POA: Diagnosis not present

## 2020-10-18 DIAGNOSIS — Z952 Presence of prosthetic heart valve: Secondary | ICD-10-CM

## 2020-10-18 DIAGNOSIS — I10 Essential (primary) hypertension: Secondary | ICD-10-CM

## 2020-10-18 DIAGNOSIS — Z23 Encounter for immunization: Secondary | ICD-10-CM

## 2020-10-18 DIAGNOSIS — I5032 Chronic diastolic (congestive) heart failure: Secondary | ICD-10-CM | POA: Diagnosis not present

## 2020-10-18 NOTE — Patient Instructions (Addendum)
We will request records from [facility] imaging,  For your recent echo on 09/10/2020 You received the flu vaccine today in clinic    Medication Instructions:  No changes  If you need a refill on your cardiac medications before your next appointment, please call your pharmacy.    Lab work: No new labs needed   If you have labs (blood work) drawn today and your tests are completely normal, you will receive your results only by: Marland Kitchen MyChart Message (if you have MyChart) OR . A paper copy in the mail If you have any lab test that is abnormal or we need to change your treatment, we will call you to review the results.   Testing/Procedures: No new testing needed   Follow-Up: At [facility], you and your health needs are our priority.  As part of our continuing mission to provide you with exceptional heart care, we have created designated Provider Care Teams.  These Care Teams include your primary Cardiologist (physician) and Advanced Practice Providers (APPs -  Physician Assistants and Nurse Practitioners) who all work together to provide you with the care  you need, when you need it.  . You will need a follow up appointment in 12 months  . Providers on your designated Care Team:   . Murray Hodgkins, NP . Christell Faith, PA-C . Marrianne Mood, PA-C  Any Other Special Instructions Will Be Listed Below (If Applicable).  COVID-19 Vaccine Information can be found at: ShippingScam.co.uk For questions related to vaccine distribution or appointments, please email [email or web address] or call [phone].

## 2020-10-21 ENCOUNTER — Other Ambulatory Visit: Payer: Self-pay

## 2020-10-21 DIAGNOSIS — I359 Nonrheumatic aortic valve disorder, unspecified: Secondary | ICD-10-CM

## 2020-10-21 NOTE — Progress Notes (Signed)
Order placed for an echo, pt was mistaken at last visit, 2 years ago had a [place] of her cardioids, not echo. Scheduling is arranging this for pt.

## 2020-11-02 DIAGNOSIS — N3000 Acute cystitis without hematuria: Secondary | ICD-10-CM | POA: Diagnosis not present

## 2020-11-14 ENCOUNTER — Encounter: Payer: Self-pay | Admitting: Physician Assistant

## 2020-11-14 ENCOUNTER — Other Ambulatory Visit: Payer: Self-pay | Admitting: Physician Assistant

## 2020-11-14 ENCOUNTER — Ambulatory Visit: Payer: Medicare Other | Admitting: Physician Assistant

## 2020-11-14 DIAGNOSIS — J45901 Unspecified asthma with (acute) exacerbation: Secondary | ICD-10-CM | POA: Insufficient documentation

## 2020-11-14 DIAGNOSIS — F325 Major depressive disorder, single episode, in full remission: Secondary | ICD-10-CM | POA: Insufficient documentation

## 2020-11-14 DIAGNOSIS — R443 Hallucinations, unspecified: Secondary | ICD-10-CM | POA: Insufficient documentation

## 2020-11-14 DIAGNOSIS — M25561 Pain in right knee: Secondary | ICD-10-CM

## 2020-11-14 DIAGNOSIS — E538 Deficiency of other specified B group vitamins: Secondary | ICD-10-CM | POA: Insufficient documentation

## 2020-11-14 DIAGNOSIS — G8929 Other chronic pain: Secondary | ICD-10-CM | POA: Diagnosis not present

## 2020-11-14 DIAGNOSIS — F4321 Adjustment disorder with depressed mood: Secondary | ICD-10-CM | POA: Insufficient documentation

## 2020-11-14 DIAGNOSIS — E559 Vitamin D deficiency, unspecified: Secondary | ICD-10-CM | POA: Insufficient documentation

## 2020-11-14 DIAGNOSIS — M810 Age-related osteoporosis without current pathological fracture: Secondary | ICD-10-CM | POA: Insufficient documentation

## 2020-11-14 DIAGNOSIS — F324 Major depressive disorder, single episode, in partial remission: Secondary | ICD-10-CM | POA: Insufficient documentation

## 2020-11-14 DIAGNOSIS — G4700 Insomnia, unspecified: Secondary | ICD-10-CM | POA: Insufficient documentation

## 2020-11-14 DIAGNOSIS — G459 Transient cerebral ischemic attack, unspecified: Secondary | ICD-10-CM | POA: Insufficient documentation

## 2020-11-14 DIAGNOSIS — D509 Iron deficiency anemia, unspecified: Secondary | ICD-10-CM | POA: Insufficient documentation

## 2020-11-14 DIAGNOSIS — J4530 Mild persistent asthma, uncomplicated: Secondary | ICD-10-CM | POA: Insufficient documentation

## 2020-11-14 MED ORDER — DIAZEPAM 2 MG PO TABS: ORAL_TABLET | ORAL | 0 refills | Status: DC

## 2020-11-14 NOTE — Progress Notes (Signed)
HPI: Amy Kaiser returns today after having a right knee injection with Dr. Ninfa Linden on 09/19/2020.  She states she still having pain but the injection with cortisone only gave her few weeks of pain relief.  Having to use a cane to get up and down.  She is having no mechanical symptoms of the knee.  No acute injury to the knee does report over a year ago that she was getting up from gardening and twisted the knee though.  She notes that no swelling or redness about the knee. Radiographs of her knee showed a well-preserved right total knee arthroplasty which were personally reviewed today by myself.  Physical exam: Right knee she has full range of motion some slight patellofemoral crepitus.  She has tenderness over the medial joint line no instability valgus varus stressing McMurray's is negative.  No abnormal warmth erythema or effusion right knee.  Impression: Right knee pain  Plan: Given patient's radiographs with no significant arthritic changes and her continued pain recommend MRI to evaluate for meniscal tear also to evaluate the cartilage.  MRI as needed to determine what treatment she would best be served by being supplemental injection versus knee arthroscopy.  She is claustrophobic and will require an open MRI.  We will give her some Valium to take prior to the MRI.  Follow-up after the MRI to go over results discuss further treatment.  Questions encouraged and answered at length.  Quad strengthening exercises were reviewed with the patient today.

## 2020-11-14 NOTE — Addendum Note (Signed)
Addended by: Robyne Peers on: 11/14/2020 02:48 PM   Modules accepted: Orders

## 2020-11-15 ENCOUNTER — Telehealth: Payer: Self-pay | Admitting: Physician Assistant

## 2020-11-15 NOTE — Telephone Encounter (Signed)
That is fine 

## 2020-11-15 NOTE — Telephone Encounter (Signed)
FYI

## 2020-11-15 NOTE — Telephone Encounter (Signed)
Patient's daughter called. Would like Artis Delay to know that patient has a heart valve replacement.

## 2020-11-22 ENCOUNTER — Other Ambulatory Visit: Payer: Medicare Other

## 2020-12-03 ENCOUNTER — Other Ambulatory Visit: Payer: Medicare Other

## 2020-12-06 ENCOUNTER — Other Ambulatory Visit: Payer: Medicare Other

## 2020-12-07 ENCOUNTER — Ambulatory Visit: Payer: Medicare Other | Admitting: Orthopaedic Surgery

## 2020-12-19 ENCOUNTER — Other Ambulatory Visit: Payer: Self-pay

## 2020-12-19 ENCOUNTER — Ambulatory Visit
Admission: RE | Admit: 2020-12-19 | Discharge: 2020-12-19 | Disposition: A | Discharge status: Home / Self Care | Payer: Medicare Other | Source: Ambulatory Visit | Attending: Physician Assistant | Admitting: Physician Assistant

## 2020-12-19 DIAGNOSIS — M25561 Pain in right knee: Secondary | ICD-10-CM

## 2020-12-19 DIAGNOSIS — G8929 Other chronic pain: Secondary | ICD-10-CM

## 2020-12-20 ENCOUNTER — Ambulatory Visit (INDEPENDENT_AMBULATORY_CARE_PROVIDER_SITE_OTHER): Payer: Medicare Other

## 2020-12-20 DIAGNOSIS — I359 Nonrheumatic aortic valve disorder, unspecified: Secondary | ICD-10-CM | POA: Diagnosis not present

## 2020-12-20 LAB — ECHOCARDIOGRAM COMPLETE
AR max vel: 1.67 cm2
AV Area VTI: 1.85 cm2
AV Area mean vel: 1.86 cm2
AV Mean grad: 13 mmHg
AV Peak grad: 21.9 mmHg
Ao pk vel: 2.34 m/s
Area-P 1/2: 2.95 cm2
Calc EF: 60 %
S' Lateral: 2.5 cm
Single Plane A2C EF: 58.2 %
Single Plane A4C EF: 59.4 %

## 2020-12-22 ENCOUNTER — Encounter: Payer: Self-pay | Admitting: Orthopaedic Surgery

## 2020-12-22 ENCOUNTER — Ambulatory Visit: Payer: Medicare Other | Admitting: Orthopaedic Surgery

## 2020-12-22 DIAGNOSIS — M23321 Other meniscus derangements, posterior horn of medial meniscus, right knee: Secondary | ICD-10-CM

## 2020-12-22 DIAGNOSIS — M25561 Pain in right knee: Secondary | ICD-10-CM

## 2020-12-22 DIAGNOSIS — S83271D Complex tear of lateral meniscus, current injury, right knee, subsequent encounter: Secondary | ICD-10-CM | POA: Diagnosis not present

## 2020-12-22 DIAGNOSIS — G8929 Other chronic pain: Secondary | ICD-10-CM

## 2020-12-22 DIAGNOSIS — S83271A Complex tear of lateral meniscus, current injury, right knee, initial encounter: Secondary | ICD-10-CM | POA: Insufficient documentation

## 2020-12-22 NOTE — Progress Notes (Signed)
The patient is following up after having a MRI of her right knee due to chronic knee pain.  We have provided a steroid injection in that knee and it helps some.  She does have complex cardiac issues and has had I believe valve type of surgery.  She only takes 81 mg aspirin twice a day.  She does ambulate with a cane.  She is 87 years old.  Examination of her right knee shows some varus malalignment with mild effusion.  She has good range of motion of that knee but global tenderness.  There are positive McMurray signs to the medial lateral compartments and medial lateral joint line tenderness.  Surprisingly the MRI shows only minimal cartilage degeneration in the knee.  She does have complex medial and lateral meniscal tears which are horizontal in nature.  I showed the patient her MRI findings and a knee model and explained in detail what is involved with her knee.  She is interested in at least trying arthroscopic surgery for her knee which would be less invasive and I agree with this given the failed conservative treatment and a high level of activities.  She is still having significant knee pain with some locking catching.  She does need cardiac clearance for this procedure.  She would not have to stop her twice daily aspirin from my standpoint.  Once we have cardiac clearance we can schedule her for a right knee arthroscopy with partial medial and lateral meniscectomies.  We would then see her back in 1 week postoperative.

## 2020-12-23 ENCOUNTER — Telehealth: Payer: Self-pay

## 2020-12-23 MED ORDER — FUROSEMIDE 20 MG PO TABS: 20.0000 mg | ORAL_TABLET | ORAL | 3 refills | Status: DC

## 2020-12-23 NOTE — Telephone Encounter (Signed)
Able to reach [name] regarding Amy Kaiser's  recent echo, Dr. Rockey Situ had a chance to review her results and advised  "Echocardiogram, would discuss with family as well as patient  Stable prosthetic aortic valve, normal ejection fraction  Of note is moderate to severe mitral valve regurgitation with moderately elevated right heart pressures  Would consider starting Lasix 20 mg 3 days a week Monday Wednesday Friday"  Amy Kaiser is agreeable to starting lasix 20 mg MWF. Will send this in to her local pharmacy, Amy Kaiser is glad for the call with pt's results, otherwise all questions or concerns were address and no additional concerns at this time. Agreeable to plan, will call back for anything further.

## 2020-12-28 ENCOUNTER — Telehealth: Payer: Self-pay | Admitting: Cardiovascular Disease

## 2020-12-28 NOTE — Telephone Encounter (Signed)
   [facility] Pre-operative Risk Assessment    HEARTCARE STAFF: - Please ensure there is not already an duplicate clearance open for this procedure. - Under Visit Info/Reason for Call, type in Other and utilize the format Clearance MM/DD/YY or Clearance TBD. Do not use dashes or single digits. - If request is for dental extraction, please clarify the # of teeth to be extracted.  Request for surgical clearance:  1. What type of surgery is being performed? R knee arthoscopy  2. When is this surgery scheduled? TBD  3. What type of clearance is required (medical clearance vs. Pharmacy clearance to hold med vs. Both)? Not noted  4. Are there any medications that need to be held prior to surgery and how long? Not noted  5. Practice name and name of physician performing surgery? [facility]   6. What is the office phone number? [phone]   7.   What is the office fax number? [phone]  8.   Anesthesia type (None, local, MAC, general) ? general   Amy Kaiser 12/28/2020, 4:53 PM  _________________________________________________________________   (provider comments below)

## 2020-12-31 NOTE — Telephone Encounter (Signed)
   Primary Cardiologist: Ida Rogue, MD  Chart reviewed as part of pre-operative protocol coverage. Because of Amy Kaiser's past medical history and time since last visit, she will require a follow-up visit in order to better assess preoperative cardiovascular risk.  Pre-op covering staff: - Please schedule appointment and call patient to inform them. If patient already had an upcoming appointment within acceptable timeframe, please add "pre-op clearance" to the appointment notes so provider is aware. - Please contact requesting surgeon's office via preferred method (i.e, phone, fax) to inform them of need for appointment prior to surgery.  If applicable, this message will also be routed to pharmacy pool and/or primary cardiologist for input on holding anticoagulant/antiplatelet agent as requested below so that this information is available to the clearing provider at time of patient's appointment.   [place], [place]  12/31/2020, 4:09 PM

## 2021-01-02 NOTE — Telephone Encounter (Signed)
Tried to reach pt today to discuss about scheduling pre op appt with Dr. Rockey Situ or APP. No answer or vm came on.

## 2021-01-02 NOTE — Telephone Encounter (Signed)
Pt has been scheduled to see Laurann Montana, PA 01/03/21 for pre op clearance. I will forward notes to PA for upcoming appt.

## 2021-01-02 NOTE — Telephone Encounter (Signed)
Patient has been scheduled for pre op appt for 3/1 with Laurann Montana

## 2021-01-03 ENCOUNTER — Encounter: Payer: Self-pay | Admitting: Family

## 2021-01-03 ENCOUNTER — Ambulatory Visit: Payer: Medicare Other | Admitting: Family

## 2021-01-03 ENCOUNTER — Other Ambulatory Visit: Payer: Self-pay

## 2021-01-03 VITALS — BP 130/60 | HR 81 | Ht 67.0 in | Wt 172.0 lb

## 2021-01-03 DIAGNOSIS — Z952 Presence of prosthetic heart valve: Secondary | ICD-10-CM

## 2021-01-03 DIAGNOSIS — I350 Nonrheumatic aortic (valve) stenosis: Secondary | ICD-10-CM | POA: Diagnosis not present

## 2021-01-03 DIAGNOSIS — Z01810 Encounter for preprocedural cardiovascular examination: Secondary | ICD-10-CM | POA: Diagnosis not present

## 2021-01-03 DIAGNOSIS — I25118 Atherosclerotic heart disease of native coronary artery with other forms of angina pectoris: Secondary | ICD-10-CM | POA: Diagnosis not present

## 2021-01-03 DIAGNOSIS — I493 Ventricular premature depolarization: Secondary | ICD-10-CM

## 2021-01-03 DIAGNOSIS — I10 Essential (primary) hypertension: Secondary | ICD-10-CM

## 2021-01-03 DIAGNOSIS — I340 Nonrheumatic mitral (valve) insufficiency: Secondary | ICD-10-CM

## 2021-01-03 DIAGNOSIS — R300 Dysuria: Secondary | ICD-10-CM | POA: Diagnosis not present

## 2021-01-03 NOTE — Progress Notes (Signed)
Office Visit    Patient Name: Amy Kaiser Date of Encounter: 01/03/2021  PCP:  Harlan Stains, MD   [place] [place]  Cardiologist:  Ida Rogue, MD  Advanced Practice Provider:  No care team member to display Electrophysiologist:  None   Chief Complaint    Amy Kaiser is a 87 y.o. female with a hx of severe aortic stenosis s/p TAVR 04/09/2017, CAD with severe stenosis of the proximal RCA s/p DES 04/1442, chronic diastolic heart failure, hyperlipidemia, hypertension presents today for cardiac clearance  Past Medical History    Past Medical History:  Diagnosis Date  . Anemia   . Anxiety   . Arthritis    "just about all over" (03/11/2017)  . Asthma   . Cervical cancer ([place])   . COPD (chronic obstructive pulmonary disease) ([place])   . Coronary artery disease   . Depression   . Heart murmur   . Hip fracture ([place])    left hip  . Hyperlipidemia   . Low blood sugar   . Migraine    "work related; quit when I quit work in ~ 1997" (03/11/2017)  . Skin cancer    back   Past Surgical History:  Procedure Laterality Date  . APPENDECTOMY    . CARDIAC CATHETERIZATION  02/2017  . CATARACT EXTRACTION W/ INTRAOCULAR LENS  IMPLANT, BILATERAL Bilateral   . CORONARY ANGIOPLASTY WITH STENT PLACEMENT  03/11/2017  . CORONARY STENT INTERVENTION N/A 03/11/2017   Procedure: Coronary Stent Intervention;  Surgeon: Sherren Mocha, MD;  Location: [facility];  Service: Cardiovascular;  Laterality: N/A;  . IR RADIOLOGY PERIPHERAL GUIDED IV START  02/26/2017  . IR US GUIDE VASC ACCESS RIGHT  02/26/2017  . RIGHT/LEFT HEART CATH AND CORONARY ANGIOGRAPHY N/A 02/20/2017   Procedure: Right/Left Heart Cath and Coronary Angiography;  Surgeon: Sherren Mocha, MD;  Location: [facility];  Service: Cardiovascular;  Laterality: N/A;  . SKIN CANCER EXCISION     "back"  . TEE WITHOUT CARDIOVERSION N/A 04/09/2017   Procedure: TRANSESOPHAGEAL ECHOCARDIOGRAM (TEE);  Surgeon:  Sherren Mocha, MD;  Location: [facility];  Service: Open Heart Surgery;  Laterality: N/A;  . TRANSCATHETER AORTIC VALVE REPLACEMENT, TRANSFEMORAL N/A 04/09/2017   Procedure: TRANSCATHETER AORTIC VALVE REPLACEMENT, TRANSFEMORAL;  Surgeon: Sherren Mocha, MD;  Location: [facility];  Service: Open Heart Surgery;  Laterality: N/A;  . VAGINAL HYSTERECTOMY      Allergies  Allergies  Allergen Reactions  . Latex Hives, Itching and Other (See Comments)    Caused blisters in her mouth  . Lisinopril Other (See Comments)    Weakness   . Penicillins Other (See Comments)    Intolerance to ALL "CILLINS" > UNSPECIFIED REACTIONS  Has patient had a PCN reaction causing immediate rash, facial/tongue/throat swelling, SOB or lightheadedness with hypotension: UNSPECIFIED REACTION  Has patient had a PCN reaction causing severe rash involving mucus membranes or skin necrosis: UNSPECIFIED REACTION  Has patient had a PCN reaction that required hospitalization UNSPECIFIED REACTION  Has patient had a PCN reaction occurring within the last 10 years: UNSPECIFIED REACTION   . Pravachol [Pravastatin Sodium] Other (See Comments)    MYALGIAs LEG PAIN   . Alendronate Sodium Other (See Comments)    UNSPECIFIED REACTION  [Patient denies this allergy]   . Other Other (See Comments)  . Sulfamethoxazole-Trimethoprim Other (See Comments)  . Codeine Nausea And Vomiting  . Morphine And Related Nausea And Vomiting  . Prozac [Fluoxetine Hcl] Anxiety    "  FEELS SHAKY"   . Symbicort [Budesonide-Formoterol Fumarate] Anxiety    "SHAKY"   . Wellbutrin [Bupropion] Anxiety    "JITTERY"     History of Present Illness    Amy Kaiser is a 87 y.o. female with a hx of severe aortic stenosis s/p TAVR 04/09/2017, CAD with severe stenosis of the proximal RCA s/p DES 11/5398, chronic diastolic heart failure, hyperlipidemia, hypertension last seen 10/18/2020.   At last clinic visit she was noted to be doing overall well.  She was  recommended for updated echocardiogram which was performed 12/20/2020.  It showed LVEF 65 to 70%, no wall motion abnormalities, grade 2 diastolic dysfunction, RV normal size and function, moderately elevated PASP, moderate to severe MR with no stenosis, mild to moderate TR, stable prosthetic aortic valve.  As echocardiogram noted moderate to severe MR with moderately elevated right heart pressures.  She was recommended to start Lasix 20 mg 3 days a week.  Received cardiac clearance request for right knee arthroscopy with Ortho.  .  She presents today for follow-up with her friend.  Denies chest pain, pressure, tightness.  Reports no shortness of breath at rest no dyspnea on exertion.  We reviewed that her Lasix was started due to a moderate to severe mitral regurgitation and she was very appreciative of the explanation.  She remains very active working in her yard and walking.  EKGs/Labs/Other Studies Reviewed:   The following studies were reviewed today:  Echo 12/20/2020  1. Left ventricular ejection fraction, by estimation, is 65 to 70%. The  left ventricle has normal function. The left ventricle has no regional  wall motion abnormalities. Left ventricular diastolic parameters are  consistent with Grade II diastolic  dysfunction (pseudonormalization).   2. Right ventricular systolic function is normal. The right ventricular  size is normal. There is moderately elevated pulmonary artery systolic  pressure.   3. The mitral valve is degenerative. Moderate to severe mitral valve  regurgitation. No evidence of mitral stenosis.   4. Tricuspid valve regurgitation is mild to moderate.   5. The aortic valve has been repaired/replaced. Aortic valve  regurgitation is not visualized. Mild aortic valve stenosis. There is a 20  mm Sapien prosthetic (TAVR) valve present in the aortic position.  Procedure Date: 06/05/201/8.   6. The inferior vena cava is normal in size with greater than 50%   respiratory variability, suggesting right atrial pressure of 3 mmHg.   EKG:  EKG is  ordered today.  The ekg ordered today demonstrates SR 81 bpm with left axis deviation, occasional PVC, and moderate voltage criteria for LVH - no acute ST/T wave changes - stable compared to previous.   Recent Labs: No results found for requested labs within last 8760 hours.  Recent Lipid Panel No results found for: CHOL, TRIG, HDL, CHOLHDL, VLDL, LDLCALC, LDLDIRECT   Home Medications   Current Meds  Medication Sig  . aspirin EC 81 MG tablet Take 81 mg by mouth daily.  . calcium-vitamin D (OSCAL WITH D) 500-200 MG-UNIT tablet Take 1 tablet by mouth 2 (two) times daily.  . Cholecalciferol (VITAMIN D3) 1000 units CAPS Take 1,000 Units by mouth daily with lunch.   . furosemide (LASIX) 20 MG tablet Take 1 tablet (20 mg total) by mouth every Monday, Wednesday, and Friday.  . metoprolol tartrate (LOPRESSOR) 25 MG tablet Take 1/2 tablet (12.5 mg) by mouth twice a day  . Multiple Vitamins-Minerals (ICAPS AREDS 2 PO) Take 1 capsule by mouth 2 (two) times  daily.  . nitroGLYCERIN (NITROSTAT) 0.4 MG SL tablet DISSOLVE ONE TABLET UNDER THE TONGUE EVERY 5 MINUTES AS NEEDED FOR CHEST PAIN.  DO NOT EXCEED A TOTAL OF 3 DOSES IN 15 MINUTES NOW  . rosuvastatin (CRESTOR) 20 MG tablet Take 20 mg by mouth daily.  . vitamin B-12 (CYANOCOBALAMIN) 500 MCG tablet Take 1 tablet (500 mcg total) by mouth daily.     Review of Systems   Review of Systems  Constitutional: Negative for chills, fever and malaise/fatigue.  Cardiovascular: Negative for chest pain, dyspnea on exertion, leg swelling, near-syncope, orthopnea, palpitations and syncope.  Respiratory: Negative for cough, shortness of breath and wheezing.   Gastrointestinal: Negative for nausea and vomiting.  Neurological: Negative for dizziness, light-headedness and weakness.   All other systems reviewed and are otherwise negative except as noted above.  Physical Exam     VS:  BP 130/60 (BP Location: Right Arm, Patient Position: Sitting, Cuff Size: Normal)   Pulse 81   Ht 5\' 7"  (1.702 m)   Wt 172 lb (78 kg)   SpO2 97%   BMI 26.94 kg/m  , BMI Body mass index is 26.94 kg/m.  Wt Readings from Last 3 Encounters:  01/03/21 172 lb (78 kg)  10/18/20 171 lb (77.6 kg)  09/09/20 174 lb 9.6 oz (79.2 kg)    GEN: Well nourished, well developed, in no acute distress. HEENT: normal. Neck: Supple, no JVD  or masses. Cardiac: RRR, no murmurs, rubs, or gallops. No clubbing, cyanosis, edema.  Radials/DP/PT 2+ and equal bilaterally.  Respiratory:  Respirations regular and unlabored, clear to auscultation bilaterally. GI: Soft, nontender, nondistended. MS: No deformity or atrophy. Skin: Warm and dry, no rash. Neuro:  Strength and sensation are intact. Psych: Normal affect.  Assessment & Plan    1. Preoperative cardiovascular clearance - Plan for right knee arthroscopy with [facility]. According to the Revised Cardiac Risk Index (RCRI), her Perioperative Risk of Major Cardiac Event is (%): 11. Her Functional Capacity in METs is: 4.18 according to the Duke Activity Status Index (DASI).  Per AHA/ACC guidelines she is acceptable risk for the planned procedure without further cardiovascular testing. Per discussion with her primary cardiologist, Dr. Rockey Situ, would prefer she remain on Aspirin throughout the perioperative period due to history of TAVR and coronary stenting. However, if deemed necessity by operating surgeon, permissible to hold Aspirin as needed. She was made aware of this recommendation. I will route this note to the requesting office via epic fax function.  2. Severe AS s/p TAVR 2018-Echo 12/20/20 with stable prosthetic valve function. Recommend periodic echocardiogram for monitoring.   3. Chronic diastolic heart failure / Mitral regurgitation - Euvolemic and well compensated on exam.  NYHA I. Continue Furosemide 20mg  three times per week. Continue low salt  diet and regular cardiovascular exercise.   4. CAD- Her GDMT includes aspirin, metoprolol, rosuvastatin, PRN nitroglycerin.  No anginal symptoms, no indication for ischemic evaluation.  5. HLD- Continue rosuvastatin 20 mg daily.  6. PVC - Occasional PVC noted on EKG today.  She reports an occasional flutter but this is not bothersome and not associate with pain or shortness of breath.  No indication for further evaluation.  Discussed the PVCs are most often benign and she will let us know if they are occurring more frequently at which time we could consider ZIO monitor.  7. HTN- BP well controlled. Continue current antihypertensive regimen.   Disposition: Follow up December 2022 with Dr. Rockey Situ or APP  Signed, Martie Lee  Gilford Rile, NP 01/03/2021, 9:54 AM [facility]

## 2021-01-03 NOTE — Patient Instructions (Addendum)
Medication Instructions:  No medication changes today.   *If you need a refill on your cardiac medications before your next appointment, please call your pharmacy*   Lab Work: None ordered today.   Testing/Procedures: Your EKG today showed normal sinus rhythm which is a good result. It did show an occasional PVC which is an early heart beat that is not dangerous but can feel like a flutter or a skipped beat.  Follow-Up: At [facility], you and your health needs are our priority.  As part of our continuing mission to provide you with exceptional heart care, we have created designated Provider Care Teams.  These Care Teams include your primary Cardiologist (physician) and Advanced Practice Providers (APPs -  Physician Assistants and Nurse Practitioners) who all work together to provide you with the care you need, when you need it.  We recommend signing up for the patient portal called "MyChart".  Sign up information is provided on this After Visit Summary.  MyChart is used to connect with patients for Virtual Visits (Telemedicine).  Patients are able to view lab/test results, encounter notes, upcoming appointments, etc.  Non-urgent messages can be sent to your provider as well.   To learn more about what you can do with MyChart, go to [email or web address].    Your next appointment:   December 2022  The format for your next appointment:   In Person  Provider:   You may see Ida Rogue, MD or one of the following Advanced Practice Providers on your designated Care Team:    Murray Hodgkins, NP  Christell Faith, PA-C  Marrianne Mood, PA-C  Cadence Lindrith, [place]  Laurann Montana, NP  Other Instructions  We will send a note to [facility] that you are cleared for your surgery.

## 2021-01-03 NOTE — Telephone Encounter (Signed)
Clearance provided in office visit 01/03/21. See separate encounter. That office note was faxed to requesting party via [place] fax function.   Loel Dubonnet, NP

## 2021-01-11 DIAGNOSIS — R350 Frequency of micturition: Secondary | ICD-10-CM | POA: Diagnosis not present

## 2021-01-24 DIAGNOSIS — N390 Urinary tract infection, site not specified: Secondary | ICD-10-CM | POA: Diagnosis not present

## 2021-01-24 DIAGNOSIS — R350 Frequency of micturition: Secondary | ICD-10-CM | POA: Diagnosis not present

## 2021-02-21 ENCOUNTER — Telehealth: Payer: Self-pay

## 2021-02-28 ENCOUNTER — Other Ambulatory Visit: Payer: Self-pay | Admitting: Physician Assistant

## 2021-03-02 ENCOUNTER — Other Ambulatory Visit: Payer: Self-pay

## 2021-03-02 ENCOUNTER — Encounter (HOSPITAL_BASED_OUTPATIENT_CLINIC_OR_DEPARTMENT_OTHER): Payer: Self-pay | Admitting: Orthopaedic Surgery

## 2021-03-02 NOTE — Progress Notes (Signed)
Chart reviewed with Dr. Sabra Heck. Ok to proceed as scheduled.

## 2021-03-06 ENCOUNTER — Encounter (HOSPITAL_BASED_OUTPATIENT_CLINIC_OR_DEPARTMENT_OTHER)
Admission: RE | Admit: 2021-03-06 | Discharge: 2021-03-06 | Disposition: A | Discharge status: Home / Self Care | Payer: Medicare Other | Source: Ambulatory Visit | Attending: Orthopaedic Surgery | Admitting: Orthopaedic Surgery

## 2021-03-06 ENCOUNTER — Other Ambulatory Visit (HOSPITAL_COMMUNITY)
Admission: RE | Admit: 2021-03-06 | Discharge: 2021-03-06 | Disposition: A | Discharge status: Home / Self Care | Payer: Medicare Other | Source: Ambulatory Visit | Attending: Orthopaedic Surgery | Admitting: Orthopaedic Surgery

## 2021-03-06 DIAGNOSIS — Z20822 Contact with and (suspected) exposure to covid-19: Secondary | ICD-10-CM | POA: Diagnosis not present

## 2021-03-06 DIAGNOSIS — Z01812 Encounter for preprocedural laboratory examination: Secondary | ICD-10-CM | POA: Insufficient documentation

## 2021-03-06 LAB — BASIC METABOLIC PANEL
Anion gap: 6 (ref 5–15)
BUN: 22 mg/dL (ref 8–23)
CO2: 26 mmol/L (ref 22–32)
Calcium: 8.7 mg/dL — ABNORMAL LOW (ref 8.9–10.3)
Chloride: 106 mmol/L (ref 98–111)
Creatinine, Ser: 0.95 mg/dL (ref 0.44–1.00)
GFR, Estimated: 57 mL/min — ABNORMAL LOW (ref >60)
Glucose, Bld: 95 mg/dL (ref 70–99)
Potassium: 4.9 mmol/L (ref 3.5–5.1)
Sodium: 138 mmol/L (ref 135–145)

## 2021-03-06 NOTE — Progress Notes (Signed)

## 2021-03-07 LAB — SARS CORONAVIRUS 2 (TAT 6-24 HRS): SARS Coronavirus 2: NEGATIVE

## 2021-03-09 ENCOUNTER — Ambulatory Visit (HOSPITAL_BASED_OUTPATIENT_CLINIC_OR_DEPARTMENT_OTHER)
Admission: RE | Admit: 2021-03-09 | Discharge: 2021-03-09 | Disposition: A | Discharge status: Home / Self Care | Payer: Medicare Other | Attending: Orthopaedic Surgery | Admitting: Orthopaedic Surgery

## 2021-03-09 ENCOUNTER — Telehealth: Payer: Self-pay | Admitting: Orthopaedic Surgery

## 2021-03-09 ENCOUNTER — Ambulatory Visit (HOSPITAL_BASED_OUTPATIENT_CLINIC_OR_DEPARTMENT_OTHER): Payer: Medicare Other | Admitting: Anesthesiology

## 2021-03-09 ENCOUNTER — Encounter (HOSPITAL_BASED_OUTPATIENT_CLINIC_OR_DEPARTMENT_OTHER)
Admission: RE | Disposition: A | Discharge status: Home / Self Care | Payer: Self-pay | Source: Home / Self Care | Attending: Orthopaedic Surgery

## 2021-03-09 ENCOUNTER — Other Ambulatory Visit: Payer: Self-pay

## 2021-03-09 ENCOUNTER — Other Ambulatory Visit: Payer: Self-pay | Admitting: Orthopaedic Surgery

## 2021-03-09 ENCOUNTER — Encounter (HOSPITAL_BASED_OUTPATIENT_CLINIC_OR_DEPARTMENT_OTHER): Payer: Self-pay | Admitting: Orthopaedic Surgery

## 2021-03-09 DIAGNOSIS — Z955 Presence of coronary angioplasty implant and graft: Secondary | ICD-10-CM | POA: Diagnosis not present

## 2021-03-09 DIAGNOSIS — Z880 Allergy status to penicillin: Secondary | ICD-10-CM | POA: Insufficient documentation

## 2021-03-09 DIAGNOSIS — Z85828 Personal history of other malignant neoplasm of skin: Secondary | ICD-10-CM | POA: Diagnosis not present

## 2021-03-09 DIAGNOSIS — Z9049 Acquired absence of other specified parts of digestive tract: Secondary | ICD-10-CM | POA: Insufficient documentation

## 2021-03-09 DIAGNOSIS — Z7982 Long term (current) use of aspirin: Secondary | ICD-10-CM | POA: Diagnosis not present

## 2021-03-09 DIAGNOSIS — X58XXXA Exposure to other specified factors, initial encounter: Secondary | ICD-10-CM | POA: Insufficient documentation

## 2021-03-09 DIAGNOSIS — Z882 Allergy status to sulfonamides status: Secondary | ICD-10-CM | POA: Diagnosis not present

## 2021-03-09 DIAGNOSIS — I2510 Atherosclerotic heart disease of native coronary artery without angina pectoris: Secondary | ICD-10-CM | POA: Insufficient documentation

## 2021-03-09 DIAGNOSIS — S83271D Complex tear of lateral meniscus, current injury, right knee, subsequent encounter: Secondary | ICD-10-CM | POA: Diagnosis not present

## 2021-03-09 DIAGNOSIS — S83231A Complex tear of medial meniscus, current injury, right knee, initial encounter: Secondary | ICD-10-CM | POA: Insufficient documentation

## 2021-03-09 DIAGNOSIS — Z91040 Latex allergy status: Secondary | ICD-10-CM | POA: Insufficient documentation

## 2021-03-09 DIAGNOSIS — M23321 Other meniscus derangements, posterior horn of medial meniscus, right knee: Secondary | ICD-10-CM | POA: Diagnosis present

## 2021-03-09 DIAGNOSIS — S83281A Other tear of lateral meniscus, current injury, right knee, initial encounter: Secondary | ICD-10-CM | POA: Diagnosis not present

## 2021-03-09 DIAGNOSIS — S83271A Complex tear of lateral meniscus, current injury, right knee, initial encounter: Secondary | ICD-10-CM | POA: Diagnosis present

## 2021-03-09 DIAGNOSIS — S83241A Other tear of medial meniscus, current injury, right knee, initial encounter: Secondary | ICD-10-CM | POA: Diagnosis not present

## 2021-03-09 DIAGNOSIS — E785 Hyperlipidemia, unspecified: Secondary | ICD-10-CM | POA: Insufficient documentation

## 2021-03-09 DIAGNOSIS — Z8541 Personal history of malignant neoplasm of cervix uteri: Secondary | ICD-10-CM | POA: Insufficient documentation

## 2021-03-09 DIAGNOSIS — Z888 Allergy status to other drugs, medicaments and biological substances status: Secondary | ICD-10-CM | POA: Diagnosis not present

## 2021-03-09 DIAGNOSIS — E559 Vitamin D deficiency, unspecified: Secondary | ICD-10-CM | POA: Diagnosis not present

## 2021-03-09 DIAGNOSIS — Z90710 Acquired absence of both cervix and uterus: Secondary | ICD-10-CM | POA: Insufficient documentation

## 2021-03-09 DIAGNOSIS — Z885 Allergy status to narcotic agent status: Secondary | ICD-10-CM | POA: Diagnosis not present

## 2021-03-09 DIAGNOSIS — Z79899 Other long term (current) drug therapy: Secondary | ICD-10-CM | POA: Diagnosis not present

## 2021-03-09 DIAGNOSIS — Z881 Allergy status to other antibiotic agents status: Secondary | ICD-10-CM | POA: Insufficient documentation

## 2021-03-09 DIAGNOSIS — D509 Iron deficiency anemia, unspecified: Secondary | ICD-10-CM | POA: Diagnosis not present

## 2021-03-09 DIAGNOSIS — Z952 Presence of prosthetic heart valve: Secondary | ICD-10-CM | POA: Insufficient documentation

## 2021-03-09 HISTORY — DX: Other specified postprocedural states: R11.2

## 2021-03-09 HISTORY — DX: Urinary tract infection, site not specified: N39.0

## 2021-03-09 HISTORY — PX: KNEE ARTHROSCOPY: SHX127

## 2021-03-09 HISTORY — DX: Other specified postprocedural states: Z98.890

## 2021-03-09 SURGERY — ARTHROSCOPY, KNEE
Anesthesia: General | Site: Knee | Laterality: Right

## 2021-03-09 MED ORDER — LACTATED RINGERS IV SOLN: INTRAVENOUS | Status: DC

## 2021-03-09 MED ORDER — FENTANYL CITRATE (PF) 100 MCG/2ML IJ SOLN
INTRAMUSCULAR | Status: AC
  Filled 2021-03-09: qty 2

## 2021-03-09 MED ORDER — MIDAZOLAM HCL 2 MG/2ML IJ SOLN
INTRAMUSCULAR | Status: AC
  Filled 2021-03-09: qty 2

## 2021-03-09 MED ORDER — OXYCODONE HCL 5 MG PO TABS: 5.0000 mg | ORAL_TABLET | Freq: Once | ORAL | Status: DC | PRN

## 2021-03-09 MED ORDER — ONDANSETRON HCL 4 MG/2ML IJ SOLN
INTRAMUSCULAR | Status: AC
  Filled 2021-03-09: qty 2

## 2021-03-09 MED ORDER — EPHEDRINE SULFATE 50 MG/ML IJ SOLN
INTRAMUSCULAR | Status: DC | PRN
  Administered 2021-03-09: 10 mg via INTRAVENOUS

## 2021-03-09 MED ORDER — TRAMADOL HCL 50 MG PO TABS: 50.0000 mg | ORAL_TABLET | Freq: Four times a day (QID) | ORAL | 0 refills | Status: DC | PRN

## 2021-03-09 MED ORDER — LIDOCAINE HCL (CARDIAC) PF 100 MG/5ML IV SOSY
PREFILLED_SYRINGE | INTRAVENOUS | Status: DC | PRN
  Administered 2021-03-09: 60 mg via INTRAVENOUS

## 2021-03-09 MED ORDER — CLINDAMYCIN PHOSPHATE 900 MG/50ML IV SOLN
INTRAVENOUS | Status: AC
  Filled 2021-03-09: qty 50

## 2021-03-09 MED ORDER — DEXAMETHASONE SODIUM PHOSPHATE 10 MG/ML IJ SOLN
INTRAMUSCULAR | Status: DC | PRN
  Administered 2021-03-09: 5 mg via INTRAVENOUS

## 2021-03-09 MED ORDER — MORPHINE SULFATE (PF) 4 MG/ML IV SOLN
INTRAVENOUS | Status: AC
  Filled 2021-03-09: qty 1

## 2021-03-09 MED ORDER — LIDOCAINE 2% (20 MG/ML) 5 ML SYRINGE
INTRAMUSCULAR | Status: AC
  Filled 2021-03-09: qty 5

## 2021-03-09 MED ORDER — PROMETHAZINE HCL 25 MG/ML IJ SOLN: 6.2500 mg | INTRAMUSCULAR | Status: DC | PRN

## 2021-03-09 MED ORDER — PHENYLEPHRINE 40 MCG/ML (10ML) SYRINGE FOR IV PUSH (FOR BLOOD PRESSURE SUPPORT)
PREFILLED_SYRINGE | INTRAVENOUS | Status: AC
  Filled 2021-03-09: qty 10

## 2021-03-09 MED ORDER — CLINDAMYCIN PHOSPHATE 900 MG/50ML IV SOLN
900.0000 mg | INTRAVENOUS | Status: AC
  Administered 2021-03-09: 900 mg via INTRAVENOUS

## 2021-03-09 MED ORDER — PROPOFOL 10 MG/ML IV BOLUS
INTRAVENOUS | Status: AC
  Filled 2021-03-09: qty 20

## 2021-03-09 MED ORDER — ONDANSETRON HCL 4 MG PO TABS: 4.0000 mg | ORAL_TABLET | Freq: Three times a day (TID) | ORAL | 0 refills | Status: DC | PRN

## 2021-03-09 MED ORDER — DROPERIDOL 2.5 MG/ML IJ SOLN
INTRAMUSCULAR | Status: AC
  Filled 2021-03-09: qty 2

## 2021-03-09 MED ORDER — OXYCODONE HCL 5 MG/5ML PO SOLN: 5.0000 mg | Freq: Once | ORAL | Status: DC | PRN

## 2021-03-09 MED ORDER — HYDROMORPHONE HCL 1 MG/ML IJ SOLN: 0.2500 mg | INTRAMUSCULAR | Status: DC | PRN

## 2021-03-09 MED ORDER — DEXAMETHASONE SODIUM PHOSPHATE 10 MG/ML IJ SOLN
INTRAMUSCULAR | Status: AC
  Filled 2021-03-09: qty 1

## 2021-03-09 MED ORDER — ONDANSETRON HCL 4 MG/2ML IJ SOLN
INTRAMUSCULAR | Status: DC | PRN
  Administered 2021-03-09: 4 mg via INTRAVENOUS

## 2021-03-09 MED ORDER — BUPIVACAINE HCL (PF) 0.25 % IJ SOLN
INTRAMUSCULAR | Status: AC
  Filled 2021-03-09: qty 30

## 2021-03-09 MED ORDER — FENTANYL CITRATE (PF) 100 MCG/2ML IJ SOLN
INTRAMUSCULAR | Status: DC | PRN
  Administered 2021-03-09: 50 ug via INTRAVENOUS

## 2021-03-09 MED ORDER — PROPOFOL 10 MG/ML IV BOLUS
INTRAVENOUS | Status: DC | PRN
  Administered 2021-03-09: 150 mg via INTRAVENOUS

## 2021-03-09 MED ORDER — EPHEDRINE 5 MG/ML INJ
INTRAVENOUS | Status: AC
  Filled 2021-03-09: qty 10

## 2021-03-09 MED ORDER — BUPIVACAINE HCL (PF) 0.25 % IJ SOLN
INTRAMUSCULAR | Status: DC | PRN
  Administered 2021-03-09: 20 mL

## 2021-03-09 MED ORDER — DROPERIDOL 2.5 MG/ML IJ SOLN
INTRAMUSCULAR | Status: DC | PRN
  Administered 2021-03-09: .625 mg via INTRAVENOUS

## 2021-03-09 MED ORDER — SUCCINYLCHOLINE CHLORIDE 200 MG/10ML IV SOSY
PREFILLED_SYRINGE | INTRAVENOUS | Status: AC
  Filled 2021-03-09: qty 10

## 2021-03-09 SURGICAL SUPPLY — 35 items
BLADE EXCALIBUR 4.0X13 (MISCELLANEOUS) IMPLANT
BNDG ELASTIC 6X5.8 VLCR STR LF (GAUZE/BANDAGES/DRESSINGS) ×2 IMPLANT
COVER WAND RF STERILE (DRAPES) IMPLANT
DISSECTOR  3.8MM X 13CM (MISCELLANEOUS)
DISSECTOR 3.8MM X 13CM (MISCELLANEOUS) IMPLANT
DRAPE ARTHROSCOPY W/POUCH 90 (DRAPES) ×2 IMPLANT
DRAPE U-SHAPE 47X51 STRL (DRAPES) ×2 IMPLANT
DRSG PAD ABDOMINAL 8X10 ST (GAUZE/BANDAGES/DRESSINGS) ×2 IMPLANT
DURAPREP 26ML APPLICATOR (WOUND CARE) ×2 IMPLANT
ELECT MENISCUS 165MM 90D (ELECTRODE) IMPLANT
ELECT REM PT RETURN 9FT ADLT (ELECTROSURGICAL)
ELECTRODE REM PT RTRN 9FT ADLT (ELECTROSURGICAL) IMPLANT
EXCALIBUR 3.8MM X 13CM (MISCELLANEOUS) ×1 IMPLANT
GAUZE SPONGE 4X4 12PLY STRL (GAUZE/BANDAGES/DRESSINGS) ×2 IMPLANT
GAUZE XEROFORM 1X8 LF (GAUZE/BANDAGES/DRESSINGS) ×2 IMPLANT
GLOVE SRG 8 PF TXTR STRL LF DI (GLOVE) ×2 IMPLANT
GLOVE SURG ORTHO LTX SZ7.5 (GLOVE) ×1 IMPLANT
GLOVE SURG ORTHO LTX SZ8 (GLOVE) ×1 IMPLANT
GLOVE SURG POLYISO LF SZ6.5 (GLOVE) ×2 IMPLANT
GLOVE SURG POLYISO LF SZ8 (GLOVE) ×2 IMPLANT
GLOVE SURG UNDER POLY LF SZ8 (GLOVE) ×4
GOWN STRL REUS W/ TWL LRG LVL3 (GOWN DISPOSABLE) ×2 IMPLANT
GOWN STRL REUS W/ TWL XL LVL3 (GOWN DISPOSABLE) ×1 IMPLANT
GOWN STRL REUS W/TWL LRG LVL3 (GOWN DISPOSABLE) ×4
GOWN STRL REUS W/TWL XL LVL3 (GOWN DISPOSABLE) ×2
MANIFOLD NEPTUNE II (INSTRUMENTS) IMPLANT
PACK ARTHROSCOPY DSU (CUSTOM PROCEDURE TRAY) ×2 IMPLANT
PACK BASIN DAY SURGERY FS (CUSTOM PROCEDURE TRAY) ×2 IMPLANT
PADDING CAST COTTON 6X4 STRL (CAST SUPPLIES) ×2 IMPLANT
PENCIL SMOKE EVACUATOR (MISCELLANEOUS) IMPLANT
SUT ETHILON 3 0 PS 1 (SUTURE) ×2 IMPLANT
TOWEL GREEN STERILE FF (TOWEL DISPOSABLE) ×2 IMPLANT
TUBING ARTHROSCOPY IRRIG 16FT (MISCELLANEOUS) ×2 IMPLANT
WATER STERILE IRR 1000ML POUR (IV SOLUTION) ×2 IMPLANT
WRAP KNEE MAXI GEL POST OP (GAUZE/BANDAGES/DRESSINGS) ×2 IMPLANT

## 2021-03-09 NOTE — Anesthesia Preprocedure Evaluation (Signed)
Anesthesia Evaluation  Patient identified by MRN, date of birth, ID band Patient awake    Reviewed: Allergy & Precautions, NPO status , Patient's Chart, lab work & pertinent test results  History of Anesthesia Complications (+) PONV  Airway Mallampati: II  TM Distance: >3 FB Neck ROM: Full    Dental  (+) Edentulous Upper, Partial Lower   Pulmonary asthma , COPD,    breath sounds clear to auscultation       Cardiovascular hypertension, Pt. on medications + angina + CAD and +CHF  + Valvular Problems/Murmurs  Rhythm:Regular Rate:Normal + Systolic murmurs    Neuro/Psych  Headaches, Anxiety Depression    GI/Hepatic   Endo/Other  diabetes  Renal/GU      Musculoskeletal  (+) Arthritis , Osteoarthritis,    Abdominal   Peds  Hematology   Anesthesia Other Findings   Reproductive/Obstetrics                             Anesthesia Physical  Anesthesia Plan  ASA: III  Anesthesia Plan: General   Post-op Pain Management:    Induction: Intravenous  PONV Risk Score and Plan: 4 or greater and Ondansetron, Treatment may vary due to age, Dexamethasone, Midazolam and Droperidol  Airway Management Planned: LMA  Additional Equipment: None  Intra-op Plan:   Post-operative Plan: Extubation in OR  Informed Consent: I have reviewed the patients History and Physical, chart, labs and discussed the procedure including the risks, benefits and alternatives for the proposed anesthesia with the patient or authorized representative who has indicated his/her understanding and acceptance.     Dental advisory given  Plan Discussed with: CRNA and Anesthesiologist  Anesthesia Plan Comments:         Anesthesia Quick Evaluation

## 2021-03-09 NOTE — Brief Op Note (Signed)
03/09/2021  8:20 AM  PATIENT:  Amy Kaiser  87 y.o. female  PRE-OPERATIVE DIAGNOSIS:  right knee medial and lateral meniscal tears  POST-OPERATIVE DIAGNOSIS:  right knee medial and lateral meniscal tears  PROCEDURE:  Procedure(s): RIGHT KNEE ARTHROSCOPY WITH PARTIAL MEDIAL AND LATERAL MENISCECTOMIES (Right)  SURGEON:  Surgeon(s) and Role:    [name], MD - Primary  ANESTHESIA:   local and general  EBL:  5 mL   COUNTS:  YES  DICTATION: .Other Dictation: Dictation Number [id number]  PLAN OF CARE: Discharge to home after PACU  PATIENT DISPOSITION:  PACU - hemodynamically stable.   Delay start of Pharmacological VTE agent (>24hrs) due to surgical blood loss or risk of bleeding: no

## 2021-03-09 NOTE — Telephone Encounter (Signed)
Pharmacy is closed and will open at 2

## 2021-03-09 NOTE — Telephone Encounter (Signed)
Per dr. Ninfa Linden call and change to 1 every 6 hours. Called pharmacy and made change

## 2021-03-09 NOTE — Discharge Instructions (Signed)
You may increase your activities as comfort allows. You may put all of your weight on your right knee as comfort allows. Do expect right knee swelling -ice and elevation intermittently throughout the day and the next few days as needed. In the next 24 to 48 hours you may remove all your dressings and shower.  You may get your incisions wet daily in the shower. After each shower you may place small Band-Aids over the incisions/sutures to protect them each.   Call your surgeon if you experience:   1.  Fever over 101.0. 2.  Inability to urinate. 3.  Nausea and/or vomiting. 4.  Extreme swelling or bruising at the surgical site. 5.  Continued bleeding from the incision. 6.  Increased pain, redness or drainage from the incision. 7.  Problems related to your pain medication. 8.  Any problems and/or concerns    Post Anesthesia Home Care Instructions  Activity: Get plenty of rest for the remainder of the day. A responsible individual must stay with you for 24 hours following the procedure.  For the next 24 hours, DO NOT: -Drive a car -Paediatric nurse -Drink alcoholic beverages -Take any medication unless instructed by your physician -Make any legal decisions or sign important papers.  Meals: Start with liquid foods such as gelatin or soup. Progress to regular foods as tolerated. Avoid greasy, spicy, heavy foods. If nausea and/or vomiting occur, drink only clear liquids until the nausea and/or vomiting subsides. Call your physician if vomiting continues.  Special Instructions/Symptoms: Your throat may feel dry or sore from the anesthesia or the breathing tube placed in your throat during surgery. If this causes discomfort, gargle with warm salt water. The discomfort should disappear within 24 hours.  If you had a scopolamine patch placed behind your ear for the management of post- operative nausea and/or vomiting:  1. The medication in the patch is effective for 72 hours, after which  it should be removed.  Wrap patch in a tissue and discard in the trash. Wash hands thoroughly with soap and water. 2. You may remove the patch earlier than 72 hours if you experience unpleasant side effects which may include dry mouth, dizziness or visual disturbances. 3. Avoid touching the patch. Wash your hands with soap and water after contact with the patch.

## 2021-03-09 NOTE — H&P (Signed)
Amy Kaiser is an 87 y.o. female.   Chief Complaint:   Right knee pain with swelling, locking and catching HPI: The patient is an 87 year old female with a well-documented and known meniscal tear involving her right knee lateral meniscus.  She has tried failed conservative treatment for this knee.  We have tried everything from activity modification and quad strengthening exercises and injections.  Eventually an MRI was obtained of the right knee and showed only minimal thinning of the cartilage in her knee but a complex lateral meniscal tear.  Given her continued symptoms she does wish to proceed with arthroscopic intervention for that knee.  Past Medical History:  Diagnosis Date  . Anemia   . Anxiety   . Arthritis    "just about all over" (03/11/2017)  . Asthma   . Cervical cancer ([place])   . Chronic UTI   . COPD (chronic obstructive pulmonary disease) ([place])   . Coronary artery disease   . Depression   . Heart murmur   . Hip fracture ([place])    left hip  . Hyperlipidemia   . Low blood sugar   . Migraine    "work related; quit when I quit work in ~ 1997" (03/11/2017)  . PONV (postoperative nausea and vomiting)   . Skin cancer    back    Past Surgical History:  Procedure Laterality Date  . APPENDECTOMY    . CARDIAC CATHETERIZATION  02/2017  . CATARACT EXTRACTION W/ INTRAOCULAR LENS  IMPLANT, BILATERAL Bilateral   . CORONARY ANGIOPLASTY WITH STENT PLACEMENT  03/11/2017  . CORONARY STENT INTERVENTION N/A 03/11/2017   Procedure: Coronary Stent Intervention;  Surgeon: Sherren Mocha, MD;  Location: [facility] [facility];  Service: Cardiovascular;  Laterality: N/A;  . IR RADIOLOGY PERIPHERAL GUIDED IV START  02/26/2017  . IR US GUIDE VASC ACCESS RIGHT  02/26/2017  . RIGHT/LEFT HEART CATH AND CORONARY ANGIOGRAPHY N/A 02/20/2017   Procedure: Right/Left Heart Cath and Coronary Angiography;  Surgeon: Sherren Mocha, MD;  Location: [place] [facility];  Service: Cardiovascular;  Laterality: N/A;   . SKIN CANCER EXCISION     "back"  . TEE WITHOUT CARDIOVERSION N/A 04/09/2017   Procedure: TRANSESOPHAGEAL ECHOCARDIOGRAM (TEE);  Surgeon: Sherren Mocha, MD;  Location: [facility];  Service: Open Heart Surgery;  Laterality: N/A;  . TRANSCATHETER AORTIC VALVE REPLACEMENT, TRANSFEMORAL N/A 04/09/2017   Procedure: TRANSCATHETER AORTIC VALVE REPLACEMENT, TRANSFEMORAL;  Surgeon: Sherren Mocha, MD;  Location: [place];  Service: Open Heart Surgery;  Laterality: N/A;  . VAGINAL HYSTERECTOMY      History reviewed. No pertinent family history. Social History:  reports that she has never smoked. She has never used smokeless tobacco. She reports that she does not drink alcohol and does not use drugs.  Allergies:  Allergies  Allergen Reactions  . Latex Hives, Itching and Other (See Comments)    Caused blisters in her mouth  . Lisinopril Other (See Comments)    Weakness   . Penicillins Other (See Comments)    Intolerance to ALL "CILLINS" > UNSPECIFIED REACTIONS  Has patient had a PCN reaction causing immediate rash, facial/tongue/throat swelling, SOB or lightheadedness with hypotension: UNSPECIFIED REACTION  Has patient had a PCN reaction causing severe rash involving mucus membranes or skin necrosis: UNSPECIFIED REACTION  Has patient had a PCN reaction that required hospitalization UNSPECIFIED REACTION  Has patient had a PCN reaction occurring within the last 10 years: UNSPECIFIED REACTION   . Pravachol [Pravastatin Sodium] Other (See Comments)  MYALGIAs LEG PAIN   . Alendronate Sodium Other (See Comments)    UNSPECIFIED REACTION  [Patient denies this allergy]   . Other Other (See Comments)  . Sulfamethoxazole-Trimethoprim Other (See Comments)    Pt denies   . Codeine Nausea And Vomiting  . Morphine And Related Nausea And Vomiting  . Prozac [Fluoxetine Hcl] Anxiety    "FEELS SHAKY"   . Symbicort [Budesonide-Formoterol Fumarate] Anxiety    "SHAKY"   . Wellbutrin [Bupropion] Anxiety     "JITTERY"     Medications Prior to Admission  Medication Sig Dispense Refill  . aspirin EC 81 MG tablet Take 81 mg by mouth daily.    . calcium-vitamin D (OSCAL WITH D) 500-200 MG-UNIT tablet Take 1 tablet by mouth 2 (two) times daily.    . Cholecalciferol (VITAMIN D3) 1000 units CAPS Take 1,000 Units by mouth daily with lunch.     . furosemide (LASIX) 20 MG tablet Take 1 tablet (20 mg total) by mouth every Monday, Wednesday, and Friday. 39 tablet 3  . metoprolol tartrate (LOPRESSOR) 25 MG tablet Take 1/2 tablet (12.5 mg) by mouth twice a day 90 tablet 3  . nitrofurantoin (MACRODANTIN) 100 MG capsule Take 100 mg by mouth daily.    . rosuvastatin (CRESTOR) 20 MG tablet Take 20 mg by mouth daily.    . vitamin B-12 (CYANOCOBALAMIN) 500 MCG tablet Take 1 tablet (500 mcg total) by mouth daily. 90 tablet 0  . Multiple Vitamins-Minerals (ICAPS AREDS 2 PO) Take 1 capsule by mouth 2 (two) times daily.    . nitroGLYCERIN (NITROSTAT) 0.4 MG SL tablet DISSOLVE ONE TABLET UNDER THE TONGUE EVERY 5 MINUTES AS NEEDED FOR CHEST PAIN.  DO NOT EXCEED A TOTAL OF 3 DOSES IN 15 MINUTES NOW 25 tablet 0    No results found for this or any previous visit (from the past 48 hour(s)). No results found.  Review of Systems  Musculoskeletal: Positive for joint swelling.  All other systems reviewed and are negative.   Blood pressure (!) 189/66, pulse 73, temperature 98 F (36.7 C), temperature source Oral, resp. rate 18, height 5\' 6"  (1.676 m), weight 78.2 kg, SpO2 99 %. Physical Exam Vitals reviewed.  Constitutional:      Appearance: Normal appearance.  HENT:     Head: Normocephalic and atraumatic.  Eyes:     Extraocular Movements: Extraocular movements intact.     Pupils: Pupils are equal, round, and reactive to light.  Cardiovascular:     Rate and Rhythm: Normal rate.     Pulses: Normal pulses.  Pulmonary:     Effort: Pulmonary effort is normal.  Abdominal:     Palpations: Abdomen is soft.   Musculoskeletal:     Cervical back: Normal range of motion.     Right knee: Effusion present. Tenderness present over the lateral joint line. Abnormal meniscus.  Neurological:     Mental Status: She is alert and oriented to person, place, and time.  Psychiatric:        Behavior: Behavior normal.      Assessment/Plan Right knee lateral meniscal tear  The plan is to proceed to surgery for right knee arthroscopy with a partial lateral meniscectomy.  The risks and benefits of surgery been discussed in detail and informed consent is obtained.  The right knee has been marked.  [name], MD 03/09/2021, 7:21 AM

## 2021-03-09 NOTE — Anesthesia Procedure Notes (Signed)
Procedure Name: LMA Insertion Date/Time: 03/09/2021 7:40 AM Performed by: Willa Frater, CRNA Pre-anesthesia Checklist: Patient identified, Emergency Drugs available, Suction available and Patient being monitored Patient Re-evaluated:Patient Re-evaluated prior to induction Oxygen Delivery Method: Circle system utilized Preoxygenation: Pre-oxygenation with 100% oxygen Induction Type: IV induction Ventilation: Mask ventilation without difficulty LMA: LMA inserted LMA Size: 4.0 Number of attempts: 1 Airway Equipment and Method: Bite block Placement Confirmation: positive ETCO2 Tube secured with: Tape Dental Injury: Teeth and Oropharynx as per pre-operative assessment

## 2021-03-09 NOTE — Telephone Encounter (Signed)
[facility] on [address] called stating Dr.blackman called in her tramadol and her max dosage for her age is 3 and he called in 8 so what does he want the pharmacy to do? CB [phone]

## 2021-03-09 NOTE — Anesthesia Postprocedure Evaluation (Signed)
Anesthesia Post Note  Patient: KIRSTA PROBERT  Procedure(s) Performed: RIGHT KNEE ARTHROSCOPY WITH PARTIAL MEDIAL AND LATERAL MENISCECTOMIES (Right Knee)     Patient location during evaluation: PACU Anesthesia Type: General Level of consciousness: awake and alert Pain management: pain level controlled Vital Signs Assessment: post-procedure vital signs reviewed and stable Respiratory status: spontaneous breathing, nonlabored ventilation and respiratory function stable Cardiovascular status: blood pressure returned to baseline and stable Postop Assessment: no apparent nausea or vomiting Anesthetic complications: no   No complications documented.  Last Vitals:  Vitals:   03/09/21 0850 03/09/21 0900  BP:  134/70  Pulse: 68 72  Resp: 17 14  Temp:    SpO2: 100% 100%    Last Pain:  Vitals:   03/09/21 0929  TempSrc:   PainSc: 0-No pain                 Lynda Rainwater

## 2021-03-09 NOTE — Transfer of Care (Signed)
Immediate Anesthesia Transfer of Care Note  Patient: Amy Kaiser  Procedure(s) Performed: RIGHT KNEE ARTHROSCOPY WITH PARTIAL MEDIAL AND LATERAL MENISCECTOMIES (Right Knee)  Patient Location: PACU  Anesthesia Type:General  Level of Consciousness: awake, alert , oriented, drowsy and patient cooperative  Airway & Oxygen Therapy: Patient Spontanous Breathing and Patient connected to face mask oxygen  Post-op Assessment: Report given to RN and Post -op Vital signs reviewed and stable  Post vital signs: Reviewed and stable  Last Vitals:  Vitals Value Taken Time  BP 140/91 03/09/21 0825  Temp 36.6 C 03/09/21 0825  Pulse 75 03/09/21 0827  Resp 15 03/09/21 0827  SpO2 100 % 03/09/21 0827  Vitals shown include unvalidated device data.  Last Pain:  Vitals:   03/09/21 0655  TempSrc: Oral  PainSc: 2       Patients Stated Pain Goal: 4 (78/29/56 2130)  Complications: No complications documented.

## 2021-03-10 ENCOUNTER — Encounter (HOSPITAL_BASED_OUTPATIENT_CLINIC_OR_DEPARTMENT_OTHER): Payer: Self-pay | Admitting: Orthopaedic Surgery

## 2021-03-10 NOTE — Op Note (Signed)
NAME: Amy Kaiser, MURRAY MEDICAL RECORD NO: [id number] ACCOUNT NO: [id number] DATE OF BIRTH: Aug 02, 1929 FACILITY: [facility] LOCATION: [facility] PHYSICIAN: Lind Guest. Ninfa Linden, MD  Operative Report   DATE OF PROCEDURE: 03/09/2021  PREOPERATIVE DIAGNOSIS:  Right knee complex lateral meniscal tear.  POSTOPERATIVE DIAGNOSIS:  Right knee complex medial and lateral meniscal tear.  PROCEDURE PERFORMED:  Right knee arthroscopy with partial medial and lateral meniscectomies.  SURGEON:  Lind Guest. Ninfa Linden, MD, MD  ANESTHESIA:   1.  General. 2.  Local with 0.25% plain Marcaine.  BLOOD LOSS:  Minimal.  COMPLICATIONS:  None.  INDICATIONS:  The patient is a very active 87 year old female with debilitating right knee pain with locking and catching.  We had tried conservative treatment for a long period of time including quad strengthening exercises and activity modification.   We have tried steroid injections in her knee.  Her x-rays actually show well-maintained joint space.  She had had some recurrent effusions and we eventually obtained an MRI of the knee.  It did show a complex lateral meniscal tear and some minimal  tearing of the medial meniscus.  The cartilage was intact throughout the knee.  With that being said and her continued mechanical symptoms and the failed conservative treatment, she did wish to proceed with an arthroscopic intervention and this was  recommended as well given her high level of functional activities.  We did have a long and thorough discussion about the risks and benefits of this surgery and informed consent was obtained.  DESCRIPTION OF PROCEDURE:  After informed consent was obtained, appropriate right knee was marked.  She was brought to the operating room and placed supine on the operating table.  General anesthesia was then obtained.  Her right thigh, knee, leg, ankle  and foot were prepped and draped with DuraPrep and sterile drapes including a sterile  stockinette.  With the bed raised and a lateral leg post utilized,  the right operative knee was flexed off the side table.  A timeout was called to identify correct  patient, correct right knee.  I then made an anterolateral arthroscopy portal and inserted a cannula into the right knee.  We did not drain any significant effusion.  So, I placed a camera into the knee and went to the medial compartment.  We made an  anteromedial incision.  We probed the medial meniscus and did find a small horizontal tear from the meniscal root to the mid body.  The cartilage with just some mild thinning on the medial femoral condyle.  Using arthroscopic shaver and basket forceps  biters, we performed a partial medial meniscectomy and taking this back to a stable margin with leaving plenty of meniscal tissue.  The ACL was assessed and PCL assessed and the intercondylar area and found to be intact.  With the knee in a  figure-of-four position, the lateral meniscus was assessed and found to have a complex tear from the mid body to anterior horn.  There was a cleavage-type of horizontal tear.  Using arthroscopic shaver and biters, we were able to perform a partial  lateral meniscectomy as well.  The lateral cartilage in that compartment was intact.  Finally, we assessed the patellofemoral joint and found some mild cartilage thinning underneath the patella, but otherwise intact patellofemoral joint.  I did debride  some of inflamed Hoffa's fat pad.  I then allowed fluid to lavage through the knee and then drained all fluid from the knee and removed all instrumentation.  We closed the  portal sites with interrupted nylon suture.  We did place Marcaine into the portal  sites in the knee itself.  A well-padded sterile dressing was applied.  She was awakened, extubated, and taken to recovery room in stable condition with all final counts being correct.  No complications noted.     PAA D: 03/09/2021 8:19:26 am T: 03/10/2021  2:22:00 am  JOB: [id number]/ [id number]

## 2021-03-16 ENCOUNTER — Ambulatory Visit (INDEPENDENT_AMBULATORY_CARE_PROVIDER_SITE_OTHER): Payer: Medicare Other | Admitting: Orthopaedic Surgery

## 2021-03-16 ENCOUNTER — Encounter: Payer: Self-pay | Admitting: Orthopaedic Surgery

## 2021-03-16 DIAGNOSIS — Z9889 Other specified postprocedural states: Secondary | ICD-10-CM

## 2021-03-16 NOTE — Progress Notes (Signed)
The patient is following up 1 week after a right knee arthroscopy.  She is 87 years old and had symptomatic medial lateral meniscal tears of the right knee but intact cartilage.  After failed conservative treatment over a long period of time, she elected for arthroscopic surgery.  She says she is ambling a cane and said the knee is still really sore and swelling but she seems be doing well.  Her calf is soft on the right side.  I did remove the 2 sutures.  There is a moderate knee joint effusion but she does not want to have this drained.  This is limiting her flexion extension.  I did show her arthroscopy pictures.  The cartilage was intact and the medial lateral compartments with medial lateral meniscal tearing which we debrided and took back to stable margins.  She will continue to slowly increase her activities as comfort allows.  We will reevaluate her in 4 weeks determine whether or not we would recommend aspiration or steroid injection or even outpatient physical therapy.  All questions and concerns were answered and addressed.

## 2021-04-12 ENCOUNTER — Encounter: Payer: Self-pay | Admitting: Orthopaedic Surgery

## 2021-04-12 ENCOUNTER — Ambulatory Visit (INDEPENDENT_AMBULATORY_CARE_PROVIDER_SITE_OTHER): Payer: Medicare Other | Admitting: Orthopaedic Surgery

## 2021-04-12 DIAGNOSIS — Z9889 Other specified postprocedural states: Secondary | ICD-10-CM

## 2021-04-12 NOTE — Progress Notes (Signed)
HPI: [name] returns today follow-up status post right knee arthroscopy.  She underwent a right knee arthroscopy for medial and lateral meniscal tears on 03/09/2021.  She feels overall that she is improving.  She is having no mechanical symptoms.  She has had some swelling of her lower legs but this is also improving.  She is on a diuretic.  States that she is now getting around sometimes about the house without a cane.   Physical exam: Right knee port sites well-healed.  She has full extension full flexion knee.  Calf supple nontender.  Impression: Status post right knee arthroscopy  Plan: She will continue work on range of motion strengthening the knee.  Follow-up with [place] if she has any questions or concerns.  Questions were encouraged and answered at length today

## 2021-05-02 ENCOUNTER — Encounter: Payer: Self-pay | Admitting: Orthopaedic Surgery

## 2021-05-02 ENCOUNTER — Ambulatory Visit (INDEPENDENT_AMBULATORY_CARE_PROVIDER_SITE_OTHER): Payer: Medicare Other | Admitting: Orthopaedic Surgery

## 2021-05-02 DIAGNOSIS — Z9889 Other specified postprocedural states: Secondary | ICD-10-CM

## 2021-05-02 DIAGNOSIS — S83271D Complex tear of lateral meniscus, current injury, right knee, subsequent encounter: Secondary | ICD-10-CM

## 2021-05-02 MED ORDER — METHYLPREDNISOLONE ACETATE 40 MG/ML IJ SUSP
40.0000 mg | INTRAMUSCULAR | Status: AC | PRN
  Administered 2021-05-02: 40 mg via INTRA_ARTICULAR

## 2021-05-02 MED ORDER — LIDOCAINE HCL 1 % IJ SOLN
3.0000 mL | INTRAMUSCULAR | Status: AC | PRN
  Administered 2021-05-02: 3 mL

## 2021-05-02 NOTE — Progress Notes (Signed)
   Procedure Note  Patient: Amy Kaiser             Date of Birth: [birth year]           MRN: [id number]             Visit Date: 05/02/2021  Procedures: Visit Diagnoses:  1. Status post arthroscopy of right knee   2. Complex tear of lateral meniscus of right knee as current injury, subsequent encounter     Large Joint Inj: R knee on 05/02/2021 9:24 AM Indications: diagnostic evaluation and pain Details: 22 G 1.5 in needle, superolateral approach  Arthrogram: No  Medications: 3 mL lidocaine 1 %; 40 mg methylPREDNISolone acetate 40 MG/ML Outcome: tolerated well, no immediate complications Procedure, treatment alternatives, risks and benefits explained, specific risks discussed. Consent was given by the patient. Immediately prior to procedure a time out was called to verify the correct patient, procedure, equipment, support staff and site/side marked as required. Patient was prepped and draped in the usual sterile fashion.

## 2021-05-02 NOTE — Progress Notes (Signed)
The patient is a 87 year old female who we performed a right knee arthroscopy on in early May.  It is getting close to 2 months since that surgery.  She had a significant lateral meniscal tear.  She is ambulate with a cane.  She says she does have knee swelling with her right operative knee and she has pain that is waking her up at night and she can get good sleep.  Examination the right knee shows a mild to moderate effusion.  There is no redness.  She does have appropriate postoperative pain with that right knee.  I was able to aspirate about 30 cc of fluid off of her right knee and placed a steroid in the knee joint and this gave her good relief.  I have recommended Benadryl to help her sleep at night and if still advocated physical therapy or even a knee sleeve.  She states that she does not want to try these things yet.  With that being said, we will see her back in a month to see how she is doing overall.

## 2021-05-17 ENCOUNTER — Other Ambulatory Visit: Payer: Self-pay

## 2021-05-17 ENCOUNTER — Ambulatory Visit: Payer: Medicare Other | Admitting: Neurology

## 2021-05-17 ENCOUNTER — Encounter: Payer: Self-pay | Admitting: Neurology

## 2021-05-17 VITALS — BP 137/74 | HR 80 | Ht 66.0 in | Wt 167.0 lb

## 2021-05-17 DIAGNOSIS — F0281 Dementia in other diseases classified elsewhere with behavioral disturbance: Secondary | ICD-10-CM | POA: Diagnosis not present

## 2021-05-17 DIAGNOSIS — G301 Alzheimer's disease with late onset: Secondary | ICD-10-CM

## 2021-05-17 NOTE — Patient Instructions (Signed)
Good to see you! Continue all your medications. Continue close supervision. Follow-up as needed, call for any changes   FALL PRECAUTIONS: Be cautious when walking. Scan the area for obstacles that may increase the risk of trips and falls. When getting up in the mornings, sit up at the edge of the bed for a few minutes before getting out of bed. Consider elevating the bed at the head end to avoid drop of blood pressure when getting up. Walk always in a well-lit room (use night lights in the walls). Avoid area rugs or power cords from appliances in the middle of the walkways. Use a walker or a cane if necessary and consider physical therapy for balance exercise. Get your eyesight checked regularly.  HOME SAFETY: Consider the safety of the kitchen when operating appliances like stoves, microwave oven, and blender. Consider having supervision and share cooking responsibilities until no longer able to participate in those. Accidents with firearms and other hazards in the house should be identified and addressed as well.   ABILITY TO BE LEFT ALONE: If patient is unable to contact 911 operator, consider using LifeLine, or when the need is there, arrange for someone to stay with patients. Smoking is a fire hazard, consider supervision or cessation. Risk of wandering should be assessed by caregiver and if detected at any point, supervision and safe proof recommendations should be instituted.  MEDICATION SUPERVISION: Inability to self-administer medication needs to be constantly addressed. Implement a mechanism to ensure safe administration of the medications.  RECOMMENDATIONS FOR ALL PATIENTS WITH MEMORY PROBLEMS: 1. Continue to exercise (Recommend 30 minutes of walking everyday, or 3 hours every week) 2. Increase social interactions - continue going to [place] and enjoy social gatherings with friends and family 3. Eat healthy, avoid fried foods and eat more fruits and vegetables 4. Maintain adequate blood  pressure, blood sugar, and blood cholesterol level. Reducing the risk of stroke and cardiovascular disease also helps promoting better memory. 5. Avoid stressful situations. Live a simple life and avoid aggravations. Organize your time and prepare for the next day in anticipation. 6. Sleep well, avoid any interruptions of sleep and avoid any distractions in the bedroom that may interfere with adequate sleep quality 7. Avoid sugar, avoid sweets as there is a strong link between excessive sugar intake, diabetes, and cognitive impairment The Mediterranean diet has been shown to help patients reduce the risk of progressive memory disorders and reduces cardiovascular risk. This includes eating fish, eat fruits and green leafy vegetables, nuts like almonds and hazelnuts, walnuts, and also use olive oil. Avoid fast foods and fried foods as much as possible. Avoid sweets and sugar as sugar use has been linked to worsening of memory function.  There is always a concern of gradual progression of memory problems. If this is the case, then we may need to adjust level of care according to patient needs. Support, both to the patient and caregiver, should then be put into place.

## 2021-05-17 NOTE — Progress Notes (Signed)
NEUROLOGY FOLLOW UP OFFICE NOTE  ALLEXA Kaiser [id number] [birth year]  HISTORY OF PRESENT ILLNESS: I had the pleasure of seeing Amy Kaiser in follow-up in the neurology clinic on 05/17/2021.  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 She is again accompanied by her long-time friend Amy Kaiser who helps supplement the history today. She states her memory is good in spots and places. She lives alone, her daughter lives next door. She manages her own medications and has her own system, turning her bottles upside down after she has taken them. She misses medications once in a while but "not bad like it was." Family help with meals. She does not drive. She does not sleep well stating she does not have a routine anymore. She naps before lunch. Eyes are blurred today because she did not sleep well. She denies any hallucinations, Amy Kaiser agrees and states she has not talked about the snakes or people like before. She is now on a daily medication for UTI prophylaxis and tries to keep hydrated. She denies any headaches, dizziness, no falls. She had right knee surgery 2 weeks ago with continued pain, she declined physical therapy. She tries to keep active watering her flowers this morning.    History on Initial Assessment 09/09/2020: This is an 87 year old right-handed woman with a history of hyperlipidemia, migraines, COPD, cervical cancer, depression, anxiety, presenting for evaluation of visual hallucinations. Amy Kaiser, her friend of 56 years, is present to provide additional information. After the visit, I also spoke to her daughter Amy Kaiser on the phone. She lives alone. She does not think her memory is good at all. Her daughter started noticing changes for quite a while, she lost her husband, then her 2 sons, worse in the past 2-3 years when her second son passed away. She repeats herself. She reports she has a terrible time remembering  her medications, worse in the past year. She would not recall if she took it already. They tried different ways, she had difficulty with a pillbox. Her daughter and granddaughter live across the street, they come and put medications in her pillbox, calling her daily to remind to take them. Her daughter took over finances last year. She does not drive. She stopped driving after heart surgery years ago when she was "losing control" of her body, no loss of consciousness. This is not occurring any longer. She has not been bathing as much as she wants to due to her right knee or dizziness, she does not feel safe. She does regular "bird baths." She has lost her appetite, her daughter brings food. She had burned a couple of pots. She gets lost in her own house, thinking she is in her bedroom then waking up in the dining room not knowing where she was.  Amy Kaiser denies any personality changes.   She started having hallucinations within the past year. Her daughter reports hallucinations worsened when a friend of hers was diagnosed with cancer and passed away around 6 months ago. They are sporadic, she called her daughter at 2AM one time asking where her baby was, another time she saw a snake at 2AM. She thought her sons (who have passed away) visited her, she saw them at the door and heard them, but did not find them in the living room after. The last time she had a hallucination was 3 months ago, she saw her daughter-in-law in the yard. Amy Kaiser adds that one time she  saw a snake in her covers, she was confused and found to have a UTI. She has a history of frequent UTIs. She reports that her mother had visual hallucinations in her late 26s and saw bugs on her.  She reports terrible headaches which are new, at one point she had 3 days where she took Tylenol twice a day. Headaches have quieted down, she has had 1 or 2 since then. She denies any dizziness, diplopia, dysarthria/dysphagia, neck/back pain, focal  numbness/tingling/weakness, bowel/bladder dysfunction. She has tremors/"shakes" when her glucose levels are low, none if eating fine. No anosmia. Over the past 2 years, she stumbles around a lot and uses a cane outside. Sleep is good, no clear REM behavior disorder. No history of significant head injuries or alcohol use. .  I personally reviewed MRI brain without contrast done 06/2020 which did not show any acute changes. There was mild diffuse atrophy, moderate chronic microvascular disease.   PAST MEDICAL HISTORY: Past Medical History:  Diagnosis Date   Anemia    Anxiety    Arthritis    "just about all over" (03/11/2017)   Asthma    Cervical cancer ([place])    Chronic UTI    COPD (chronic obstructive pulmonary disease) ([place])    Coronary artery disease    Depression    Heart murmur    Hip fracture ([facility])    left hip   Hyperlipidemia    Low blood sugar    Migraine    "work related; quit when I quit work in ~ 1997" (03/11/2017)   PONV (postoperative nausea and vomiting)    Skin cancer    back    MEDICATIONS: Current Outpatient Medications on File Prior to Visit  Medication Sig Dispense Refill   aspirin EC 81 MG tablet Take 81 mg by mouth daily.     calcium-vitamin D (OSCAL WITH D) 500-200 MG-UNIT tablet Take 1 tablet by mouth 2 (two) times daily.     Cholecalciferol (VITAMIN D3) 1000 units CAPS Take 1,000 Units by mouth daily with lunch.      furosemide (LASIX) 20 MG tablet Take 1 tablet (20 mg total) by mouth every Monday, Wednesday, and Friday. 39 tablet 3   metoprolol tartrate (LOPRESSOR) 25 MG tablet Take 1/2 tablet (12.5 mg) by mouth twice a day 90 tablet 3   nitrofurantoin (MACRODANTIN) 100 MG capsule Take 100 mg by mouth daily.     nitroGLYCERIN (NITROSTAT) 0.4 MG SL tablet DISSOLVE ONE TABLET UNDER THE TONGUE EVERY 5 MINUTES AS NEEDED FOR CHEST PAIN.  DO NOT EXCEED A TOTAL OF 3 DOSES IN 15 MINUTES NOW 25 tablet 0   rosuvastatin (CRESTOR) 20 MG tablet Take 20 mg by mouth daily.      vitamin B-12 (CYANOCOBALAMIN) 500 MCG tablet Take 1 tablet (500 mcg total) by mouth daily. 90 tablet 0   Multiple Vitamins-Minerals (ICAPS AREDS 2 PO) Take 1 capsule by mouth 2 (two) times daily. (Patient not taking: Reported on 05/17/2021)     ondansetron (ZOFRAN) 4 MG tablet Take 1 tablet (4 mg total) by mouth every 8 (eight) hours as needed for nausea or vomiting. (Patient not taking: Reported on 05/17/2021) 20 tablet 0   traMADol (ULTRAM) 50 MG tablet TAKE 1 TO 2 TABLETS BY MOUTH EVERY 6 HOURS AS NEEDED (Patient not taking: Reported on 05/17/2021) 30 tablet 0   No current facility-administered medications on file prior to visit.    ALLERGIES: Allergies  Allergen Reactions   Latex Hives, Itching and Other (  See Comments)    Caused blisters in her mouth   Lisinopril Other (See Comments)    Weakness    Penicillins Other (See Comments)    Intolerance to ALL "CILLINS" > UNSPECIFIED REACTIONS  Has patient had a PCN reaction causing immediate rash, facial/tongue/throat swelling, SOB or lightheadedness with hypotension: UNSPECIFIED REACTION  Has patient had a PCN reaction causing severe rash involving mucus membranes or skin necrosis: UNSPECIFIED REACTION  Has patient had a PCN reaction that required hospitalization UNSPECIFIED REACTION  Has patient had a PCN reaction occurring within the last 10 years: UNSPECIFIED REACTION    Pravachol [Pravastatin Sodium] Other (See Comments)    MYALGIAs LEG PAIN    Alendronate Sodium Other (See Comments)    UNSPECIFIED REACTION  [Patient denies this allergy]    Other Other (See Comments)   Sulfamethoxazole-Trimethoprim Other (See Comments)    Pt denies    Codeine Nausea And Vomiting   Morphine And Related Nausea And Vomiting   Prozac [Fluoxetine Hcl] Anxiety    "FEELS SHAKY"    Symbicort [Budesonide-Formoterol Fumarate] Anxiety    "SHAKY"    Wellbutrin [Bupropion] Anxiety    "JITTERY"     FAMILY HISTORY: History reviewed. No pertinent  family history.  SOCIAL HISTORY: Social History   Socioeconomic History   Marital status: Widowed    Spouse name: Not on file   Number of children: 3   Years of education: Not on file   Highest education level: Not on file  Occupational History   Not on file  Tobacco Use   Smoking status: Never   Smokeless tobacco: Never  Vaping Use   Vaping Use: Never used  Substance and Sexual Activity   Alcohol use: No   Drug use: No   Sexual activity: Not Currently  Other Topics Concern   Not on file  Social History Narrative   Right handed    Lives alone   Social Determinants of Health   Financial Resource Strain: Not on file  Food Insecurity: Not on file  Transportation Needs: Not on file  Physical Activity: Not on file  Stress: Not on file  Social Connections: Not on file  Intimate Partner Violence: Not on file     PHYSICAL EXAM: Vitals:   05/17/21 1450  BP: 137/74  Pulse: 80  SpO2: 98%   General: No acute distress Head:  Normocephalic/atraumatic Skin/Extremities: No rash, no edema Neurological Exam: alert and oriented to person, place, and time. No aphasia or dysarthria. Fund of knowledge is appropriate.  Recent and remote memory are impaired, 1/3 delayed recall. Attention and concentration are normal, 4/5 WORLD backwards.   Cranial nerves: Pupils equal, round. Extraocular movements intact with no nystagmus. Visual fields full.  No facial asymmetry.  Motor: Bulk and tone normal, muscle strength 5/5 throughout with no pronator drift.   Finger to nose testing intact.  Gait slow and cautious, no ataxia  IMPRESSION: This is a 87 yo RH woman with a history of hyperlipidemia, migraines, COPD, cervical cancer, depression, anxiety, with Alzheimer's disease with behavioral changes (visual hallucinations). [place] 19/30 in 09/2020. Symptoms overall stable, hallucinations have been quiet. She is not on any dementia medications, we had previously discussed weighing potential benefits  versus side effects, especially at her age. Continue control of vascular risk factors, physical exercise and brain stimulation exercises, MIND diet for overall brain health. Continue close supervision. She does not drive. Follow-up as needed, call for any changes.    Thank you for allowing me to  participate in her care.  Please do not hesitate to call for any questions or concerns.   Ellouise Newer, M.D.   CC: Dr. Dema Severin

## 2021-05-31 ENCOUNTER — Encounter: Payer: Self-pay | Admitting: Orthopaedic Surgery

## 2021-05-31 ENCOUNTER — Ambulatory Visit (INDEPENDENT_AMBULATORY_CARE_PROVIDER_SITE_OTHER): Payer: Medicare Other | Admitting: Orthopaedic Surgery

## 2021-05-31 ENCOUNTER — Other Ambulatory Visit: Payer: Self-pay

## 2021-05-31 DIAGNOSIS — Z9889 Other specified postprocedural states: Secondary | ICD-10-CM

## 2021-05-31 DIAGNOSIS — S83271D Complex tear of lateral meniscus, current injury, right knee, subsequent encounter: Secondary | ICD-10-CM

## 2021-05-31 NOTE — Progress Notes (Signed)
The patient is a 87 year old female who is about 10 to 11 weeks status post a right knee arthroscopy with a partial meniscectomy.  We did not find any significant cartilage wear in the knee.  I saw her at her last visit and we place a steroid injection in the knee which she stated was very helpful.  She does ambulate with a cane.  Her family is with her today.  She feels like she is getting better every day and mobilizing better as well.  Examination of her right knee shows excellent range of motion of that knee.  There is still a mild to moderate effusion and I aspirated 30 cc of fluid off of her knee.  She will continue to increase her activities as she tolerates.  We can certainly see her back in 3 months to make sure she is doing well and can always aspirate and inject her knee again at that visit if needed.  All questions and concerns were answered and addressed.

## 2021-06-20 DIAGNOSIS — E559 Vitamin D deficiency, unspecified: Secondary | ICD-10-CM | POA: Diagnosis not present

## 2021-06-20 DIAGNOSIS — I10 Essential (primary) hypertension: Secondary | ICD-10-CM | POA: Diagnosis not present

## 2021-06-20 DIAGNOSIS — D649 Anemia, unspecified: Secondary | ICD-10-CM | POA: Diagnosis not present

## 2021-06-20 DIAGNOSIS — E785 Hyperlipidemia, unspecified: Secondary | ICD-10-CM | POA: Diagnosis not present

## 2021-06-20 DIAGNOSIS — E538 Deficiency of other specified B group vitamins: Secondary | ICD-10-CM | POA: Diagnosis not present

## 2021-08-18 ENCOUNTER — Other Ambulatory Visit: Payer: Self-pay | Admitting: Cardiovascular Disease

## 2021-08-30 ENCOUNTER — Other Ambulatory Visit: Payer: Self-pay

## 2021-08-30 ENCOUNTER — Encounter: Payer: Self-pay | Admitting: Orthopaedic Surgery

## 2021-08-30 ENCOUNTER — Ambulatory Visit: Payer: Medicare Other | Admitting: Orthopaedic Surgery

## 2021-08-30 DIAGNOSIS — Z9889 Other specified postprocedural states: Secondary | ICD-10-CM

## 2021-08-30 NOTE — Progress Notes (Signed)
The patient is a 87 year old female well-known to [place].  We performed an arthroscopic intervention for a lateral meniscal tear with a partial lateral meniscectomy.  She does ambulate with a cane.  She had a mechanical fall yesterday at the bottom of some stairs falling backwards.  She and her daughter stated this was related to shoe wear and her right she was wearing out and she is going to get new shoes today.  She says the right knee is doing well.  There is only a small effusion with her right knee today.  It does seem to move well.  There are really no issues on today's visit.  She said it is doing well and she does not need any type of injection.  All questions/concerns were answered and addressed.  Follow-up can be as needed.

## 2021-10-01 NOTE — Progress Notes (Signed)
Cardiology Office Note  Date:  10/02/2021   ID:  Amy Kaiser, DOB [birth year], MRN [id number]  PCP:  Harlan Stains, MD   Chief Complaint  Patient presents with   9 month follow up     Patient c/o shortness of breath, lack of energy and left ankle swelling. Medications reviewed by the patient verbally.     HPI:  Amy Kaiser is a 87 y.o. female with PMH of  severe aortic stenosis with progressive symptoms of diastolic heart failure  TAVR with a 20 mm Sapien 3 valve via a percutaneous transfemoral approach 04/09/2017.   severe stenosis of the proximal RCA  stenting with a 4 mm drug-eluting stent 03/11/2017.  Who presents for f/u of her TAVR  This AM, no energy in the shower Wonders if she is anemic No recent lab work her primary care  Sleeping well  Busy in the day, Takes care of house, gets tired  Walks with a cane, no falls Walks around the neighborhood to see her family No chest pain or shortness of breath  Knee surgery 03/2021  EKG personally reviewed by myself on todays visit Shows normal sinus rhythm rate 58 bpm left axis deviation no other significant ST or T wave changes  Other past medical history reviewed Hallucinations last month Seen by neurology memory changes several years ago, worse in the past 2-3 years MRI brain shows diffuse atrophy, chronic microvascular disease. Symptoms suggestive of Alzheimer's disease with behavioral disturbance per neuro  Carotid: Nonobstructive Reports echo was done: not available Reports it was done through [facility] imaging, no report or images available for review  echocardiogram  moderately elevated right heart pressures, normal prosthetic aortic valve, moderate MR and ejection fraction 60%   PMH:   has a past medical history of Anemia, Anxiety, Arthritis, Asthma, Cervical cancer ([place]), Chronic UTI, COPD (chronic obstructive pulmonary disease) ([place]), Coronary artery disease, Depression, Heart murmur, Hip fracture  ([place]), Hyperlipidemia, Low blood sugar, Migraine, PONV (postoperative nausea and vomiting), and Skin cancer.  PSH:    Past Surgical History:  Procedure Laterality Date   APPENDECTOMY     CARDIAC CATHETERIZATION  02/2017   CATARACT EXTRACTION W/ INTRAOCULAR LENS  IMPLANT, BILATERAL Bilateral    CORONARY ANGIOPLASTY WITH STENT PLACEMENT  03/11/2017   CORONARY STENT INTERVENTION N/A 03/11/2017   Procedure: Coronary Stent Intervention;  Surgeon: Sherren Mocha, MD;  Location: [place] [facility];  Service: Cardiovascular;  Laterality: N/A;   IR RADIOLOGY PERIPHERAL GUIDED IV START  02/26/2017   IR US GUIDE VASC ACCESS RIGHT  02/26/2017   KNEE ARTHROSCOPY Right 03/09/2021   Procedure: RIGHT KNEE ARTHROSCOPY WITH PARTIAL MEDIAL AND LATERAL MENISCECTOMIES;  Surgeon: [name], MD;  Location: [place];  Service: Orthopedics;  Laterality: Right;   RIGHT/LEFT HEART CATH AND CORONARY ANGIOGRAPHY N/A 02/20/2017   Procedure: Right/Left Heart Cath and Coronary Angiography;  Surgeon: Sherren Mocha, MD;  Location: [place] [facility];  Service: Cardiovascular;  Laterality: N/A;   SKIN CANCER EXCISION     "back"   TEE WITHOUT CARDIOVERSION N/A 04/09/2017   Procedure: TRANSESOPHAGEAL ECHOCARDIOGRAM (TEE);  Surgeon: Sherren Mocha, MD;  Location: [place];  Service: Open Heart Surgery;  Laterality: N/A;   TRANSCATHETER AORTIC VALVE REPLACEMENT, TRANSFEMORAL N/A 04/09/2017   Procedure: TRANSCATHETER AORTIC VALVE REPLACEMENT, TRANSFEMORAL;  Surgeon: Sherren Mocha, MD;  Location: [place];  Service: Open Heart Surgery;  Laterality: N/A;   VAGINAL HYSTERECTOMY      Current Outpatient Medications  Medication  Sig Dispense Refill   aspirin (ASPIRIN 81) 81 MG EC tablet Take 162 mg by mouth daily. Swallow whole.     calcium-vitamin D (OSCAL WITH D) 500-200 MG-UNIT tablet Take 1 tablet by mouth 2 (two) times daily.     Cholecalciferol (VITAMIN D3) 1000 units CAPS Take 1,000 Units by mouth daily  with lunch.      ferrous sulfate 324 MG TBEC Take 324 mg by mouth daily with breakfast.     furosemide (LASIX) 20 MG tablet Take 1 tablet (20 mg total) by mouth every Monday, Wednesday, and Friday. 39 tablet 3   metoprolol tartrate (LOPRESSOR) 25 MG tablet Take 1/2 tablet (12.5 mg) by mouth twice a day 90 tablet 3   Multiple Vitamins-Minerals (ICAPS AREDS 2 PO) Take 1 capsule by mouth 2 (two) times daily.     nitrofurantoin (MACRODANTIN) 100 MG capsule Take 100 mg by mouth daily.     nitroGLYCERIN (NITROSTAT) 0.4 MG SL tablet DISSOLVE ONE TABLET UNDER THE TONGUE EVERY 5 MINUTES AS NEEDED FOR CHEST PAIN.  DO NOT EXCEED A TOTAL OF 3 DOSES IN 15 MINUTES 25 tablet 0   ondansetron (ZOFRAN) 4 MG tablet Take 1 tablet (4 mg total) by mouth every 8 (eight) hours as needed for nausea or vomiting. 20 tablet 0   rosuvastatin (CRESTOR) 20 MG tablet Take 20 mg by mouth daily.     traMADol (ULTRAM) 50 MG tablet TAKE 1 TO 2 TABLETS BY MOUTH EVERY 6 HOURS AS NEEDED 30 tablet 0   vitamin B-12 (CYANOCOBALAMIN) 500 MCG tablet Take 1 tablet (500 mcg total) by mouth daily. 90 tablet 0   No current facility-administered medications for this visit.    Allergies:   Latex, Lisinopril, Penicillins, Pravachol [pravastatin sodium], Alendronate sodium, Amoxicillin, Other, Sulfamethoxazole-trimethoprim, Budesonide-formoterol fumarate, Bupropion, Codeine, Morphine and related, and Prozac [fluoxetine hcl]   Social History:  The patient  reports that she has never smoked. She has never used smokeless tobacco. She reports that she does not drink alcohol and does not use drugs.   Family History:   family history is not on file.    Review of Systems: Review of Systems  Constitutional: Negative.   Respiratory: Negative.    Cardiovascular: Negative.   Gastrointestinal: Negative.   Musculoskeletal: Negative.   Neurological: Negative.   Psychiatric/Behavioral: Negative.    All other systems reviewed and are  negative.  PHYSICAL EXAM: VS:  BP 130/60 (BP Location: Left Arm, Patient Position: Sitting, Cuff Size: Normal)   Pulse 70   Ht 5\' 6"  (1.676 m)   Wt 163 lb (73.9 kg)   SpO2 98%   BMI 26.31 kg/m  , BMI Body mass index is 26.31 kg/m. Constitutional:  oriented to person, place, and time. No distress.  Presenting in a wheelchair HENT:  Head: Grossly normal Eyes:  no discharge. No scleral icterus.  Neck: No JVD, no carotid bruits  Cardiovascular: Regular rate and rhythm, no murmurs appreciated Pulmonary/Chest: Clear to auscultation bilaterally, no wheezes or rails Abdominal: Soft.  no distension.  no tenderness.  Musculoskeletal: Normal range of motion Neurological:  normal muscle tone. Coordination normal. No atrophy Skin: Skin warm and dry Psychiatric: normal affect, pleasant  Recent Labs: 03/06/2021: BUN 22; Creatinine, Ser 0.95; Potassium 4.9; Sodium 138    Lipid Panel No results found for: CHOL, HDL, LDLCALC, TRIG    Wt Readings from Last 3 Encounters:  10/02/21 163 lb (73.9 kg)  05/17/21 167 lb (75.8 kg)  03/09/21 172 lb 6.4 oz (  78.2 kg)      ASSESSMENT AND PLAN:  Chronic diastolic CHF (congestive heart failure) (HCC),  Stable, appears euvolemic  Coronary artery disease with exertional angina ([place]) - Plan: EKG 12-Lead Currently with no symptoms of angina. No further workup at this time. Continue current medication regimen. Stable  Severe aortic stenosis - Plan: EKG 12-Lead TAVR, no significant murmur on exam, early in echo 2023  S/P TAVR (transcatheter aortic valve replacement) Echocardiogram ordered for early 2023  Chronic fatigue Difficult to determine,  Essential hypertension Blood pressure is well controlled on today's visit. No changes made to the medications.   Total encounter time more than 25 minutes Greater than 50% was spent in counseling and coordination of care with the patient    No orders of the defined types were placed in this  encounter.    Signed, Esmond Plants, M.D., Ph.D. 10/02/2021  [place], [place]

## 2021-10-02 ENCOUNTER — Encounter: Payer: Self-pay | Admitting: Cardiovascular Disease

## 2021-10-02 ENCOUNTER — Ambulatory Visit: Payer: Medicare Other | Admitting: Cardiovascular Disease

## 2021-10-02 ENCOUNTER — Other Ambulatory Visit: Payer: Self-pay

## 2021-10-02 VITALS — BP 130/60 | HR 70 | Ht 66.0 in | Wt 163.0 lb

## 2021-10-02 DIAGNOSIS — I493 Ventricular premature depolarization: Secondary | ICD-10-CM

## 2021-10-02 DIAGNOSIS — I25118 Atherosclerotic heart disease of native coronary artery with other forms of angina pectoris: Secondary | ICD-10-CM

## 2021-10-02 DIAGNOSIS — I10 Essential (primary) hypertension: Secondary | ICD-10-CM

## 2021-10-02 DIAGNOSIS — I350 Nonrheumatic aortic (valve) stenosis: Secondary | ICD-10-CM | POA: Diagnosis not present

## 2021-10-02 DIAGNOSIS — I340 Nonrheumatic mitral (valve) insufficiency: Secondary | ICD-10-CM

## 2021-10-02 DIAGNOSIS — Z952 Presence of prosthetic heart valve: Secondary | ICD-10-CM

## 2021-10-02 DIAGNOSIS — I272 Pulmonary hypertension, unspecified: Secondary | ICD-10-CM

## 2021-10-02 DIAGNOSIS — Z01810 Encounter for preprocedural cardiovascular examination: Secondary | ICD-10-CM

## 2021-10-02 NOTE — Patient Instructions (Addendum)
Check out google store for speaker  Medication Instructions:  No changes  If you need a refill on your cardiac medications before your next appointment, please call your pharmacy.    Lab work: No new labs needed  Testing/Procedures: Echo schedule for Feb 2023 Your physician has requested that you have an echocardiogram. Echocardiography is a painless test that uses sound waves to create images of your heart. It provides your doctor with information about the size and shape of your heart and how well your heart's chambers and valves are working. This procedure takes approximately one hour. There are no restrictions for this procedure.  There is a possibility that an IV may need to be started during your test to inject an image enhancing agent. This is done to obtain more optimal pictures of your heart. Therefore we ask that you do at least drink some water prior to coming in to hydrate your veins.    Follow-Up: At [facility], you and your health needs are our priority.  As part of our continuing mission to provide you with exceptional heart care, we have created designated Provider Care Teams.  These Care Teams include your primary Cardiologist (physician) and Advanced Practice Providers (APPs -  Physician Assistants and Nurse Practitioners) who all work together to provide you with the care you need, when you need it.  You will need a follow up appointment in 12 months  Providers on your designated Care Team:   Murray Hodgkins, NP Christell Faith, PA-C Cadence Kathlen Mody, [place]  COVID-19 Vaccine Information can be found at: [email or web address] For questions related to vaccine distribution or appointments, please email [email or web address] or call [phone].

## 2021-10-05 NOTE — Addendum Note (Signed)
Addended by: Janan Ridge on: 10/05/2021 02:50 PM   Modules accepted: Orders

## 2021-10-09 DIAGNOSIS — D509 Iron deficiency anemia, unspecified: Secondary | ICD-10-CM | POA: Diagnosis not present

## 2021-11-13 ENCOUNTER — Emergency Department (HOSPITAL_COMMUNITY): Payer: Medicare Other

## 2021-11-13 ENCOUNTER — Observation Stay (HOSPITAL_COMMUNITY): Payer: Medicare Other

## 2021-11-13 ENCOUNTER — Observation Stay (HOSPITAL_COMMUNITY)
Admission: EM | Admit: 2021-11-13 | Discharge: 2021-11-14 | Disposition: A | Discharge status: Home / Self Care | Payer: Medicare Other | Attending: Internal Medicine | Admitting: Internal Medicine

## 2021-11-13 ENCOUNTER — Other Ambulatory Visit: Payer: Self-pay

## 2021-11-13 DIAGNOSIS — R4182 Altered mental status, unspecified: Secondary | ICD-10-CM | POA: Diagnosis not present

## 2021-11-13 DIAGNOSIS — J45909 Unspecified asthma, uncomplicated: Secondary | ICD-10-CM | POA: Insufficient documentation

## 2021-11-13 DIAGNOSIS — I161 Hypertensive emergency: Secondary | ICD-10-CM | POA: Insufficient documentation

## 2021-11-13 DIAGNOSIS — R410 Disorientation, unspecified: Secondary | ICD-10-CM | POA: Diagnosis not present

## 2021-11-13 DIAGNOSIS — Z7982 Long term (current) use of aspirin: Secondary | ICD-10-CM | POA: Insufficient documentation

## 2021-11-13 DIAGNOSIS — J449 Chronic obstructive pulmonary disease, unspecified: Secondary | ICD-10-CM | POA: Insufficient documentation

## 2021-11-13 DIAGNOSIS — Z79899 Other long term (current) drug therapy: Secondary | ICD-10-CM | POA: Insufficient documentation

## 2021-11-13 DIAGNOSIS — I6381 Other cerebral infarction due to occlusion or stenosis of small artery: Secondary | ICD-10-CM | POA: Diagnosis not present

## 2021-11-13 DIAGNOSIS — Z91040 Latex allergy status: Secondary | ICD-10-CM | POA: Insufficient documentation

## 2021-11-13 DIAGNOSIS — I5030 Unspecified diastolic (congestive) heart failure: Secondary | ICD-10-CM | POA: Insufficient documentation

## 2021-11-13 DIAGNOSIS — I110 Hypertensive heart disease with heart failure: Secondary | ICD-10-CM | POA: Insufficient documentation

## 2021-11-13 DIAGNOSIS — Z8541 Personal history of malignant neoplasm of cervix uteri: Secondary | ICD-10-CM | POA: Insufficient documentation

## 2021-11-13 DIAGNOSIS — R531 Weakness: Secondary | ICD-10-CM | POA: Diagnosis not present

## 2021-11-13 DIAGNOSIS — I5032 Chronic diastolic (congestive) heart failure: Secondary | ICD-10-CM | POA: Diagnosis present

## 2021-11-13 DIAGNOSIS — R4701 Aphasia: Secondary | ICD-10-CM | POA: Diagnosis not present

## 2021-11-13 DIAGNOSIS — R000 Tachycardia, unspecified: Secondary | ICD-10-CM | POA: Diagnosis not present

## 2021-11-13 DIAGNOSIS — Z952 Presence of prosthetic heart valve: Secondary | ICD-10-CM

## 2021-11-13 DIAGNOSIS — Z20822 Contact with and (suspected) exposure to covid-19: Secondary | ICD-10-CM | POA: Insufficient documentation

## 2021-11-13 DIAGNOSIS — Z85828 Personal history of other malignant neoplasm of skin: Secondary | ICD-10-CM | POA: Insufficient documentation

## 2021-11-13 DIAGNOSIS — I25118 Atherosclerotic heart disease of native coronary artery with other forms of angina pectoris: Secondary | ICD-10-CM | POA: Diagnosis present

## 2021-11-13 DIAGNOSIS — I25119 Atherosclerotic heart disease of native coronary artery with unspecified angina pectoris: Secondary | ICD-10-CM | POA: Insufficient documentation

## 2021-11-13 DIAGNOSIS — D509 Iron deficiency anemia, unspecified: Secondary | ICD-10-CM | POA: Insufficient documentation

## 2021-11-13 DIAGNOSIS — Z955 Presence of coronary angioplasty implant and graft: Secondary | ICD-10-CM | POA: Insufficient documentation

## 2021-11-13 DIAGNOSIS — I6782 Cerebral ischemia: Secondary | ICD-10-CM | POA: Diagnosis not present

## 2021-11-13 DIAGNOSIS — G309 Alzheimer's disease, unspecified: Secondary | ICD-10-CM | POA: Diagnosis not present

## 2021-11-13 DIAGNOSIS — I10 Essential (primary) hypertension: Secondary | ICD-10-CM | POA: Diagnosis not present

## 2021-11-13 DIAGNOSIS — F0280 Dementia in other diseases classified elsewhere without behavioral disturbance: Secondary | ICD-10-CM | POA: Diagnosis not present

## 2021-11-13 DIAGNOSIS — G9340 Encephalopathy, unspecified: Principal | ICD-10-CM | POA: Insufficient documentation

## 2021-11-13 DIAGNOSIS — I614 Nontraumatic intracerebral hemorrhage in cerebellum: Secondary | ICD-10-CM | POA: Diagnosis not present

## 2021-11-13 LAB — CBC
HCT: 35.7 % — ABNORMAL LOW (ref 36.0–46.0)
Hemoglobin: 11.4 g/dL — ABNORMAL LOW (ref 12.0–15.0)
MCH: 30.8 pg (ref 26.0–34.0)
MCHC: 31.9 g/dL (ref 30.0–36.0)
MCV: 96.5 fL (ref 80.0–100.0)
Platelets: 236 10*3/uL (ref 150–400)
RBC: 3.7 MIL/uL — ABNORMAL LOW (ref 3.87–5.11)
RDW: 13.6 % (ref 11.5–15.5)
WBC: 9.9 10*3/uL (ref 4.0–10.5)
nRBC: 0 % (ref 0.0–0.2)

## 2021-11-13 LAB — COMPREHENSIVE METABOLIC PANEL
ALT: 10 U/L (ref 0–44)
AST: 21 U/L (ref 15–41)
Albumin: 3.5 g/dL (ref 3.5–5.0)
Alkaline Phosphatase: 77 U/L (ref 38–126)
Anion gap: 9 (ref 5–15)
BUN: 14 mg/dL (ref 8–23)
CO2: 25 mmol/L (ref 22–32)
Calcium: 8.8 mg/dL — ABNORMAL LOW (ref 8.9–10.3)
Chloride: 105 mmol/L (ref 98–111)
Creatinine, Ser: 0.93 mg/dL (ref 0.44–1.00)
GFR, Estimated: 58 mL/min — ABNORMAL LOW (ref >60)
Glucose, Bld: 96 mg/dL (ref 70–99)
Potassium: 4.2 mmol/L (ref 3.5–5.1)
Sodium: 139 mmol/L (ref 135–145)
Total Bilirubin: 0.6 mg/dL (ref 0.3–1.2)
Total Protein: 7.7 g/dL (ref 6.5–8.1)

## 2021-11-13 LAB — RAPID URINE DRUG SCREEN, HOSP PERFORMED
Amphetamines: NOT DETECTED
Barbiturates: NOT DETECTED
Benzodiazepines: NOT DETECTED
Cocaine: NOT DETECTED
Opiates: NOT DETECTED
Tetrahydrocannabinol: NOT DETECTED

## 2021-11-13 LAB — PROTIME-INR
INR: 1 (ref 0.8–1.2)
Prothrombin Time: 12.9 seconds (ref 11.4–15.2)

## 2021-11-13 LAB — DIFFERENTIAL
Abs Immature Granulocytes: 0.02 10*3/uL (ref 0.00–0.07)
Basophils Absolute: 0.1 10*3/uL (ref 0.0–0.1)
Basophils Relative: 1 %
Eosinophils Absolute: 0.3 10*3/uL (ref 0.0–0.5)
Eosinophils Relative: 3 %
Immature Granulocytes: 0 %
Lymphocytes Relative: 34 %
Lymphs Abs: 3.3 10*3/uL (ref 0.7–4.0)
Monocytes Absolute: 0.9 10*3/uL (ref 0.1–1.0)
Monocytes Relative: 9 %
Neutro Abs: 5.3 10*3/uL (ref 1.7–7.7)
Neutrophils Relative %: 53 %

## 2021-11-13 LAB — I-STAT CHEM 8, ED
BUN: 15 mg/dL (ref 8–23)
Calcium, Ion: 1.1 mmol/L — ABNORMAL LOW (ref 1.15–1.40)
Chloride: 103 mmol/L (ref 98–111)
Creatinine, Ser: 0.9 mg/dL (ref 0.44–1.00)
Glucose, Bld: 97 mg/dL (ref 70–99)
HCT: 35 % — ABNORMAL LOW (ref 36.0–46.0)
Hemoglobin: 11.9 g/dL — ABNORMAL LOW (ref 12.0–15.0)
Potassium: 4.1 mmol/L (ref 3.5–5.1)
Sodium: 141 mmol/L (ref 135–145)
TCO2: 26 mmol/L (ref 22–32)

## 2021-11-13 LAB — URINALYSIS, ROUTINE W REFLEX MICROSCOPIC
Bilirubin Urine: NEGATIVE
Glucose, UA: NEGATIVE mg/dL
Hgb urine dipstick: NEGATIVE
Ketones, ur: NEGATIVE mg/dL
Nitrite: NEGATIVE
Protein, ur: NEGATIVE mg/dL
Specific Gravity, Urine: <1.005 — ABNORMAL LOW (ref 1.005–1.030)
pH: 7 (ref 5.0–8.0)

## 2021-11-13 LAB — URINALYSIS, MICROSCOPIC (REFLEX)

## 2021-11-13 LAB — CBG MONITORING, ED: Glucose-Capillary: 102 mg/dL — ABNORMAL HIGH (ref 70–99)

## 2021-11-13 LAB — RESP PANEL BY RT-PCR (FLU A&B, COVID) ARPGX2
Influenza A by PCR: NEGATIVE
Influenza B by PCR: NEGATIVE
SARS Coronavirus 2 by RT PCR: NEGATIVE

## 2021-11-13 LAB — APTT: aPTT: 27 seconds (ref 24–36)

## 2021-11-13 LAB — ETHANOL: Alcohol, Ethyl (B): <10 mg/dL (ref <10)

## 2021-11-13 MED ORDER — ACETAMINOPHEN 160 MG/5ML PO SOLN: 650.0000 mg | ORAL | Status: DC | PRN

## 2021-11-13 MED ORDER — HYDRALAZINE HCL 20 MG/ML IJ SOLN: 10.0000 mg | INTRAMUSCULAR | Status: DC | PRN

## 2021-11-13 MED ORDER — SENNOSIDES-DOCUSATE SODIUM 8.6-50 MG PO TABS: 1.0000 | ORAL_TABLET | Freq: Every evening | ORAL | Status: DC | PRN

## 2021-11-13 MED ORDER — ENOXAPARIN SODIUM 40 MG/0.4ML IJ SOSY
40.0000 mg | PREFILLED_SYRINGE | INTRAMUSCULAR | Status: DC
  Administered 2021-11-13: 40 mg via SUBCUTANEOUS
  Filled 2021-11-13: qty 0.4

## 2021-11-13 MED ORDER — ACETAMINOPHEN 325 MG PO TABS: 650.0000 mg | ORAL_TABLET | ORAL | Status: DC | PRN

## 2021-11-13 MED ORDER — STROKE: EARLY STAGES OF RECOVERY BOOK
Freq: Once | Status: AC
  Filled 2021-11-13: qty 1

## 2021-11-13 MED ORDER — CLEVIDIPINE BUTYRATE 0.5 MG/ML IV EMUL
0.0000 mg/h | INTRAVENOUS | Status: DC
  Administered 2021-11-13: 1 mg/h via INTRAVENOUS

## 2021-11-13 MED ORDER — METOPROLOL TARTRATE 25 MG PO TABS
12.5000 mg | ORAL_TABLET | Freq: Two times a day (BID) | ORAL | Status: DC
  Administered 2021-11-13 – 2021-11-14 (×3): 12.5 mg via ORAL
  Filled 2021-11-13 (×4): qty 1

## 2021-11-13 MED ORDER — CLEVIDIPINE BUTYRATE 0.5 MG/ML IV EMUL
INTRAVENOUS | Status: AC
  Filled 2021-11-13: qty 50

## 2021-11-13 MED ORDER — ACETAMINOPHEN 650 MG RE SUPP: 650.0000 mg | RECTAL | Status: DC | PRN

## 2021-11-13 NOTE — Assessment & Plan Note (Signed)
Stable by echo in 12/2020

## 2021-11-13 NOTE — Assessment & Plan Note (Signed)
Euvolemic on exam today  Echo: 2/22-EF of 65-70% with normal LVF. Grade 1 DD. Also has moderate to severe mitral regurgitation  Monitor I/O Continue lasix three times a week and lopressor

## 2021-11-13 NOTE — Consult Note (Addendum)
NEURO HOSPITALIST CONSULT NOTE   Requesting physician: Dr. Francia Greaves  Reason for Consult: Confusion, decreased verbalization  History obtained from:  Chart, Unable to obtain from patient due to patient's mental status  HPI:                                                                                                                                          Amy BATDORF is a 88 y.o. female with a medical history significant for hyperlipidemia, cervical cancer, coronary artery disease, COPD, chronic UTIs on daily prophylaxis, anxiety, and Alzheimer's dementia with behavioral disturbance / visual hallucinations who presented to the ED 11/13/2021 via EMS as a Code Stroke for evaluation of confusion and decreased verbalization. Patient lives independently with family members that live close by to help with her activities of daily living and patient's daughter reported to EMS that she last spoke with Amy Kaiser at 10:00 this morning in her usual state of health. Some time later, Amy Kaiser called her daughter twice with confusion and repetitive speech prompting EMS activation. EMS states that they found Amy Kaiser laying in her bed, confused, with decreased verbalization and not following commands. While en route, Amy Kaiser began to have stuttering and repetitive speech and began following some simple commands.   At baseline, per Dr. Amparo Bristol note with [facility], patient was last seen in July of 2022. At baseline, patient does live independently with her daughter living next door. Amy Kaiser does not drive and her daughter manages her finances for her. On her initial assessment in November of 2021, the patient had been noted to have declining memory for 2-3 years, trouble with managing her medications, and some trouble with ADLs. Her daughter was noted to bring her meals daily after AmyRodocker frequently got lost in her own home and began to burn pots on the stove.  Her MOCA in  November of 2021 was 19/30.   Past Medical History:  Diagnosis Date   Anemia    Anxiety    Arthritis    "just about all over" (03/11/2017)   Asthma    Cervical cancer ([place])    Chronic UTI    COPD (chronic obstructive pulmonary disease) ([place])    Coronary artery disease    Depression    Heart murmur    Hip fracture (HCC)    left hip   Hyperlipidemia    Low blood sugar    Migraine    "work related; quit when I quit work in ~ 1997" (03/11/2017)   PONV (postoperative nausea and vomiting)    Skin cancer    back    Past Surgical History:  Procedure Laterality Date   APPENDECTOMY     CARDIAC CATHETERIZATION  02/2017   CATARACT EXTRACTION W/ INTRAOCULAR LENS  IMPLANT, BILATERAL Bilateral    CORONARY ANGIOPLASTY WITH STENT PLACEMENT  03/11/2017   CORONARY STENT INTERVENTION N/A 03/11/2017   Procedure: Coronary Stent Intervention;  Surgeon: Sherren Mocha, MD;  Location: [place] [facility];  Service: Cardiovascular;  Laterality: N/A;   IR RADIOLOGY PERIPHERAL GUIDED IV START  02/26/2017   IR US GUIDE VASC ACCESS RIGHT  02/26/2017   KNEE ARTHROSCOPY Right 03/09/2021   Procedure: RIGHT KNEE ARTHROSCOPY WITH PARTIAL MEDIAL AND LATERAL MENISCECTOMIES;  Surgeon: [name], MD;  Location: [place];  Service: Orthopedics;  Laterality: Right;   RIGHT/LEFT HEART CATH AND CORONARY ANGIOGRAPHY N/A 02/20/2017   Procedure: Right/Left Heart Cath and Coronary Angiography;  Surgeon: Sherren Mocha, MD;  Location: [place] [facility];  Service: Cardiovascular;  Laterality: N/A;   SKIN CANCER EXCISION     "back"   TEE WITHOUT CARDIOVERSION N/A 04/09/2017   Procedure: TRANSESOPHAGEAL ECHOCARDIOGRAM (TEE);  Surgeon: Sherren Mocha, MD;  Location: [place];  Service: Open Heart Surgery;  Laterality: N/A;   TRANSCATHETER AORTIC VALVE REPLACEMENT, TRANSFEMORAL N/A 04/09/2017   Procedure: TRANSCATHETER AORTIC VALVE REPLACEMENT, TRANSFEMORAL;  Surgeon: Sherren Mocha, MD;  Location: [place];   Service: Open Heart Surgery;  Laterality: N/A;   VAGINAL HYSTERECTOMY      No family history on file.           Social History:  reports that she has never smoked. She has never used smokeless tobacco. She reports that she does not drink alcohol and does not use drugs.  Allergies  Allergen Reactions   Latex Hives, Itching and Other (See Comments)    Caused blisters in her mouth   Lisinopril Other (See Comments)    Weakness    Penicillins Other (See Comments)    Intolerance to ALL "CILLINS" > UNSPECIFIED REACTIONS  Has patient had a PCN reaction causing immediate rash, facial/tongue/throat swelling, SOB or lightheadedness with hypotension: UNSPECIFIED REACTION  Has patient had a PCN reaction causing severe rash involving mucus membranes or skin necrosis: UNSPECIFIED REACTION  Has patient had a PCN reaction that required hospitalization UNSPECIFIED REACTION  Has patient had a PCN reaction occurring within the last 10 years: UNSPECIFIED REACTION    Pravachol [Pravastatin Sodium] Other (See Comments)    MYALGIAs LEG PAIN    Alendronate Sodium Other (See Comments)    UNSPECIFIED REACTION  [Patient denies this allergy]    Amoxicillin Other (See Comments)    Doesn't recall   Other Other (See Comments)   Sulfamethoxazole-Trimethoprim Other (See Comments)    Pt denies    Budesonide-Formoterol Fumarate Anxiety and Other (See Comments)    "SHAKY"    Bupropion Anxiety and Other (See Comments)    "JITTERY"    Codeine Nausea And Vomiting and Other (See Comments)   Morphine And Related Nausea And Vomiting   Prozac [Fluoxetine Hcl] Anxiety    "FEELS SHAKY"     HOME MEDICATIONS:  No current facility-administered medications on file prior to encounter.   Current Outpatient Medications on File Prior to Encounter  Medication Sig Dispense Refill   aspirin 81 MG EC  tablet Take 162 mg by mouth daily. Swallow whole.     calcium-vitamin D (OSCAL WITH D) 500-200 MG-UNIT tablet Take 1 tablet by mouth 2 (two) times daily.     Cholecalciferol (VITAMIN D3) 1000 units CAPS Take 1,000 Units by mouth daily with lunch.      ferrous sulfate 324 MG TBEC Take 324 mg by mouth 2 (two) times a week.     furosemide (LASIX) 20 MG tablet Take 1 tablet (20 mg total) by mouth every Monday, Wednesday, and Friday. 39 tablet 3   metoprolol tartrate (LOPRESSOR) 25 MG tablet Take 1/2 tablet (12.5 mg) by mouth twice a day 90 tablet 3   Multiple Vitamins-Minerals (ICAPS AREDS 2 PO) Take 1 capsule by mouth 2 (two) times daily.     nitrofurantoin (MACRODANTIN) 100 MG capsule Take 100 mg by mouth daily.     nitroGLYCERIN (NITROSTAT) 0.4 MG SL tablet DISSOLVE ONE TABLET UNDER THE TONGUE EVERY 5 MINUTES AS NEEDED FOR CHEST PAIN.  DO NOT EXCEED A TOTAL OF 3 DOSES IN 15 MINUTES 25 tablet 0   ondansetron (ZOFRAN) 4 MG tablet Take 1 tablet (4 mg total) by mouth every 8 (eight) hours as needed for nausea or vomiting. 20 tablet 0   rosuvastatin (CRESTOR) 20 MG tablet Take 20 mg by mouth at bedtime.     vitamin B-12 (CYANOCOBALAMIN) 500 MCG tablet Take 1 tablet (500 mcg total) by mouth daily. 90 tablet 0   traMADol (ULTRAM) 50 MG tablet TAKE 1 TO 2 TABLETS BY MOUTH EVERY 6 HOURS AS NEEDED (Patient not taking: Reported on 11/13/2021) 30 tablet 0     ROS:                                                                                                                                       Patient is unable to provide a ROS due to confusion.    Blood pressure (!) 222/86.   General Examination:                                                                                                       Physical Exam  HEENT-  [place]/AT   Lungs- Respirations unlabored Extremities- Warm and well perfused   Neurological Examination Mental Status: Awake with decreased level of alertness and hypophonic,  stuttering speech with increased latencies of verbal and motor responses on initial exam. Following correction of her severe HTN, the patient was alert and fully oriented, able to converse in complete sentences, with naming and comprehension intact.  Cranial Nerves: II: Temporal visual fields intact with no extinction to DSS.   III,IV, VI: No ptosis. No nystagmus. EOMI.  V: Temp sensation equal bilaterally  VII: Smile symmetric VIII: Hearing intact to voice IX,X: Initial exam with hypophonic speech, which resolved with normalization of BP XI: Symmetric XII: midline tongue extension Motor: Right : Upper extremity   5/5    Left:     Upper extremity   5/5  Lower extremity   5/5     Lower extremity   5/5 Normal tone throughout; no atrophy noted Sensory: Temp and light touch intact throughout, bilaterally. No extinction to DSS.  Deep Tendon Reflexes: 2+ and symmetric throughout Plantars: Right: downgoing   Left: downgoing Cerebellar: No ataxia with FNF bilaterally. Action tremor is noted, left worse than right Gait: Deferred   Lab Results: Basic Metabolic Panel: No results for input(s): NA, K, CL, CO2, GLUCOSE, BUN, CREATININE, CALCIUM, MG, PHOS in the last 168 hours.  CBC: No results for input(s): WBC, NEUTROABS, HGB, HCT, MCV, PLT in the last 168 hours.  Cardiac Enzymes: No results for input(s): CKTOTAL, CKMB, CKMBINDEX, TROPONINI in the last 168 hours.  Lipid Panel: No results for input(s): CHOL, TRIG, HDL, CHOLHDL, VLDL, LDLCALC in the last 168 hours.  Imaging:  CT head:  - No evidence of acute intracranial abnormality. - Moderately severe chronic small vessel ischemic changes within the cerebral white matter. - Redemonstrated chronic lacunar infarct within the left caudate head. - Mild generalized parenchymal atrophy. - Mild mucosal thickening within the right frontoethmoidal recess.  Assessment: 88 year old female presenting with AMS 1. Exam reveals no focal deficit.  Confusion and hesitant speech have resolved in tandem with improved BP on clevidipine drip.  2. CT head: No acute abnormality.  3. Most likely etiology for the patient's presentation is hypertensive encephalopathy. Subclinical seizure is felt to be unlikely, but will obtain EEG to further evaluate. Migraine accompaniment is possible (she does have a remote history of migraine with aura) but this is low on the DDx given her age and presence of severe HTN offering a better explanation for her presentation.  4. Transient spell of altered mental status/altered sensorium can occur in Lewy body dementia. Regarding this component of the DDx, she does have a personal history of tremor, a family history of Parkinson's disease in two close relatives, a several month period of formed visual hallucinations about 2-3 years ago, history of memory loss and one prior episode of abruptly worsened mentation followed by spontaneous recovery. Of note, she does have a diagnosis of Alzheimer's dementia with behavioral disturbance / visual hallucinations, but DDx also includes Lewy body dementia.   Recommendations: 1. Unable to obtain MRI due to cardiac valve replacement. For repeat imaging studies will need to rely on CT.  2. EEG.  3. BP management with clevidipine gtt. Goal SBP < 160.  4. Outpatient Neurology follow up for evaluation of possible incipient Lewy body dementia.   Addendum: EEG: This study is within normal limits. No seizures or epileptiform discharges were seen throughout the recording.  Electronically signed: Dr. Kerney Elbe 11/13/2021, 12:57 PM

## 2021-11-13 NOTE — Code Documentation (Signed)
Stroke Response Nurse Documentation Code Documentation  Amy Kaiser is a 88 y.o. female arriving to [facility] ED via [facility] on 11/13/21 with past medical hx of HLD, TIA, HTN, UTI, CHF, COPD, CAD. On aspirin 81 mg daily. Code stroke was activated by [facility]. BP 210/102, CBG 102.    Patient from home alone where she was LKW at 1000 and now complaining of AMS. Patient spoke with daughter on the phone at 1000 and was normal. She called her daughter on the phone around 1200 and once again repeating the same things over and over. Daughter came to the house and patient was confused so she called EMS.     Stroke team at the bedside on patient arrival. Labs drawn and patient cleared for CT by Dr. Francia Greaves. Patient to CT with team. NIHSS 4, see documentation for details and code stroke times. Patient with disoriented and bilateral leg weakness on exam. The following imaging was completed:  CT head WO. Patient is not a candidate for IV Thrombolytic due to stroke not suspected, symptoms too mild. Patient is not a candidate for IR due to no LVO suspected. BP in the 200's. Cleviprex gtt started per MD order. Pt states she is being treated for a UTI currently. She says she did not take her medicine this am because she was feeling sick.   Care/Plan: BP goals <160, Q2 neuro checks.   Bedside handoff with ED RN Lysbeth Galas.    Bryndon Cumbie, Rande Brunt  Stroke Response RN

## 2021-11-13 NOTE — ED Notes (Signed)
[name] (daughter)  [phone] would like to be called with any updates.

## 2021-11-13 NOTE — ED Provider Notes (Signed)
[facility] EMERGENCY DEPARTMENT Provider Note   CSN: [id number] Arrival date & time: 11/13/21  1247  An emergency department physician performed an initial assessment on this suspected stroke patient at 1255.  History  Chief Complaint  Patient presents with   Code Stroke    Amy Kaiser is a 88 y.o. female.  88 year old female with prior medical history as detailed below presents for evaluation.  Patient arrives by EMS.  Patient's daughter called 911 after the patient appeared confused.  She was repeating herself.  She was slow to respond.  Symptoms began at around 11:45 AM.  Patient was not following commands with EMS.  Code stroke was initiated by EMS.  Patient was seen by the neuro team on arrival to the ED.  She was noted to be significantly hypertensive with a 211/102 as her initial BP.  At the time of my personal evaluation she seems improved.  She is mentating well.  She denies any significant complaint at the time of my evaluation.  The history is provided by the patient, medical records, the EMS personnel and a relative.  Altered Mental Status Presenting symptoms: confusion   Severity:  Moderate Most recent episode:  Today Episode history:  Single Duration:  1 hour Timing:  Intermittent Progression:  Resolved Chronicity:  New     Home Medications Prior to Admission medications   Medication Sig Start Date End Date Taking? Authorizing Provider  aspirin (ASPIRIN 81) 81 MG EC tablet Take 162 mg by mouth daily. Swallow whole.    [provider]  calcium-vitamin D (OSCAL WITH D) 500-200 MG-UNIT tablet Take 1 tablet by mouth 2 (two) times daily.    [provider]  Cholecalciferol (VITAMIN D3) 1000 units CAPS Take 1,000 Units by mouth daily with lunch.     [provider]  ferrous sulfate 324 MG TBEC Take 324 mg by mouth daily with breakfast.    [provider]  furosemide (LASIX) 20 MG tablet Take 1 tablet (20 mg  total) by mouth every Monday, Wednesday, and Friday. 12/23/20 10/02/21  [name], MD  metoprolol tartrate (LOPRESSOR) 25 MG tablet Take 1/2 tablet (12.5 mg) by mouth twice a day 11/17/19   [name], MD  Multiple Vitamins-Minerals (ICAPS AREDS 2 PO) Take 1 capsule by mouth 2 (two) times daily.    [provider]  nitrofurantoin (MACRODANTIN) 100 MG capsule Take 100 mg by mouth daily.    [provider]  nitroGLYCERIN (NITROSTAT) 0.4 MG SL tablet DISSOLVE ONE TABLET UNDER THE TONGUE EVERY 5 MINUTES AS NEEDED FOR CHEST PAIN.  DO NOT EXCEED A TOTAL OF 3 DOSES IN 15 MINUTES 08/18/21   [name], [name], MD  ondansetron (ZOFRAN) 4 MG tablet Take 1 tablet (4 mg total) by mouth every 8 (eight) hours as needed for nausea or vomiting. 03/09/21   [name], MD  rosuvastatin (CRESTOR) 20 MG tablet Take 20 mg by mouth daily. 07/02/19   [provider]  traMADol (ULTRAM) 50 MG tablet TAKE 1 TO 2 TABLETS BY MOUTH EVERY 6 HOURS AS NEEDED 03/09/21   [name], MD  vitamin B-12 (CYANOCOBALAMIN) 500 MCG tablet Take 1 tablet (500 mcg total) by mouth daily. 02/02/19   [name], [name], MD      Allergies    Latex, Lisinopril, Penicillins, Pravachol [pravastatin sodium], Alendronate sodium, Amoxicillin, Crestor [rosuvastatin], Other, Sulfamethoxazole-trimethoprim, Budesonide-formoterol fumarate, Bupropion, Codeine, Morphine and related, and Prozac [fluoxetine hcl]    Review  of Systems   Review of Systems  Psychiatric/Behavioral:  Positive for confusion.   All other systems reviewed and are negative.  Physical Exam Updated Vital Signs BP (!) 164/56    Pulse 97    Temp 98.8 F (37.1 C) (Oral)    Resp 14    Ht 5\' 6"  (1.676 m)    Wt 74.4 kg Comment: wt minus stretcher weight   SpO2 95%    BMI 26.47 kg/m  Physical Exam Vitals and nursing note reviewed.  Constitutional:      General: She is not in acute distress.    Appearance: Normal appearance. She is  well-developed.  HENT:     Head: Normocephalic and atraumatic.  Eyes:     Conjunctiva/sclera: Conjunctivae normal.     Pupils: Pupils are equal, round, and reactive to light.  Cardiovascular:     Rate and Rhythm: Normal rate and regular rhythm.     Heart sounds: Normal heart sounds.  Pulmonary:     Effort: Pulmonary effort is normal. No respiratory distress.     Breath sounds: Normal breath sounds.  Abdominal:     General: There is no distension.     Palpations: Abdomen is soft.     Tenderness: There is no abdominal tenderness.  Musculoskeletal:        General: No deformity. Normal range of motion.     Cervical back: Normal range of motion and neck supple.  Skin:    General: Skin is warm and dry.  Neurological:     General: No focal deficit present.     Mental Status: She is alert and oriented to person, place, and time. Mental status is at baseline.     Cranial Nerves: No cranial nerve deficit.     Sensory: No sensory deficit.     Motor: No weakness.     Coordination: Coordination normal.    ED Results / Procedures / Treatments   Labs (all labs ordered are listed, but only abnormal results are displayed) Labs Reviewed  CBC - Abnormal; Notable for the following components:      Result Value   RBC 3.70 (*)    Hemoglobin 11.4 (*)    HCT 35.7 (*)    All other components within normal limits  COMPREHENSIVE METABOLIC PANEL - Abnormal; Notable for the following components:   Calcium 8.8 (*)    GFR, Estimated 58 (*)    All other components within normal limits  CBG MONITORING, ED - Abnormal; Notable for the following components:   Glucose-Capillary 102 (*)    All other components within normal limits  I-STAT CHEM 8, ED - Abnormal; Notable for the following components:   Calcium, Ion 1.10 (*)    Hemoglobin 11.9 (*)    HCT 35.0 (*)    All other components within normal limits  RESP PANEL BY RT-PCR (FLU A&B, COVID) ARPGX2  ETHANOL  PROTIME-INR  APTT  DIFFERENTIAL  RAPID  URINE DRUG SCREEN, HOSP PERFORMED  URINALYSIS, ROUTINE W REFLEX MICROSCOPIC    EKG EKG Interpretation  Date/Time:  Monday November 13 2021 13:10:17 EST Ventricular Rate:  102 PR Interval:  198 QRS Duration: 102 QT Interval:  357 QTC Calculation: 465 R Axis:   -51 Text Interpretation: Sinus tachycardia Consider left atrial enlargement Left anterior fascicular block Abnormal R-wave progression, late transition Probable left ventricular hypertrophy Confirmed by Dene Gentry [phone]) on 11/13/2021 1:19:15 PM  Radiology DG Chest Port 1 View  Result Date: 11/13/2021 CLINICAL DATA:  weakness EXAM:  PORTABLE CHEST 1 VIEW COMPARISON:  February 01, 2019. FINDINGS: Suspected small left pleural effusion. No visible pneumothorax on this semi erect radiograph. Overlying chronic left basilar opacities. Similar cardiomediastinal silhouette with cardiac valve replacement. Polyarticular degenerative change. IMPRESSION: Suspected small left pleural effusion. Overlying chronic left basilar opacities, probably atelectasis/scar. Infection is not excluded. Electronically Signed   By: Margaretha Sheffield M.D.   On: 11/13/2021 13:43   CT HEAD CODE STROKE WO CONTRAST  Result Date: 11/13/2021 CLINICAL DATA:  Code stroke. Provided history: Neuro deficit, acute, stroke suspected. EXAM: CT HEAD WITHOUT CONTRAST TECHNIQUE: Contiguous axial images were obtained from the base of the skull through the vertex without intravenous contrast. COMPARISON:  Brain MRI 06/29/2020.  Head CT 02/01/2019 FINDINGS: Brain: Mild for age generalized cerebral and cerebellar atrophy. Moderately advanced patchy and ill-defined hypoattenuation within the cerebral white matter, nonspecific but compatible with chronic small vessel ischemic disease. Redemonstrated chronic lacunar infarct within the left caudate head. There is no acute intracranial hemorrhage. No demarcated cortical infarct. No extra-axial fluid collection. No evidence of an intracranial mass.  No midline shift. Vascular: No hyperdense vessel.  Atherosclerotic calcifications. Skull: Normal. Negative for fracture or focal lesion. Sinuses/Orbits: Visualized orbits show no acute finding. Mild mucosal thickening within the right frontoethmoidal recess. ASPECTS ([place] Stroke Program Early CT Score) - Ganglionic level infarction (caudate, lentiform nuclei, internal capsule, insula, M1-M3 cortex): 7 - Supraganglionic infarction (M4-M6 cortex): 3 Total score (0-10 with 10 being normal): 10 These results were communicated to Dr. Cheral Marker at 1:14 pmon 1/9/2023by text page via the [facility] messaging system. IMPRESSION: No evidence of acute intracranial abnormality. Moderately severe chronic small vessel ischemic changes within the cerebral white matter. Redemonstrated chronic lacunar infarct within the left caudate head. Mild generalized parenchymal atrophy. Mild mucosal thickening within the right frontoethmoidal recess. Electronically Signed   By: Kellie Simmering D.O.   On: 11/13/2021 13:15    Procedures Procedures    Medications Ordered in ED Medications  clevidipine (CLEVIPREX) infusion 0.5 mg/mL (2 mg/hr Intravenous Rate/Dose Verify 11/13/21 1323)    ED Course/ Medical Decision Making/ A&P                           Medical Decision Making   Medical Screen Complete  This patient presented to the ED with complaint of AMS.  This complaint involves an extensive number of treatment options. The initial differential diagnosis includes, but is not limited to, AMS, CVA, Hypertensive Urgency  This presentation is: Acute, Self-Limited, Previously Undiagnosed, Uncertain Prognosis, Complicated, Systemic Symptoms, and Threat to Life/Bodily Function  Patient presented with transient episode of AMS.  This was associated with significant elevation of blood pressure.  Patient seen by neuro team on arrival as a code stroke.  No indication for acute neuro intervention per Dr. Cheral Marker.  Patient started on  Cleviprex drip by Neurology for treatment of blood pressure.  Neurology will follow as consult - however, they recommended overnight observation and close management of the patient's blood pressure.  Will also obtain EEG.  CCM contacted regarding case (patient on Cleviprex gtt) - they will consult. Dr. Francis Gaines of Hospitalist service also aware of case.       Co morbidities that complicated the patient's evaluation  Aortic stenosis status post TAVR, CAD, hypertension   Additional history obtained:  Additional history obtained from Family.  Patient's daughter is at bedside.  She describes the events of the morning. External records from outside sources obtained  and reviewed including prior ED visits and prior Inpatient records.    Lab Tests:  I ordered and personally interpreted labs.  The pertinent results include: CBC, CMP, i-STAT, UA, COVID and flu testing.   Imaging Studies ordered:  I ordered imaging studies including CT head and chest x-ray I independently visualized and interpreted obtained imaging which showed no acute pathology I agree with the radiologist interpretation.   Cardiac Monitoring:  The patient was maintained on a cardiac monitor.  I personally viewed and interpreted the cardiac monitor which showed an underlying rhythm of: Sinus rhythm   Medicines ordered:  Cleviprex drip initiated by neuro team. Reevaluation of the patient after these medicines showed that the patient: improved       Consultations Obtained:  Patient seen as a code stroke.  Neuro team involved from patient arrival to the ED.  Neuro team is requesting overnight observation.  Patient needs close management of BP.  Patient will also require EEG.   Problem List / ED Course:  AMS, transient; hypertension   Reevaluation:  After the interventions noted above, I reevaluated the patient and found that they have: improved    Dispostion:  After consideration of the  diagnostic results and the patients response to treatment, I feel that the patent would benefit from admission.   CRITICAL CARE Performed by: Valarie Merino   Total critical care time: 30 minutes  Critical care time was exclusive of separately billable procedures and treating other patients.  Critical care was necessary to treat or prevent imminent or life-threatening deterioration.  Critical care was time spent personally by me on the following activities: development of treatment plan with patient and/or surrogate as well as nursing, discussions with consultants, evaluation of patient's response to treatment, examination of patient, obtaining history from patient or surrogate, ordering and performing treatments and interventions, ordering and review of laboratory studies, ordering and review of radiographic studies, pulse oximetry and re-evaluation of patient's condition.             Final Clinical Impression(s) / ED Diagnoses Final diagnoses:  Altered mental status, unspecified altered mental status type  Hypertension, unspecified type    Rx / DC Orders ED Discharge Orders     None         Valarie Merino, MD 11/14/21 [phone]

## 2021-11-13 NOTE — Procedures (Signed)
Patient Name: Amy Kaiser  MRN: [id number]  Epilepsy Attending: Lora Havens  Referring Physician/Provider: Dr Janene Madeira Date: 11/13/2021 Duration: 26.01 mins  Patient history:  88 year old female presenting with AMS. EEG to evaluate fir seizure  Level of alertness: Awake  AEDs during EEG study: None  Technical aspects: This EEG study was done with scalp electrodes positioned according to the 10-20 International system of electrode placement. Electrical activity was acquired at a sampling rate of 500Hz  and reviewed with a high frequency filter of 70Hz  and a low frequency filter of 1Hz . EEG data were recorded continuously and digitally stored.   Description: The posterior dominant rhythm consists of 8-9 Hz activity of moderate voltage (25-35 uV) seen predominantly in posterior head regions, symmetric and reactive to eye opening and eye closing. Hyperventilation and photic stimulation were not performed.     IMPRESSION: This study is within normal limits. No seizures or epileptiform discharges were seen throughout the recording.  Nikalas Bramel [name]

## 2021-11-13 NOTE — H&P (Signed)
History and Physical    Amy Kaiser BWL:[id number] DOB: [birth year] DOA: 11/13/2021  PCP: Harlan Stains, MD Consultants:  cardiology: Dr. Rockey Situ, neurology: DR. Delice Lesch  Patient coming from:  Home - lives alone   Chief Complaint: AMS  HPI: Amy Kaiser is a 88 y.o. female with medical history significant of  HTN, HLD, COPD, depression and anxiety, HLD, CAD, diastolic CHF, aortic stenosis s/p TAVR with Sapien 3 valve, chronic UTIs on daily prophylaxis and Alzheimer's dementia with hallucinations who presented to ED with confusion and decreased verbalization. She doesn't remember much except this morning she became shaky and couldn't function good. She also had some vision changes during her phone calls with her daughter. She describes it as a shadow on her eyes.  rest of history from her daughter.  Her daughter talked to her around 10:00-10:30 am and she was her normal self. About 30 minutes later they heard an ambulance and then her mom called her about the ambulance. She kept telling her daughter she couldn't hear her and hung up on her. She called her back asking the same questions and then hung up on her. Her daughter called her back and at this point her mom couldn't really talk so she went over to her house. When she got there she was laying in her bed and repeating the same phrase, "im fine." She kept licking her lips and called 911. She then asked her to smile and lift her arms up which she wouldn't do, she wouldn't talk. She wouldn't follow commands. EMS vitals: 211/102, HR: 100 and initiated code stroke.    Baseline: She does not drive or do her finances. Her family gives her her medication. She does cook. She is independent with her ADLs.   She has been very depressed over the holidays and is still sad as she lost 2 of her sons and holidays are hard for her. She also has lost 2 of her best friend's recently as well.  She has also been stressed about some siblings who have been ill.  She denies any fever/chills, no chest pain or palpitations, no shortness of breath or cough, no stomach pain, N/V/D, some dysuria that is chronic, no leg swelling.   She had a son diagnosed with parkinsons who committed suicide and patient's grandfather had PD.   ED Course: vitals: bp on arrival: 222/86, afebrile, HR: 92, RR: 15, oxygen: 96% RA Pertinent labs: hgb: 11.4 (10-10.7),  CXR: suspected small left pleural effusion. Overlying chronic left basilar opacities, likely atelectasis/scar. Infection not excluded. Last CXR in 2017 and new  CTH: no acute infarct.  In ED: code stroke called and neurology consulted. Patient was started on clevidipine drip and ICU originally called to admit patient; however, she was weaned off drip and PPCM felt like she was stable to go to floor. TRH was asked to admit.   Review of Systems: As per HPI; otherwise review of systems reviewed and negative.   Ambulatory Status:  Ambulates with cane   Past Medical History:  Diagnosis Date   Anemia    Anxiety    Arthritis    "just about all over" (03/11/2017)   Asthma    Cervical cancer ([place])    Chronic UTI    COPD (chronic obstructive pulmonary disease) (HCC)    Coronary artery disease    Depression    Heart murmur    Hip fracture (HCC)    left hip   Hyperlipidemia    Low blood sugar  Migraine    "work related; quit when I quit work in ~ 1997" (03/11/2017)   PONV (postoperative nausea and vomiting)    Skin cancer    back    Past Surgical History:  Procedure Laterality Date   APPENDECTOMY     CARDIAC CATHETERIZATION  02/2017   CATARACT EXTRACTION W/ INTRAOCULAR LENS  IMPLANT, BILATERAL Bilateral    CORONARY ANGIOPLASTY WITH STENT PLACEMENT  03/11/2017   CORONARY STENT INTERVENTION N/A 03/11/2017   Procedure: Coronary Stent Intervention;  Surgeon: Sherren Mocha, MD;  Location: [facility];  Service: Cardiovascular;  Laterality: N/A;   IR RADIOLOGY PERIPHERAL GUIDED IV START  02/26/2017   IR US  GUIDE VASC ACCESS RIGHT  02/26/2017   KNEE ARTHROSCOPY Right 03/09/2021   Procedure: RIGHT KNEE ARTHROSCOPY WITH PARTIAL MEDIAL AND LATERAL MENISCECTOMIES;  Surgeon: [name], MD;  Location: [facility];  Service: Orthopedics;  Laterality: Right;   RIGHT/LEFT HEART CATH AND CORONARY ANGIOGRAPHY N/A 02/20/2017   Procedure: Right/Left Heart Cath and Coronary Angiography;  Surgeon: Sherren Mocha, MD;  Location: [facility];  Service: Cardiovascular;  Laterality: N/A;   SKIN CANCER EXCISION     "back"   TEE WITHOUT CARDIOVERSION N/A 04/09/2017   Procedure: TRANSESOPHAGEAL ECHOCARDIOGRAM (TEE);  Surgeon: Sherren Mocha, MD;  Location: [facility];  Service: Open Heart Surgery;  Laterality: N/A;   TRANSCATHETER AORTIC VALVE REPLACEMENT, TRANSFEMORAL N/A 04/09/2017   Procedure: TRANSCATHETER AORTIC VALVE REPLACEMENT, TRANSFEMORAL;  Surgeon: Sherren Mocha, MD;  Location: [facility];  Service: Open Heart Surgery;  Laterality: N/A;   VAGINAL HYSTERECTOMY      Social History   Socioeconomic History   Marital status: Widowed    Spouse name: Not on file   Number of children: 3   Years of education: Not on file   Highest education level: Not on file  Occupational History   Not on file  Tobacco Use   Smoking status: Never   Smokeless tobacco: Never  Vaping Use   Vaping Use: Never used  Substance and Sexual Activity   Alcohol use: No   Drug use: No   Sexual activity: Not Currently  Other Topics Concern   Not on file  Social History Narrative   Right handed    Lives alone   Social Determinants of Health   Financial Resource Strain: Not on file  Food Insecurity: Not on file  Transportation Needs: Not on file  Physical Activity: Not on file  Stress: Not on file  Social Connections: Not on file  Intimate Partner Violence: Not on file    Allergies  Allergen Reactions   Latex Hives, Itching and Other (See Comments)    Caused blisters in her mouth   Lisinopril  Other (See Comments)    Weakness    Penicillins Other (See Comments)    Intolerance to ALL "CILLINS" > UNSPECIFIED REACTIONS  Has patient had a PCN reaction causing immediate rash, facial/tongue/throat swelling, SOB or lightheadedness with hypotension: UNSPECIFIED REACTION  Has patient had a PCN reaction causing severe rash involving mucus membranes or skin necrosis: UNSPECIFIED REACTION  Has patient had a PCN reaction that required hospitalization UNSPECIFIED REACTION  Has patient had a PCN reaction occurring within the last 10 years: UNSPECIFIED REACTION    Pravachol [Pravastatin Sodium] Other (See Comments)    MYALGIAs LEG PAIN    Alendronate Sodium Other (See Comments)    UNSPECIFIED REACTION  [Patient denies this allergy]    Amoxicillin Other (See Comments)  Doesn't recall   Crestor [Rosuvastatin] Other (See Comments)    High doses - Myalgia   Other Other (See Comments)    Darvocet - unknown   Sulfamethoxazole-Trimethoprim Other (See Comments)    Pt denies    Budesonide-Formoterol Fumarate Anxiety and Other (See Comments)    "SHAKY"    Bupropion Anxiety and Other (See Comments)    "JITTERY"    Codeine Nausea And Vomiting and Other (See Comments)   Morphine And Related Nausea And Vomiting   Prozac [Fluoxetine Hcl] Anxiety    "FEELS SHAKY"     No family history on file.  Prior to Admission medications   Medication Sig Start Date End Date Taking? Authorizing Provider  aspirin (ASPIRIN 81) 81 MG EC tablet Take 162 mg by mouth daily. Swallow whole.    [provider]  calcium-vitamin D (OSCAL WITH D) 500-200 MG-UNIT tablet Take 1 tablet by mouth 2 (two) times daily.    [provider]  Cholecalciferol (VITAMIN D3) 1000 units CAPS Take 1,000 Units by mouth daily with lunch.     [provider]  ferrous sulfate 324 MG TBEC Take 324 mg by mouth daily with breakfast.    [provider]  furosemide (LASIX) 20 MG tablet Take 1 tablet (20  mg total) by mouth every Monday, Wednesday, and Friday. 12/23/20 10/02/21  [name], MD  metoprolol tartrate (LOPRESSOR) 25 MG tablet Take 1/2 tablet (12.5 mg) by mouth twice a day 11/17/19   [name], MD  Multiple Vitamins-Minerals (ICAPS AREDS 2 PO) Take 1 capsule by mouth 2 (two) times daily.    [provider]  nitrofurantoin (MACRODANTIN) 100 MG capsule Take 100 mg by mouth daily.    [provider]  nitroGLYCERIN (NITROSTAT) 0.4 MG SL tablet DISSOLVE ONE TABLET UNDER THE TONGUE EVERY 5 MINUTES AS NEEDED FOR CHEST PAIN.  DO NOT EXCEED A TOTAL OF 3 DOSES IN 15 MINUTES 08/18/21   [name], [name], MD  ondansetron (ZOFRAN) 4 MG tablet Take 1 tablet (4 mg total) by mouth every 8 (eight) hours as needed for nausea or vomiting. 03/09/21   [name], MD  rosuvastatin (CRESTOR) 20 MG tablet Take 20 mg by mouth daily. 07/02/19   [provider]  traMADol (ULTRAM) 50 MG tablet TAKE 1 TO 2 TABLETS BY MOUTH EVERY 6 HOURS AS NEEDED 03/09/21   [name], MD  vitamin B-12 (CYANOCOBALAMIN) 500 MCG tablet Take 1 tablet (500 mcg total) by mouth daily. 02/02/19   Mendel Corning, MD    Physical Exam: Vitals:   11/13/21 2000 11/13/21 2015 11/13/21 2045 11/13/21 2100  BP: (!) 177/68 (!) 170/57 (!) 182/58 (!) 157/60  Pulse: 76 75 87 78  Resp: 16 18 (!) 27 (!) 23  Temp:      TempSrc:      SpO2: 96% 94% 90% 92%  Weight:      Height:         General:  Appears calm and comfortable and is in NAD Eyes:  PERRL, EOMI, normal lids, iris ENT:  grossly normal hearing, lips & tongue, mmm; appropriate dentition Neck:  no LAD, masses or thyromegaly; no carotid bruits, no jvd  Cardiovascular:  RRR, no m/r/g. No LE edema.  Respiratory:   CTA bilaterally with no wheezes/rales/rhonchi.  Normal respiratory effort. Abdomen:  soft, NT, ND, NABS Back:   normal alignment, no CVAT Skin:  no rash or induration seen on limited exam Musculoskeletal:  grossly  normal tone BUE, about 4/5 in BLE. good ROM, no bony abnormality Lower extremity:  No LE edema.  Limited foot exam with no ulcerations.  2+ distal pulses. Psychiatric:  grossly normal mood and affect, speech fluent and appropriate, AOx3 Neurologic:  CN 2-12 grossly intact, moves all extremities in coordinated fashion, sensation intact. FTN intact bilaterally. Negative pronator drift.     Radiological Exams on Admission: Independently reviewed - see discussion in A/P where applicable  MR BRAIN WO CONTRAST  Result Date: 11/13/2021 CLINICAL DATA:  Initial evaluation for acute altered mental status. EXAM: MRI HEAD WITHOUT CONTRAST TECHNIQUE: Multiplanar, multiecho pulse sequences of the brain and surrounding structures were obtained without intravenous contrast. COMPARISON:  CT from earlier the same day. FINDINGS: Brain: Cerebral volume within normal limits. Patchy and confluent T2/FLAIR hyperintensity involving the periventricular deep white matter both cerebral hemispheres as well as the pons, most consistent with chronic small vessel ischemic disease, moderate in nature. Small remote lacunar infarct present at the right thalamus. No abnormal foci of restricted diffusion to suggest acute or subacute ischemia. Gray-white matter differentiation maintained. No encephalomalacia to suggest chronic cortical infarction. No acute intracranial hemorrhage. Few scattered chronic micro hemorrhages noted, most pronounced about the cerebellum, likely related to chronic underlying hypertension. No mass lesion, midline shift or mass effect. No hydrocephalus or extra-axial fluid collection. Pituitary gland suprasellar region normal. Midline structures intact. Vascular: Major intracranial vascular flow voids are maintained. Skull and upper cervical spine: Craniocervical junction within normal limits. Bone marrow signal intensity normal. No scalp soft tissue abnormality. Sinuses/Orbits: Patient status post bilateral ocular  lens replacement. Mild scattered mucosal thickening noted within the ethmoidal air cells. Paranasal sinuses are otherwise clear. No mastoid effusion. Inner ear structures grossly normal. Other: None. IMPRESSION: 1. No acute intracranial abnormality. 2. Moderate chronic microvascular ischemic disease. Electronically Signed   By: Jeannine Boga M.D.   On: 11/13/2021 19:56   DG Chest Port 1 View  Result Date: 11/13/2021 CLINICAL DATA:  weakness EXAM: PORTABLE CHEST 1 VIEW COMPARISON:  February 01, 2019. FINDINGS: Suspected small left pleural effusion. No visible pneumothorax on this semi erect radiograph. Overlying chronic left basilar opacities. Similar cardiomediastinal silhouette with cardiac valve replacement. Polyarticular degenerative change. IMPRESSION: Suspected small left pleural effusion. Overlying chronic left basilar opacities, probably atelectasis/scar. Infection is not excluded. Electronically Signed   By: Margaretha Sheffield M.D.   On: 11/13/2021 13:43   EEG adult  Result Date: 11/13/2021 Lora Havens, MD     11/13/2021  5:51 PM Patient Name: BERENISE HUNTON MRN: [id number] Epilepsy Attending: Lora Havens Referring Physician/Provider: Dr Janene Madeira Date: 11/13/2021 Duration: 26.01 mins Patient history:  88 year old female presenting with AMS. EEG to evaluate fir seizure Level of alertness: Awake AEDs during EEG study: None Technical aspects: This EEG study was done with scalp electrodes positioned according to the 10-20 International system of electrode placement. Electrical activity was acquired at a sampling rate of 500Hz  and reviewed with a high frequency filter of 70Hz  and a low frequency filter of 1Hz . EEG data were recorded continuously and digitally stored. Description: The posterior dominant rhythm consists of 8-9 Hz activity of moderate voltage (25-35 uV) seen predominantly in posterior head regions, symmetric and reactive to eye opening and eye closing. Hyperventilation and  photic stimulation were not performed.   IMPRESSION: This study is within normal limits. No seizures or epileptiform discharges were seen throughout the recording. [place]   CT HEAD CODE STROKE WO CONTRAST  Result Date: 11/13/2021 CLINICAL DATA:  Code stroke. Provided history: Neuro deficit, acute, stroke suspected. EXAM: CT HEAD WITHOUT CONTRAST TECHNIQUE: Contiguous axial images were obtained from the base of the skull through the vertex without intravenous contrast. COMPARISON:  Brain MRI 06/29/2020.  Head CT 02/01/2019 FINDINGS: Brain: Mild for age generalized cerebral and cerebellar atrophy. Moderately advanced patchy and ill-defined hypoattenuation within the cerebral white matter, nonspecific but compatible with chronic small vessel ischemic disease. Redemonstrated chronic lacunar infarct within the left caudate head. There is no acute intracranial hemorrhage. No demarcated cortical infarct. No extra-axial fluid collection. No evidence of an intracranial mass. No midline shift. Vascular: No hyperdense vessel.  Atherosclerotic calcifications. Skull: Normal. Negative for fracture or focal lesion. Sinuses/Orbits: Visualized orbits show no acute finding. Mild mucosal thickening within the right frontoethmoidal recess. ASPECTS (Gage Stroke Program Early CT Score) - Ganglionic level infarction (caudate, lentiform nuclei, internal capsule, insula, M1-M3 cortex): 7 - Supraganglionic infarction (M4-M6 cortex): 3 Total score (0-10 with 10 being normal): 10 These results were communicated to Dr. Cheral Marker at 1:14 pmon 1/9/2023by text page via the [facility] messaging system. IMPRESSION: No evidence of acute intracranial abnormality. Moderately severe chronic small vessel ischemic changes within the cerebral white matter. Redemonstrated chronic lacunar infarct within the left caudate head. Mild generalized parenchymal atrophy. Mild mucosal thickening within the right frontoethmoidal recess. Electronically Signed    By: Kellie Simmering D.O.   On: 11/13/2021 13:15    EKG: Independently reviewed.  Sinus tachycardia with rate 102; nonspecific ST changes with no evidence of acute ischemia   Labs on Admission: I have personally reviewed the available labs and imaging studies at the time of the admission.  Pertinent labs:  hgb: 11.4 (10-10.7),     Assessment/Plan * Encephalopathy acute- (present on admission) 88 year old female presenting with confusion, inability to follow commands or talk that has resolved with bp control -admit to progressive -encephalopathy had resolved before I saw her. Hypertensive encephalopathy vs. TIA vs. Seizure -do not suspect infectious source, will check TSH/B12 -neurology following. MRI brain/EEG, f/u on other recs.  -frequent neuro checks -monitor on tele and aim for blood pressure goal of 150-160   Hypertensive emergency- (present on admission) Presenting with encephalopathy with blood pressure of 222/86 clevidipine drip started in ED and patient weaned off Home blood pressure medication started by pccm Goal blood pressures 150-160 over next 6 hours then less than 160  Prn hydralazine parameters  Telemetry  Has been taking medication at home per daughter, may need to get pill box since they do not actually give them to her to take in front of them with her memory loss.   Coronary artery disease with exertional angina ([place])- (present on admission) severe stenosis of the proximal RCA  stenting with a 4 mm drug-eluting stent 03/11/2017.  Stable with no chest pain or changes on ekg with hypertensive emergency Continue telemetry, lopressor and crestor and ASA   Chronic diastolic CHF (congestive heart failure) ([place])- (present on admission) Euvolemic on exam today  Echo: 2/22-EF of 65-70% with normal LVF. Grade 1 DD. Also has moderate to severe mitral regurgitation  Monitor I/O Continue lasix three times a week and lopressor   severe aortic stenosis S/P TAVR  (transcatheter aortic valve replacement) Stable by echo in 12/2020  Alzheimer's disease ([place])- (present on admission) Stable, on no medication. Followed by neurology Delirium precautions   Iron deficiency anemia- (present on admission) Baseline 10-10.7 Continue to monitor      Body mass  index is 26.47 kg/m.    Level of care: Progressive DVT prophylaxis:  Lovenox  Code Status:  Full - confirmed with patient/family Family Communication: None present; I spoke with the patient's daughter by telephone at the time of admission. Lorelle Gibbs: [phone] Disposition Plan:  The patient is from: home  Anticipated d/c is to: per day team  Patient placed in observation as anticipate less than 2 midnight stay. Requires hospitalization due to HTN emergency/encephalopathy requiring IV medication, close observation, testing and MDM with specialists and is at risk of neurologically worsening and is not safe to return home.    Patient is currently: stable Consults called: neurology: Dr. Cheral Marker, PCCM  Admission status:  observation    Orma Flaming MD [facility]   How to contact the [facility] Attending or Consulting provider [place] or covering provider during after hours [place], for this patient?  Check the care team in [facility] and look for a) attending/consulting [facility] provider listed and b) the [facility] team listed Log into [email or web address] and use [facility]'s universal password to access. If you do not have the password, please contact the hospital operator. Locate the [facility] provider you are looking for under [facility] and page to a number that you can be directly reached. If you still have difficulty reaching the provider, please page the [facility] (Director on Call) for the Hospitalists listed on amion for assistance.   11/13/2021, 9:34 PM

## 2021-11-13 NOTE — ED Notes (Signed)
NP provider at bedside. Per NP, pt to be weaned off of the Cleviprex and to decrease the rate down to 1mg /hr (previously 2mg ).

## 2021-11-13 NOTE — Assessment & Plan Note (Addendum)
88 year old female presenting with confusion, inability to follow commands or talk that has resolved with bp control -admit to progressive -encephalopathy had resolved before I saw her. Hypertensive encephalopathy vs. TIA vs. Seizure -do not suspect infectious source, will check TSH/B12 -neurology following. MRI brain/EEG, f/u on other recs.  -frequent neuro checks -monitor on tele and aim for blood pressure goal of 150-160

## 2021-11-13 NOTE — Discharge Instructions (Addendum)
Follow with Primary MD Harlan Stains, MD in 7 days   Get CBC, CMP, 2 view Chest X ray -  checked next visit within 1 week by Primary MD     Activity: As tolerated with Full fall precautions use walker/cane & assistance as needed  Disposition Home    Diet: Heart Healthy    Special Instructions: If you have smoked or chewed Tobacco  in the last 2 yrs please stop smoking, stop any regular Alcohol  and or any Recreational drug use.  On your next visit with your primary care physician please Get Medicines reviewed and adjusted.  Please request your Prim.MD to go over all Hospital Tests and Procedure/Radiological results at the follow up, please get all Hospital records sent to your Prim MD by signing hospital release before you go home.  If you experience worsening of your admission symptoms, develop shortness of breath, life threatening emergency, suicidal or homicidal thoughts you must seek medical attention immediately by calling 911 or calling your MD immediately  if symptoms less severe.  You Must read complete instructions/literature along with all the possible adverse reactions/side effects for all the Medicines you take and that have been prescribed to you. Take any new Medicines after you have completely understood and accpet all the possible adverse reactions/side effects.

## 2021-11-13 NOTE — ED Triage Notes (Signed)
Pt bib [facility] from home where she lives alone and is normally able to care for self. Pt daughter called EMS due to pt calling her twice this am at 1145 repeating herself. Upon EMS arrival, pt was laying in bed and not following commands. CODE STROKE called. Upon arrival to ED pt has hypertension and NIH 4 EMS vitals: 211/102, 100HR  Pt states she did not take her medications this am because she was still in bed. Pt states she is supposed to be on antibiotics for UTI

## 2021-11-13 NOTE — Assessment & Plan Note (Signed)
Baseline 10-10.7 Continue to monitor

## 2021-11-13 NOTE — Consult Note (Signed)
NAME:  Amy Kaiser, MRN:  [id number], DOB:  [birth year], LOS: 0 ADMISSION DATE:  11/13/2021, CONSULTATION DATE:  1/9 REFERRING MD:  Dr. Francia Greaves EDP, CHIEF COMPLAINT:  Hypertension, AMS   History of Present Illness:  88 year old female with PMH as below, which is significant for HTN, frequent UTI, aortic stenosis s/p TAVR with Sapien 3 valve, and Alzheimer's dementia with hallucinations. She was in her usual state of health in the AM hours of 1/9 and had spoken to her daughter several times on the phone, then during another phone call she was unable to communicate with her daughter who then went across the street to check on her. She was confused and repeating "I'm fine". Upon EMS arrival she was not following commands. She was transported to the ED at [facility] as a code stroke. BP on arrival 222/86. She was taken emergently for CT head which did not describe acute cause of deficits. She was started on clevidipine infusion and with BP improvement her symptoms resolved. PCCM was asked to further evaluate.   Pertinent  Medical History   has a past medical history of Anemia, Anxiety, Arthritis, Asthma, Cervical cancer ([place]), Chronic UTI, COPD (chronic obstructive pulmonary disease) ([place]), Coronary artery disease, Depression, Heart murmur, Hip fracture ([place]), Hyperlipidemia, Low blood sugar, Migraine, PONV (postoperative nausea and vomiting), and Skin cancer.   Significant Hospital Events: Including procedures, antibiotic start and stop dates in addition to other pertinent events     Interim History / Subjective:    Objective   Blood pressure (!) 146/55, pulse 95, temperature 98.8 F (37.1 C), temperature source Oral, resp. rate 12, height 5\' 6"  (1.676 m), weight 74.4 kg, SpO2 95 %.       No intake or output data in the 24 hours ending 11/13/21 1544 Filed Weights   11/13/21 1255  Weight: 74.4 kg    Examination: General: Frail elderly appearing female in NAD HENT: [place]/AT, PERRL, no JVD,  no bruit Lungs: Clear bilateral breath sounds Cardiovascular: RRR, no MRG Abdomen: Soft, non-tender, non-distended Extremities: No acute deformity or ROM limitation Neuro: Alert, oriented, non-focal  Resolved Hospital Problem list     Assessment & Plan:   Acute encephalopathy: hypertensive encephalopathy vs TIA - BP control as below - MRI brain. Aortic valve is Sapien 3 which is MRI conditional. Can be safely done Defer final decision to radiology department  Hypertensive crisis: SBP 222 upon arrival to ED. Started on Clevidipine infusion. He was only on 2 mg/hr and BP goal was overshot - SBP goal 160-170 which is a 25 % reduction from 222. Will target this for 6 hours, then target less than 160 - Give metoprolol home dose now - PRN hydralazine - Wean clevidipine to off - Admit to the hospitalist service for telemetry monitoring   Labs   CBC: Recent Labs  Lab 11/13/21 1255 11/13/21 1258  WBC 9.9  --   NEUTROABS 5.3  --   HGB 11.4* 11.9*  HCT 35.7* 35.0*  MCV 96.5  --   PLT 236  --     Basic Metabolic Panel: Recent Labs  Lab 11/13/21 1255 11/13/21 1258  NA 139 141  K 4.2 4.1  CL 105 103  CO2 25  --   GLUCOSE 96 97  BUN 14 15  CREATININE 0.93 0.90  CALCIUM 8.8*  --    GFR: Estimated Creatinine Clearance: 42.8 mL/min (by C-G formula based on SCr of 0.9 mg/dL). Recent Labs  Lab 11/13/21 1255  WBC 9.9    Liver Function Tests: Recent Labs  Lab 11/13/21 1255  AST 21  ALT 10  ALKPHOS 77  BILITOT 0.6  PROT 7.7  ALBUMIN 3.5   No results for input(s): LIPASE, AMYLASE in the last 168 hours. No results for input(s): AMMONIA in the last 168 hours.  ABG    Component Value Date/Time   PHART 7.353 04/09/2017 1106   PCO2ART 38.7 04/09/2017 1106   PO2ART 108.0 04/09/2017 1106   HCO3 21.9 04/09/2017 1106   TCO2 26 11/13/2021 1258   ACIDBASEDEF 4.0 (H) 04/09/2017 1106   O2SAT 98.0 04/09/2017 1106     Coagulation Profile: Recent Labs  Lab  11/13/21 1255  INR 1.0    Cardiac Enzymes: No results for input(s): CKTOTAL, CKMB, CKMBINDEX, TROPONINI in the last 168 hours.  HbA1C: Hgb A1c MFr Bld  Date/Time Value Ref Range Status  04/05/2017 01:24 PM 5.7 (H) 4.8 - 5.6 % Final    Comment:    (NOTE)         Pre-diabetes: 5.7 - 6.4         Diabetes: >6.4         Glycemic control for adults with diabetes: <7.0     CBG: Recent Labs  Lab 11/13/21 1250  GLUCAP 102*    Review of Systems:   Bolds are positive  Constitutional: weight loss, gain, night sweats, Fevers, chills, fatigue .  HEENT: headaches, Sore throat, sneezing, nasal congestion, post nasal drip, Difficulty swallowing, Tooth/dental problems, visual complaints visual changes, ear ache CV:  chest pain, radiates:,Orthopnea, PND, swelling in lower extremities, dizziness, palpitations, syncope.  GI  heartburn, indigestion, abdominal pain, nausea, vomiting, diarrhea, change in bowel habits, loss of appetite, bloody stools.  Resp: cough, productive: , hemoptysis, dyspnea, chest pain, pleuritic.  Skin: rash or itching or icterus GU: dysuria, change in color of urine, urgency or frequency. flank pain, hematuria  MS: joint pain or swelling. decreased range of motion  Psych: change in mood or affect. depression or anxiety.  Neuro: difficulty with speech, weakness, numbness, ataxia. Pixelated vision R eye. Upper visual field cut. All resolved now.   Past Medical History:  She,  has a past medical history of Anemia, Anxiety, Arthritis, Asthma, Cervical cancer ([place]), Chronic UTI, COPD (chronic obstructive pulmonary disease) ([place]), Coronary artery disease, Depression, Heart murmur, Hip fracture ([place]), Hyperlipidemia, Low blood sugar, Migraine, PONV (postoperative nausea and vomiting), and Skin cancer.   Surgical History:   Past Surgical History:  Procedure Laterality Date   APPENDECTOMY     CARDIAC CATHETERIZATION  02/2017   CATARACT EXTRACTION W/ INTRAOCULAR LENS   IMPLANT, BILATERAL Bilateral    CORONARY ANGIOPLASTY WITH STENT PLACEMENT  03/11/2017   CORONARY STENT INTERVENTION N/A 03/11/2017   Procedure: Coronary Stent Intervention;  Surgeon: Sherren Mocha, MD;  Location: [place] [facility];  Service: Cardiovascular;  Laterality: N/A;   IR RADIOLOGY PERIPHERAL GUIDED IV START  02/26/2017   IR US GUIDE VASC ACCESS RIGHT  02/26/2017   KNEE ARTHROSCOPY Right 03/09/2021   Procedure: RIGHT KNEE ARTHROSCOPY WITH PARTIAL MEDIAL AND LATERAL MENISCECTOMIES;  Surgeon: [name], MD;  Location: [place];  Service: Orthopedics;  Laterality: Right;   RIGHT/LEFT HEART CATH AND CORONARY ANGIOGRAPHY N/A 02/20/2017   Procedure: Right/Left Heart Cath and Coronary Angiography;  Surgeon: Sherren Mocha, MD;  Location: [place] [facility];  Service: Cardiovascular;  Laterality: N/A;   SKIN CANCER EXCISION     "back"   TEE WITHOUT CARDIOVERSION  N/A 04/09/2017   Procedure: TRANSESOPHAGEAL ECHOCARDIOGRAM (TEE);  Surgeon: Sherren Mocha, MD;  Location: [place];  Service: Open Heart Surgery;  Laterality: N/A;   TRANSCATHETER AORTIC VALVE REPLACEMENT, TRANSFEMORAL N/A 04/09/2017   Procedure: TRANSCATHETER AORTIC VALVE REPLACEMENT, TRANSFEMORAL;  Surgeon: Sherren Mocha, MD;  Location: [place];  Service: Open Heart Surgery;  Laterality: N/A;   VAGINAL HYSTERECTOMY       Social History:   reports that she has never smoked. She has never used smokeless tobacco. She reports that she does not drink alcohol and does not use drugs.   Family History:  Her family history is not on file.   Allergies Allergies  Allergen Reactions   Latex Hives, Itching and Other (See Comments)    Caused blisters in her mouth   Lisinopril Other (See Comments)    Weakness    Penicillins Other (See Comments)    Intolerance to ALL "CILLINS" > UNSPECIFIED REACTIONS  Has patient had a PCN reaction causing immediate rash, facial/tongue/throat swelling, SOB or lightheadedness with  hypotension: UNSPECIFIED REACTION  Has patient had a PCN reaction causing severe rash involving mucus membranes or skin necrosis: UNSPECIFIED REACTION  Has patient had a PCN reaction that required hospitalization UNSPECIFIED REACTION  Has patient had a PCN reaction occurring within the last 10 years: UNSPECIFIED REACTION    Pravachol [Pravastatin Sodium] Other (See Comments)    MYALGIAs LEG PAIN    Alendronate Sodium Other (See Comments)    UNSPECIFIED REACTION  [Patient denies this allergy]    Amoxicillin Other (See Comments)    Doesn't recall   Crestor [Rosuvastatin] Other (See Comments)    High doses - Myalgia   Other Other (See Comments)    Darvocet - unknown   Sulfamethoxazole-Trimethoprim Other (See Comments)    Pt denies    Budesonide-Formoterol Fumarate Anxiety and Other (See Comments)    "SHAKY"    Bupropion Anxiety and Other (See Comments)    "JITTERY"    Codeine Nausea And Vomiting and Other (See Comments)   Morphine And Related Nausea And Vomiting   Prozac [Fluoxetine Hcl] Anxiety    "FEELS SHAKY"      Home Medications  Prior to Admission medications   Medication Sig Start Date End Date Taking? Authorizing Provider  aspirin (ASPIRIN 81) 81 MG EC tablet Take 162 mg by mouth daily. Swallow whole.    [provider]  calcium-vitamin D (OSCAL WITH D) 500-200 MG-UNIT tablet Take 1 tablet by mouth 2 (two) times daily.    [provider]  Cholecalciferol (VITAMIN D3) 1000 units CAPS Take 1,000 Units by mouth daily with lunch.     [provider]  ferrous sulfate 324 MG TBEC Take 324 mg by mouth daily with breakfast.    [provider]  furosemide (LASIX) 20 MG tablet Take 1 tablet (20 mg total) by mouth every Monday, Wednesday, and Friday. 12/23/20 10/02/21  [name], MD  metoprolol tartrate (LOPRESSOR) 25 MG tablet Take 1/2 tablet (12.5 mg) by mouth twice a day 11/17/19   [name], MD  Multiple Vitamins-Minerals  (ICAPS AREDS 2 PO) Take 1 capsule by mouth 2 (two) times daily.    [provider]  nitrofurantoin (MACRODANTIN) 100 MG capsule Take 100 mg by mouth daily.    [provider]  nitroGLYCERIN (NITROSTAT) 0.4 MG SL tablet DISSOLVE ONE TABLET UNDER THE TONGUE EVERY 5 MINUTES AS NEEDED FOR CHEST PAIN.  DO NOT EXCEED A TOTAL OF 3 DOSES IN 15 MINUTES  08/18/21   [name], MD  ondansetron (ZOFRAN) 4 MG tablet Take 1 tablet (4 mg total) by mouth every 8 (eight) hours as needed for nausea or vomiting. 03/09/21   [name], MD  rosuvastatin (CRESTOR) 20 MG tablet Take 20 mg by mouth daily. 07/02/19   [provider]  traMADol (ULTRAM) 50 MG tablet TAKE 1 TO 2 TABLETS BY MOUTH EVERY 6 HOURS AS NEEDED 03/09/21   [name], MD  vitamin B-12 (CYANOCOBALAMIN) 500 MCG tablet Take 1 tablet (500 mcg total) by mouth daily. 02/02/19   Mendel Corning, MD     Critical care time:      Georgann Housekeeper, AGACNP-BC [place] for personal pager PCCM on call pager [phone] until 7pm. Please call Elink 7p-7a. [phone]  11/13/2021 4:52 PM

## 2021-11-13 NOTE — Assessment & Plan Note (Signed)
severe stenosis of the proximal RCA  stenting with a 4 mm drug-eluting stent 03/11/2017.  Stable with no chest pain or changes on ekg with hypertensive emergency Continue telemetry, lopressor and crestor and ASA

## 2021-11-13 NOTE — Progress Notes (Signed)
EEG complete - results pending 

## 2021-11-13 NOTE — ED Notes (Signed)
Amy Kaiser [phone] would like an update

## 2021-11-13 NOTE — Assessment & Plan Note (Addendum)
Presenting with encephalopathy with blood pressure of 222/86 clevidipine drip started in ED and patient weaned off Home blood pressure medication started by pccm Goal blood pressures 150-160 over next 6 hours then less than 160  Prn hydralazine parameters  Telemetry  Has been taking medication at home per daughter, may need to get pill box since they do not actually give them to her to take in front of them with her memory loss.

## 2021-11-13 NOTE — Assessment & Plan Note (Signed)
Stable, on no medication. Followed by neurology Delirium precautions

## 2021-11-13 NOTE — ED Notes (Signed)
NP provider back at bedside. Per NP, after pt receives her dose of metoprolol this RN is to wait 20-30 minutes before turning off IV Cleviprex. NP provider will be back around to come check on pt and will assess her status and BP at that time.

## 2021-11-14 ENCOUNTER — Encounter (HOSPITAL_COMMUNITY): Payer: Self-pay | Admitting: Family Medicine

## 2021-11-14 DIAGNOSIS — G9340 Encephalopathy, unspecified: Secondary | ICD-10-CM

## 2021-11-14 LAB — CBC
HCT: 33.8 % — ABNORMAL LOW (ref 36.0–46.0)
Hemoglobin: 10.4 g/dL — ABNORMAL LOW (ref 12.0–15.0)
MCH: 29.7 pg (ref 26.0–34.0)
MCHC: 30.8 g/dL (ref 30.0–36.0)
MCV: 96.6 fL (ref 80.0–100.0)
Platelets: 218 10*3/uL (ref 150–400)
RBC: 3.5 MIL/uL — ABNORMAL LOW (ref 3.87–5.11)
RDW: 14.1 % (ref 11.5–15.5)
WBC: 8.6 10*3/uL (ref 4.0–10.5)
nRBC: 0 % (ref 0.0–0.2)

## 2021-11-14 LAB — BASIC METABOLIC PANEL WITH GFR
Anion gap: 7 (ref 5–15)
BUN: 17 mg/dL (ref 8–23)
CO2: 25 mmol/L (ref 22–32)
Calcium: 8.7 mg/dL — ABNORMAL LOW (ref 8.9–10.3)
Chloride: 106 mmol/L (ref 98–111)
Creatinine, Ser: 0.96 mg/dL (ref 0.44–1.00)
GFR, Estimated: 56 mL/min — ABNORMAL LOW
Glucose, Bld: 110 mg/dL — ABNORMAL HIGH (ref 70–99)
Potassium: 4 mmol/L (ref 3.5–5.1)
Sodium: 138 mmol/L (ref 135–145)

## 2021-11-14 LAB — TSH: TSH: 2.472 u[IU]/mL (ref 0.350–4.500)

## 2021-11-14 LAB — VITAMIN B12: Vitamin B-12: 723 pg/mL (ref 180–914)

## 2021-11-14 NOTE — Progress Notes (Signed)
°   11/14/21 1010  TOC ED Mini Assessment  TOC Time spent with patient (minutes): 30  TOC Time saved using PING (minutes): 30  PING Used in TOC Assessment Yes  Admission or Readmission Diverted No  What brought you to the Emergency Department?  Stoke symtoms  Barriers to Discharge ED Barriers Resolved  Means of departure Car  Patient states their goals for this hospitalization and ongoing recovery are: Go home   Keera Altidor J. Clydene Laming, RN, BSN, [place] [phone] RNCM spoke with pt at bedside regarding discharge planning for [place]. Offered pt medicare.gov list of home health agencies to choose from.  Pt chose [facility] to render services. Stacie of [facility] notified. Patient made aware that [facility] will be in contact in 24-48 hours.  No DME needs identified at this time.

## 2021-11-14 NOTE — ED Notes (Signed)
Pt ambulated around the room and in hallway without assistance. Pt gait was steady and vitals remained WNL. Pt is agreeable to discharge

## 2021-11-14 NOTE — Care Management Obs Status (Signed)
MEDICARE OBSERVATION STATUS NOTIFICATION   Patient Details  Name: KEMAYA DORNER MRN: [id number] Date of Birth: 1929-10-30   Medicare Observation Status Notification Given:  Yes    Fuller Mandril, RN 11/14/2021, 9:58 AM

## 2021-11-14 NOTE — Discharge Summary (Signed)
Amy Kaiser CLE:[id number] DOB: 1928/11/24 DOA: 11/13/2021  PCP: Amy Stains, MD  Admit date: 11/13/2021  Discharge date: 11/14/2021  Admitted From: Home   Disposition:  Home   Recommendations for Outpatient Follow-up:   Follow up with PCP in 1-2 weeks  PCP Please obtain BMP/CBC, 2 view CXR in 1week,  (see Discharge instructions)   PCP Please follow up on the following pending results:    Home Health: PT, OT, RN if qualifies   Equipment/Devices: as below  Consultations: Neuro Discharge Condition: Stable    CODE STATUS: Full    Diet Recommendation: Heart Healthy     Chief Complaint  Patient presents with   Code Stroke     Brief history of present illness from the day of admission and additional interim summary    Amy Kaiser is a 88 y.o. female with medical history significant of  HTN, HLD, COPD, depression and anxiety, HLD, CAD, diastolic CHF, aortic stenosis s/p TAVR with Sapien 3 valve, chronic UTIs on daily prophylaxis and Alzheimer's dementia with hallucinations who presented to ED with confusion and decreased verbalization.                                                                  Hospital Course   Acute encephalopathy.  Could be due to TIA.  CT head and MRI brain unremarkable along with unremarkable EEG.  She did have a high blood pressure when she arrived to the hospital but that has resolved now and she is on her home medications which is low-dose beta-blocker and diuretic.  She is completely back to baseline, no focal deficits or headache, no confusion at this time.  Discussed with her and her daughter will be discharged home with home PT, OT and RN with outpatient neurology and PCP follow-up.  Continue home dose aspirin and statin for secondary prevention.   Hypertensive emergency  at the time of admission.  She was started on clevidipine drip in the ER for a few hours but then quickly titrated down and now on oral Lopressor for the last 12 hours without any issues.  PCP to continue to monitor.  High blood pressure could have been due to encephalopathy and agitation versus TIA.  CAD.  With proximal RCA stenosis and history of DEA stent placement on 03/11/2017.  No chest pain at this time, continue aspirin, statin and beta-blocker for secondary prevention.  Also has history of aortic stenosis with TAVR procedure in the past.  Unremarkable no acute issues  Recurrent UTIs.  UA stable, continue prophylactic nitrofurantoin at home.  History of early Alzheimer's dementia.  Supportive care.  Oriented x2 currently.  Chronic iron deficiency anemia.  No acute issues.  Discharge diagnosis     Principal Problem:   Encephalopathy acute Active Problems:  Coronary artery disease with exertional angina (HCC)   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)   Chronic diastolic CHF (congestive heart failure) (HCC)   Iron deficiency anemia   Hypertensive emergency   Alzheimer's disease [facility])    Discharge instructions    Discharge Instructions     Diet - low sodium heart healthy   Complete by: As directed    Discharge instructions   Complete by: As directed    Follow with Primary MD Amy Stains, MD in 7 days   Get CBC, CMP, 2 view Chest X ray -  checked next visit within 1 week by Primary MD     Activity: As tolerated with Full fall precautions use walker/cane & assistance as needed  Disposition Home    Diet: Heart Healthy    Special Instructions: If you have smoked or chewed Tobacco  in the last 2 yrs please stop smoking, stop any regular Alcohol  and or any Recreational drug use.  On your next visit with your primary care physician please Get Medicines reviewed and adjusted.  Please request your Prim.MD to go over all Hospital Tests and  Procedure/Radiological results at the follow up, please get all Hospital records sent to your Prim MD by signing hospital release before you go home.  If you experience worsening of your admission symptoms, develop shortness of breath, life threatening emergency, suicidal or homicidal thoughts you must seek medical attention immediately by calling 911 or calling your MD immediately  if symptoms less severe.  You Must read complete instructions/literature along with all the possible adverse reactions/side effects for all the Medicines you take and that have been prescribed to you. Take any new Medicines after you have completely understood and accpet all the possible adverse reactions/side effects.   Increase activity slowly   Complete by: As directed        Discharge Medications   Allergies as of 11/14/2021       Reactions   Latex Hives, Itching, Other (See Comments)   Caused blisters in her mouth   Lisinopril Other (See Comments)   Weakness   Penicillins Other (See Comments)   Intolerance to ALL "CILLINS" > UNSPECIFIED REACTIONS Has patient had a PCN reaction causing immediate rash, facial/tongue/throat swelling, SOB or lightheadedness with hypotension: UNSPECIFIED REACTION  Has patient had a PCN reaction causing severe rash involving mucus membranes or skin necrosis: UNSPECIFIED REACTION  Has patient had a PCN reaction that required hospitalization UNSPECIFIED REACTION  Has patient had a PCN reaction occurring within the last 10 years: UNSPECIFIED REACTION    Pravachol [pravastatin Sodium] Other (See Comments)   MYALGIAs LEG PAIN   Alendronate Sodium Other (See Comments)   UNSPECIFIED REACTION  [Patient denies this allergy]   Amoxicillin Other (See Comments)   Doesn't recall   Crestor [rosuvastatin] Other (See Comments)   High doses - Myalgia   Other Other (See Comments)   Darvocet - unknown   Sulfamethoxazole-trimethoprim Other (See Comments)   Pt denies    Budesonide-formoterol Fumarate Anxiety, Other (See Comments)   "SHAKY"   Bupropion Anxiety, Other (See Comments)   "JITTERY"   Codeine Nausea And Vomiting, Other (See Comments)   Morphine And Related Nausea And Vomiting   Prozac [fluoxetine Hcl] Anxiety   "FEELS SHAKY"        Medication List     STOP taking these medications    traMADol 50 MG tablet Commonly known as: ULTRAM       TAKE these  medications    aspirin 81 MG EC tablet Take 162 mg by mouth daily. Swallow whole.   calcium-vitamin D 500-200 MG-UNIT tablet Commonly known as: OSCAL WITH D Take 1 tablet by mouth 2 (two) times daily.   ferrous sulfate 324 MG Tbec Take 324 mg by mouth 2 (two) times a week.   furosemide 20 MG tablet Commonly known as: LASIX Take 1 tablet (20 mg total) by mouth every Monday, Wednesday, and Friday.   ICAPS AREDS 2 PO Take 1 capsule by mouth 2 (two) times daily.   metoprolol tartrate 25 MG tablet Commonly known as: LOPRESSOR Take 1/2 tablet (12.5 mg) by mouth twice a day   nitrofurantoin 100 MG capsule Commonly known as: MACRODANTIN Take 100 mg by mouth daily.   nitroGLYCERIN 0.4 MG SL tablet Commonly known as: NITROSTAT DISSOLVE ONE TABLET UNDER THE TONGUE EVERY 5 MINUTES AS NEEDED FOR CHEST PAIN.  DO NOT EXCEED A TOTAL OF 3 DOSES IN 15 MINUTES   ondansetron 4 MG tablet Commonly known as: ZOFRAN Take 1 tablet (4 mg total) by mouth every 8 (eight) hours as needed for nausea or vomiting.   rosuvastatin 20 MG tablet Commonly known as: CRESTOR Take 20 mg by mouth at bedtime.   vitamin B-12 500 MCG tablet Commonly known as: CYANOCOBALAMIN Take 1 tablet (500 mcg total) by mouth daily.   Vitamin D3 25 MCG (1000 UT) Caps Take 1,000 Units by mouth daily with lunch.               Durable Medical Equipment  (From admission, onward)           Start     Ordered   11/14/21 0906  For home use only DME Walker rolling  Once       Comments: 5 wheel  Question  Answer Comment  Walker: With 5 Inch Wheels   Patient needs a walker to treat with the following condition Weakness      11/14/21 0906             Follow-up Information     Amy Stains, MD. Schedule an appointment as soon as possible for a visit in 1 week(s).   Specialty: Family Medicine Contact information: [address], [place] [address] [phone]         [place]. Schedule an appointment as soon as possible for a visit in 1 week(s).   Contact information: [address]     [place] [id number] [phone]                Major procedures and Radiology Reports - PLEASE review detailed and final reports thoroughly  -       MR BRAIN WO CONTRAST  Result Date: 11/13/2021 CLINICAL DATA:  Initial evaluation for acute altered mental status. EXAM: MRI HEAD WITHOUT CONTRAST TECHNIQUE: Multiplanar, multiecho pulse sequences of the brain and surrounding structures were obtained without intravenous contrast. COMPARISON:  CT from earlier the same day. FINDINGS: Brain: Cerebral volume within normal limits. Patchy and confluent T2/FLAIR hyperintensity involving the periventricular deep white matter both cerebral hemispheres as well as the pons, most consistent with chronic small vessel ischemic disease, moderate in nature. Small remote lacunar infarct present at the right thalamus. No abnormal foci of restricted diffusion to suggest acute or subacute ischemia. Gray-white matter differentiation maintained. No encephalomalacia to suggest chronic cortical infarction. No acute intracranial hemorrhage. Few scattered chronic micro hemorrhages noted, most pronounced about the  cerebellum, likely related to chronic underlying hypertension. No mass lesion, midline shift or mass effect. No hydrocephalus or extra-axial fluid collection. Pituitary gland suprasellar region normal. Midline structures intact. Vascular: Major  intracranial vascular flow voids are maintained. Skull and upper cervical spine: Craniocervical junction within normal limits. Bone marrow signal intensity normal. No scalp soft tissue abnormality. Sinuses/Orbits: Patient status post bilateral ocular lens replacement. Mild scattered mucosal thickening noted within the ethmoidal air cells. Paranasal sinuses are otherwise clear. No mastoid effusion. Inner ear structures grossly normal. Other: None. IMPRESSION: 1. No acute intracranial abnormality. 2. Moderate chronic microvascular ischemic disease. Electronically Signed   By: Jeannine Boga M.D.   On: 11/13/2021 19:56   DG Chest Port 1 View  Result Date: 11/13/2021 CLINICAL DATA:  weakness EXAM: PORTABLE CHEST 1 VIEW COMPARISON:  February 01, 2019. FINDINGS: Suspected small left pleural effusion. No visible pneumothorax on this semi erect radiograph. Overlying chronic left basilar opacities. Similar cardiomediastinal silhouette with cardiac valve replacement. Polyarticular degenerative change. IMPRESSION: Suspected small left pleural effusion. Overlying chronic left basilar opacities, probably atelectasis/scar. Infection is not excluded. Electronically Signed   By: Margaretha Sheffield M.D.   On: 11/13/2021 13:43   EEG adult  Result Date: 11/13/2021 Lora Havens, MD     11/13/2021  5:51 PM Patient Name: Amy Kaiser MRN: [id number] Epilepsy Attending: Lora Havens Referring Physician/Provider: Dr Janene Madeira Date: 11/13/2021 Duration: 26.01 mins Patient history:  88 year old female presenting with AMS. EEG to evaluate fir seizure Level of alertness: Awake AEDs during EEG study: None Technical aspects: This EEG study was done with scalp electrodes positioned according to the 10-20 International system of electrode placement. Electrical activity was acquired at a sampling rate of 500Hz  and reviewed with a high frequency filter of 70Hz  and a low frequency filter of 1Hz . EEG data were recorded continuously  and digitally stored. Description: The posterior dominant rhythm consists of 8-9 Hz activity of moderate voltage (25-35 uV) seen predominantly in posterior head regions, symmetric and reactive to eye opening and eye closing. Hyperventilation and photic stimulation were not performed.   IMPRESSION: This study is within normal limits. No seizures or epileptiform discharges were seen throughout the recording. Lora Havens   CT HEAD CODE STROKE WO CONTRAST  Result Date: 11/13/2021 CLINICAL DATA:  Code stroke. Provided history: Neuro deficit, acute, stroke suspected. EXAM: CT HEAD WITHOUT CONTRAST TECHNIQUE: Contiguous axial images were obtained from the base of the skull through the vertex without intravenous contrast. COMPARISON:  Brain MRI 06/29/2020.  Head CT 02/01/2019 FINDINGS: Brain: Mild for age generalized cerebral and cerebellar atrophy. Moderately advanced patchy and ill-defined hypoattenuation within the cerebral white matter, nonspecific but compatible with chronic small vessel ischemic disease. Redemonstrated chronic lacunar infarct within the left caudate head. There is no acute intracranial hemorrhage. No demarcated cortical infarct. No extra-axial fluid collection. No evidence of an intracranial mass. No midline shift. Vascular: No hyperdense vessel.  Atherosclerotic calcifications. Skull: Normal. Negative for fracture or focal lesion. Sinuses/Orbits: Visualized orbits show no acute finding. Mild mucosal thickening within the right frontoethmoidal recess. ASPECTS ([place] Stroke Program Early CT Score) - Ganglionic level infarction (caudate, lentiform nuclei, internal capsule, insula, M1-M3 cortex): 7 - Supraganglionic infarction (M4-M6 cortex): 3 Total score (0-10 with 10 being normal): 10 These results were communicated to Dr. Cheral Marker at 1:14 pmon 1/9/2023by text page via the [facility] messaging system. IMPRESSION: No evidence of acute intracranial abnormality. Moderately severe chronic small vessel  ischemic changes within the cerebral  white matter. Redemonstrated chronic lacunar infarct within the left caudate head. Mild generalized parenchymal atrophy. Mild mucosal thickening within the right frontoethmoidal recess. Electronically Signed   By: Kellie Simmering D.O.   On: 11/13/2021 13:15     Today   Subjective    Amy Kaiser today has no headache,no chest abdominal pain,no new weakness tingling or numbness, feels much better wants to go home today.     Objective   Blood pressure (!) 123/58, pulse 76, temperature 98.8 F (37.1 C), temperature source Oral, resp. rate 18, height 5\' 6"  (1.676 m), weight 74.4 kg, SpO2 92 %.   Intake/Output Summary (Last 24 hours) at 11/14/2021 0913 Last data filed at 11/13/2021 1706 Gross per 24 hour  Intake 12.37 ml  Output --  Net 12.37 ml    Exam  Awake Alert x2, No new F.N deficits, pleasant affect wants to go home Valentine.AT,PERRAL Supple Neck,   Symmetrical Chest wall movement, Good air movement bilaterally, CTAB RRR,No Gallops,   +ve B.Sounds, Abd Soft, Non tender,  No Cyanosis, Clubbing or edema    Data Review   CBC w Diff:  Lab Results  Component Value Date   WBC 8.6 11/14/2021   HGB 10.4 (L) 11/14/2021   HGB 9.4 (L) 04/22/2017   HCT 33.8 (L) 11/14/2021   HCT 29.4 (L) 04/22/2017   PLT 218 11/14/2021   PLT 300 04/22/2017   LYMPHOPCT 34 11/13/2021   MONOPCT 9 11/13/2021   EOSPCT 3 11/13/2021   BASOPCT 1 11/13/2021    CMP:  Lab Results  Component Value Date   NA 138 11/14/2021   NA 143 02/15/2017   K 4.0 11/14/2021   CL 106 11/14/2021   CO2 25 11/14/2021   BUN 17 11/14/2021   BUN 13 02/15/2017   CREATININE 0.96 11/14/2021   PROT 7.7 11/13/2021   ALBUMIN 3.5 11/13/2021   BILITOT 0.6 11/13/2021   ALKPHOS 77 11/13/2021   AST 21 11/13/2021   ALT 10 11/13/2021  .   Total Time in preparing paper work, data evaluation and todays exam - 4 minutes  Lala Lund M.D on 11/14/2021 at 9:13 AM  [facility]

## 2021-11-15 DIAGNOSIS — J4530 Mild persistent asthma, uncomplicated: Secondary | ICD-10-CM | POA: Diagnosis not present

## 2021-11-15 DIAGNOSIS — G4700 Insomnia, unspecified: Secondary | ICD-10-CM | POA: Diagnosis not present

## 2021-11-15 DIAGNOSIS — M810 Age-related osteoporosis without current pathological fracture: Secondary | ICD-10-CM | POA: Diagnosis not present

## 2021-11-15 DIAGNOSIS — G309 Alzheimer's disease, unspecified: Secondary | ICD-10-CM | POA: Diagnosis not present

## 2021-11-15 DIAGNOSIS — F325 Major depressive disorder, single episode, in full remission: Secondary | ICD-10-CM | POA: Diagnosis not present

## 2021-11-15 DIAGNOSIS — Z7982 Long term (current) use of aspirin: Secondary | ICD-10-CM | POA: Diagnosis not present

## 2021-11-15 DIAGNOSIS — D509 Iron deficiency anemia, unspecified: Secondary | ICD-10-CM | POA: Diagnosis not present

## 2021-11-15 DIAGNOSIS — K5900 Constipation, unspecified: Secondary | ICD-10-CM | POA: Diagnosis not present

## 2021-11-15 DIAGNOSIS — I5032 Chronic diastolic (congestive) heart failure: Secondary | ICD-10-CM | POA: Diagnosis not present

## 2021-11-15 DIAGNOSIS — E538 Deficiency of other specified B group vitamins: Secondary | ICD-10-CM | POA: Diagnosis not present

## 2021-11-15 DIAGNOSIS — F4321 Adjustment disorder with depressed mood: Secondary | ICD-10-CM | POA: Diagnosis not present

## 2021-11-15 DIAGNOSIS — M23321 Other meniscus derangements, posterior horn of medial meniscus, right knee: Secondary | ICD-10-CM | POA: Diagnosis not present

## 2021-11-15 DIAGNOSIS — F0282 Dementia in other diseases classified elsewhere, unspecified severity, with psychotic disturbance: Secondary | ICD-10-CM | POA: Diagnosis not present

## 2021-11-15 DIAGNOSIS — F0283 Dementia in other diseases classified elsewhere, unspecified severity, with mood disturbance: Secondary | ICD-10-CM | POA: Diagnosis not present

## 2021-11-15 DIAGNOSIS — I110 Hypertensive heart disease with heart failure: Secondary | ICD-10-CM | POA: Diagnosis not present

## 2021-11-15 DIAGNOSIS — I25118 Atherosclerotic heart disease of native coronary artery with other forms of angina pectoris: Secondary | ICD-10-CM | POA: Diagnosis not present

## 2021-11-22 ENCOUNTER — Other Ambulatory Visit: Payer: Self-pay

## 2021-11-22 ENCOUNTER — Ambulatory Visit: Payer: Medicare Other | Admitting: Physician Assistant

## 2021-11-22 ENCOUNTER — Encounter: Payer: Self-pay | Admitting: Physician Assistant

## 2021-11-22 VITALS — BP 139/69 | HR 89 | Resp 18 | Ht 64.0 in | Wt 161.0 lb

## 2021-11-22 DIAGNOSIS — G250 Essential tremor: Secondary | ICD-10-CM | POA: Diagnosis not present

## 2021-11-22 DIAGNOSIS — G301 Alzheimer's disease with late onset: Secondary | ICD-10-CM

## 2021-11-22 DIAGNOSIS — R441 Visual hallucinations: Secondary | ICD-10-CM | POA: Diagnosis not present

## 2021-11-22 DIAGNOSIS — F02818 Dementia in other diseases classified elsewhere, unspecified severity, with other behavioral disturbance: Secondary | ICD-10-CM | POA: Diagnosis not present

## 2021-11-22 NOTE — Progress Notes (Signed)
Assessment/Plan:   Dementia due to Alzheimer's Disease with Behavioral Disturbance  MMSE today 26/30, essentially unchanged from prior MoCA in November 2021.  She is not on antidementia medications.   Recommendations:  Discussed safety both in and out of the home.  Discussed the importance of regular daily schedule with inclusion of crossword puzzles to maintain brain function.  Continue to monitor mood by PCP Stay active at least 30 minutes at least 3 times a week.  Naps should be scheduled and should be no longer than 60 minutes and should not occur after 2 PM.  Mediterranean diet is recommended  Control cardiovascular risk factors  Follow up in 1 year   Case discussed with Dr. Delice Lesch who agrees with the plan     Subjective:    This is a 88 yo RH woman with a history of hyperlipidemia, migraines, COPD, cervical cancer, depression, anxiety, with Alzheimer's disease with behavioral changes (visual hallucinations). [place] 19/30 in 09/2020.  She was last seen on 05/17/21.   She is not on any dementia medications, we had previously discussed weighing potential benefits versus side effects, especially at her age.   She is again accompanied by her long-time friend [name] who helps supplement the history today.  Since her last visit, she presented to the emergency department via EMS as a code stroke for evaluation of confusion and decreased verbalization, not following commands, then in route, began stuttering, with repetitive speech and began following some commands.  Neurology consultation was obtained.  CT was negative for acute intracranial abnormalities, with moderate least severe chronic small vessel ischemic changes within the cerebral white matter, redemonstrated chronic lacunar infarct within the left caudate head, mild generalized parenchymal atrophy.  She has significant high blood pressure with a systolic of 563 requiring clevidipine drip.  Most likely etiology was a hypertensive  encephalopathy.  MRI was not performed due to cardiac valve replacement.  EEG was normal.  She was discharged the same day. Patient reports that her short-term memory is about the same, [name] agrees.  She continues to live alone, her daughter lives across the street.  The patient continues to manage her own medications, now with a "pop up pillbox ", which prevents her from missing any doses.  She continues to cook, but this time she stays in the kitchen, does not like to leave the stove on, after she burned it several months back.  "I stay until he is ready ".  Most of the time she microwaves however.  Her daughter brings her meals once in a while.  Her appetite is good, denies trouble swallowing.  She admits to not drinking enough water.  She does not drive.  Her daughter manages the finances.  Since she does not engage in a routine anymore, she does not sleep very well.  She states that sometimes she sees someone in the living room, or in the hallway, but is not threatening.  She also reports "happy hallucinations seeing her dead sons "so she does not want to take any medications to raise those hallucinations.  She continues to nap before lunch.  She has been experiencing some stress, having several members of her family in the hospital.  Denies any headaches, double vision, dizziness, focal numbness or tingling, unilateral weakness, tremors or anosmia.  No history of seizures.  She has some urine incontinence, and has intermittent UTIs.  She denies constipation or diarrhea.  She is doing okay after surgery of the right knee.  She continues to  have PT. she has known essential tremors, she reports that this is not worse than before, she denies dropping objects, increased salivation, worsening of her speech.  She is still able to write, but at times she may have difficulties.  She is not interested in any medications at this time.  Latest labs 11/14/2021 show hemoglobin 10.4, hematocrit 33.8, with normal MCV, B12  723, TSH 2.472, UA negative, UDS negative, COVID-negative    History on Initial Assessment 09/09/2020: This is an 88 year old right-handed woman with a history of hyperlipidemia, migraines, COPD, cervical cancer, depression, anxiety, presenting for evaluation of visual hallucinations. [name], her friend of 15 years, is present to provide additional information. After the visit, I also spoke to her daughter [name] on the phone. She lives alone. She does not think her memory is good at all. Her daughter started noticing changes for quite a while, she lost her husband, then her 2 sons, worse in the past 2-3 years when her second son passed away. She repeats herself. She reports she has a terrible time remembering her medications, worse in the past year. She would not recall if she took it already. They tried different ways, she had difficulty with a pillbox. Her daughter and granddaughter live across the street, they come and put medications in her pillbox, calling her daily to remind to take them. Her daughter took over finances last year. She does not drive. She stopped driving after heart surgery years ago when she was "losing control" of her body, no loss of consciousness. This is not occurring any longer. She has not been bathing as much as she wants to due to her right knee or dizziness, she does not feel safe. She does regular "bird baths." She has lost her appetite, her daughter brings food. She had burned a couple of pots. She gets lost in her own house, thinking she is in her bedroom then waking up in the dining room not knowing where she was.  [name] denies any personality changes.    She started having hallucinations within the past year. Her daughter reports hallucinations worsened when a friend of hers was diagnosed with cancer and passed away around 6 months ago. They are sporadic, she called her daughter at 2AM one time asking where her baby was, another time she saw a snake at 2AM. She thought  her sons (who have passed away) visited her, she saw them at the door and heard them, but did not find them in the living room after. The last time she had a hallucination was 3 months ago, she saw her daughter-in-law in the yard. [name] adds that one time she saw a snake in her covers, she was confused and found to have a UTI. She has a history of frequent UTIs. She reports that her mother had visual hallucinations in her late 42s and saw bugs on her.   She reports terrible headaches which are new, at one point she had 3 days where she took Tylenol twice a day. Headaches have quieted down, she has had 1 or 2 since then. She denies any dizziness, diplopia, dysarthria/dysphagia, neck/back pain, focal numbness/tingling/weakness, bowel/bladder dysfunction. She has tremors/"shakes" when her glucose levels are low, none if eating fine. No anosmia. Over the past 2 years, she stumbles around a lot and uses a cane outside. Sleep is good, no clear REM behavior disorder. No history of significant head injuries or alcohol use. .   I personally reviewed MRI brain without contrast  done 06/2020 which did not show any acute changes. There was mild diffuse atrophy, moderate chronic microvascular disease.   PREVIOUS MEDICATIONS:   CURRENT MEDICATIONS:  Outpatient Encounter Medications as of 11/22/2021  Medication Sig   aspirin 81 MG EC tablet Take 162 mg by mouth daily. Swallow whole.   calcium-vitamin D (OSCAL WITH D) 500-200 MG-UNIT tablet Take 1 tablet by mouth 2 (two) times daily.   Cholecalciferol (VITAMIN D3) 1000 units CAPS Take 1,000 Units by mouth daily with lunch.    ferrous sulfate 324 MG TBEC Take 324 mg by mouth 2 (two) times a week.   metoprolol tartrate (LOPRESSOR) 25 MG tablet Take 1/2 tablet (12.5 mg) by mouth twice a day   Multiple Vitamins-Minerals (ICAPS AREDS 2 PO) Take 1 capsule by mouth 2 (two) times daily.   nitrofurantoin (MACRODANTIN) 100 MG capsule Take 100 mg by mouth daily.    nitroGLYCERIN (NITROSTAT) 0.4 MG SL tablet DISSOLVE ONE TABLET UNDER THE TONGUE EVERY 5 MINUTES AS NEEDED FOR CHEST PAIN.  DO NOT EXCEED A TOTAL OF 3 DOSES IN 15 MINUTES   ondansetron (ZOFRAN) 4 MG tablet Take 1 tablet (4 mg total) by mouth every 8 (eight) hours as needed for nausea or vomiting.   rosuvastatin (CRESTOR) 20 MG tablet Take 20 mg by mouth at bedtime.   vitamin B-12 (CYANOCOBALAMIN) 500 MCG tablet Take 1 tablet (500 mcg total) by mouth daily.   furosemide (LASIX) 20 MG tablet Take 1 tablet (20 mg total) by mouth every Monday, Wednesday, and Friday.   No facility-administered encounter medications on file as of 11/22/2021.     Objective:     PHYSICAL EXAMINATION:    VITALS:   Vitals:   11/22/21 0927  BP: 139/69  Pulse: 89  Resp: 18  SpO2: 98%  Weight: 161 lb (73 kg)  Height: 5\' 4"  (1.626 m)    GEN:  The patient appears stated age and is in NAD. HEENT:  Normocephalic, atraumatic.   Neurological examination:  General: NAD, well-groomed, appears stated age. Orientation: The patient is alert. Oriented to person, place and date Cranial nerves: There is good facial symmetry.The speech is fluent and clear. No aphasia or dysarthria. Fund of knowledge is appropriate. 2/3 delayed recall, 2/5 WORLD backwards. Recent and remote memory are impaired. Attention and concentration are reduced.  Able to name objects and repeat phrases.  Hearing is intact to conversational tone.    Sensation: Sensation is intact to light touch throughout Motor: Strength is at least antigravity x4. Tremors: known bilateral tremors, mild, intermittent  DTR's 2/4 in [place] Cognitive Assessment  09/09/2020  Visuospatial/ Executive (0/5) 4  Naming (0/3) 2  Attention: Read list of digits (0/2) 1  Attention: Read list of letters (0/1) 0  Attention: Serial 7 subtraction starting at 100 (0/3) 0  Language: Repeat phrase (0/2) 2  Language : Fluency (0/1) 1  Abstraction (0/2) 2  Delayed Recall  (0/5) 0  Orientation (0/6) 6  Total 18  Adjusted Score (based on education) 19   MMSE - Mini Mental State Exam 11/22/2021  Orientation to time 5  Orientation to Place 5  Registration 3  Attention/ Calculation 2  Recall 2  Language- name 2 objects 2  Language- repeat 1  Language- follow 3 step command 3  Language- read & follow direction 1  Write a sentence 1  Copy design 1  Total score 26    No flowsheet data found.     Movement  examination: Tone: There is normal tone in the UE/LE. No cogwheeling Abnormal movements:  no tremor.  No myoclonus.  No asterixis.   Coordination:  There is no decremation with RAM's. Normal finger to nose  Gait and Station: The patient has no difficulty arising out of a deep-seated chair without the use of the hands. The patient's stride length is good.  Gait is cautious and narrow.     Total time spent on today's visit was minutes, including both face-to-face time and nonface-to-face time. Time included that spent on review of records (prior notes available to me/labs/imaging if pertinent), discussing treatment and goals, answering patient's questions and coordinating care.  Cc:  Harlan Stains, MD Sharene Butters, PA-C

## 2021-11-22 NOTE — Patient Instructions (Signed)
Good to see you! Continue all your medications. Continue close supervision. Follow-up in 1 yr with Dr. Delice Lesch  , call for any changes Continue PT   FALL PRECAUTIONS: Be cautious when walking. Scan the area for obstacles that may increase the risk of trips and falls. When getting up in the mornings, sit up at the edge of the bed for a few minutes before getting out of bed. Consider elevating the bed at the head end to avoid drop of blood pressure when getting up. Walk always in a well-lit room (use night lights in the walls). Avoid area rugs or power cords from appliances in the middle of the walkways. Use a walker or a cane if necessary and consider physical therapy for balance exercise. Get your eyesight checked regularly.  HOME SAFETY: Consider the safety of the kitchen when operating appliances like stoves, microwave oven, and blender. Consider having supervision and share cooking responsibilities until no longer able to participate in those. Accidents with firearms and other hazards in the house should be identified and addressed as well.   ABILITY TO BE LEFT ALONE: If patient is unable to contact 911 operator, consider using LifeLine, or when the need is there, arrange for someone to stay with patients. Smoking is a fire hazard, consider supervision or cessation. Risk of wandering should be assessed by caregiver and if detected at any point, supervision and safe proof recommendations should be instituted.  MEDICATION SUPERVISION: Inability to self-administer medication needs to be constantly addressed. Implement a mechanism to ensure safe administration of the medications.  RECOMMENDATIONS FOR ALL PATIENTS WITH MEMORY PROBLEMS: 1. Continue to exercise (Recommend 30 minutes of walking everyday, or 3 hours every week) 2. Increase social interactions - continue going to [place] and enjoy social gatherings with friends and family 3. Eat healthy, avoid fried foods and eat more fruits and vegetables 4.  Maintain adequate blood pressure, blood sugar, and blood cholesterol level. Reducing the risk of stroke and cardiovascular disease also helps promoting better memory. 5. Avoid stressful situations. Live a simple life and avoid aggravations. Organize your time and prepare for the next day in anticipation. 6. Sleep well, avoid any interruptions of sleep and avoid any distractions in the bedroom that may interfere with adequate sleep quality 7. Avoid sugar, avoid sweets as there is a strong link between excessive sugar intake, diabetes, and cognitive impairment The Mediterranean diet has been shown to help patients reduce the risk of progressive memory disorders and reduces cardiovascular risk. This includes eating fish, eat fruits and green leafy vegetables, nuts like almonds and hazelnuts, walnuts, and also use olive oil. Avoid fast foods and fried foods as much as possible. Avoid sweets and sugar as sugar use has been linked to worsening of memory function.  There is always a concern of gradual progression of memory problems. If this is the case, then we may need to adjust level of care according to patient needs. Support, both to the patient and caregiver, should then be put into place.

## 2021-11-23 DIAGNOSIS — Z09 Encounter for follow-up examination after completed treatment for conditions other than malignant neoplasm: Secondary | ICD-10-CM | POA: Diagnosis not present

## 2021-11-23 DIAGNOSIS — D649 Anemia, unspecified: Secondary | ICD-10-CM | POA: Diagnosis not present

## 2021-11-23 DIAGNOSIS — G9340 Encephalopathy, unspecified: Secondary | ICD-10-CM | POA: Diagnosis not present

## 2021-11-23 DIAGNOSIS — R9389 Abnormal findings on diagnostic imaging of other specified body structures: Secondary | ICD-10-CM | POA: Diagnosis not present

## 2021-11-25 DIAGNOSIS — S60454A Superficial foreign body of right ring finger, initial encounter: Secondary | ICD-10-CM | POA: Diagnosis not present

## 2021-11-25 DIAGNOSIS — Z23 Encounter for immunization: Secondary | ICD-10-CM | POA: Diagnosis not present

## 2021-11-29 DIAGNOSIS — S60454A Superficial foreign body of right ring finger, initial encounter: Secondary | ICD-10-CM | POA: Diagnosis not present

## 2021-12-15 DIAGNOSIS — F325 Major depressive disorder, single episode, in full remission: Secondary | ICD-10-CM | POA: Diagnosis not present

## 2021-12-15 DIAGNOSIS — E538 Deficiency of other specified B group vitamins: Secondary | ICD-10-CM | POA: Diagnosis not present

## 2021-12-15 DIAGNOSIS — K5900 Constipation, unspecified: Secondary | ICD-10-CM | POA: Diagnosis not present

## 2021-12-15 DIAGNOSIS — Z7982 Long term (current) use of aspirin: Secondary | ICD-10-CM | POA: Diagnosis not present

## 2021-12-15 DIAGNOSIS — G309 Alzheimer's disease, unspecified: Secondary | ICD-10-CM | POA: Diagnosis not present

## 2021-12-15 DIAGNOSIS — I110 Hypertensive heart disease with heart failure: Secondary | ICD-10-CM | POA: Diagnosis not present

## 2021-12-15 DIAGNOSIS — F0283 Dementia in other diseases classified elsewhere, unspecified severity, with mood disturbance: Secondary | ICD-10-CM | POA: Diagnosis not present

## 2021-12-15 DIAGNOSIS — M810 Age-related osteoporosis without current pathological fracture: Secondary | ICD-10-CM | POA: Diagnosis not present

## 2021-12-15 DIAGNOSIS — F4321 Adjustment disorder with depressed mood: Secondary | ICD-10-CM | POA: Diagnosis not present

## 2021-12-15 DIAGNOSIS — G4700 Insomnia, unspecified: Secondary | ICD-10-CM | POA: Diagnosis not present

## 2021-12-15 DIAGNOSIS — I5032 Chronic diastolic (congestive) heart failure: Secondary | ICD-10-CM | POA: Diagnosis not present

## 2021-12-15 DIAGNOSIS — D509 Iron deficiency anemia, unspecified: Secondary | ICD-10-CM | POA: Diagnosis not present

## 2021-12-15 DIAGNOSIS — I25118 Atherosclerotic heart disease of native coronary artery with other forms of angina pectoris: Secondary | ICD-10-CM | POA: Diagnosis not present

## 2021-12-15 DIAGNOSIS — J4530 Mild persistent asthma, uncomplicated: Secondary | ICD-10-CM | POA: Diagnosis not present

## 2021-12-15 DIAGNOSIS — M23321 Other meniscus derangements, posterior horn of medial meniscus, right knee: Secondary | ICD-10-CM | POA: Diagnosis not present

## 2021-12-15 DIAGNOSIS — F0282 Dementia in other diseases classified elsewhere, unspecified severity, with psychotic disturbance: Secondary | ICD-10-CM | POA: Diagnosis not present

## 2021-12-18 ENCOUNTER — Other Ambulatory Visit: Payer: Self-pay | Admitting: Physician Assistant

## 2021-12-18 ENCOUNTER — Ambulatory Visit
Admission: RE | Admit: 2021-12-18 | Discharge: 2021-12-18 | Disposition: A | Discharge status: Home / Self Care | Payer: Medicare Other | Source: Ambulatory Visit | Attending: Physician Assistant | Admitting: Physician Assistant

## 2021-12-18 ENCOUNTER — Other Ambulatory Visit: Payer: Self-pay

## 2021-12-18 DIAGNOSIS — Z09 Encounter for follow-up examination after completed treatment for conditions other than malignant neoplasm: Secondary | ICD-10-CM

## 2021-12-18 DIAGNOSIS — J439 Emphysema, unspecified: Secondary | ICD-10-CM | POA: Diagnosis not present

## 2021-12-18 DIAGNOSIS — R918 Other nonspecific abnormal finding of lung field: Secondary | ICD-10-CM | POA: Diagnosis not present

## 2021-12-21 DIAGNOSIS — I10 Essential (primary) hypertension: Secondary | ICD-10-CM | POA: Diagnosis not present

## 2021-12-21 DIAGNOSIS — I700 Atherosclerosis of aorta: Secondary | ICD-10-CM | POA: Diagnosis not present

## 2021-12-21 DIAGNOSIS — I2510 Atherosclerotic heart disease of native coronary artery without angina pectoris: Secondary | ICD-10-CM | POA: Diagnosis not present

## 2021-12-21 DIAGNOSIS — E785 Hyperlipidemia, unspecified: Secondary | ICD-10-CM | POA: Diagnosis not present

## 2021-12-29 ENCOUNTER — Other Ambulatory Visit: Payer: Self-pay

## 2021-12-29 ENCOUNTER — Ambulatory Visit (INDEPENDENT_AMBULATORY_CARE_PROVIDER_SITE_OTHER): Payer: Medicare Other

## 2021-12-29 DIAGNOSIS — Z952 Presence of prosthetic heart valve: Secondary | ICD-10-CM

## 2021-12-29 DIAGNOSIS — I272 Pulmonary hypertension, unspecified: Secondary | ICD-10-CM

## 2021-12-29 LAB — ECHOCARDIOGRAM COMPLETE
AR max vel: 1.32 cm2
AV Area VTI: 1.47 cm2
AV Area mean vel: 1.35 cm2
AV Mean grad: 10 mmHg
AV Peak grad: 20.3 mmHg
Ao pk vel: 2.25 m/s
Area-P 1/2: 2.97 cm2
Calc EF: 57.8 %
S' Lateral: 2.8 cm
Single Plane A2C EF: 55.6 %
Single Plane A4C EF: 58.5 %

## 2022-01-02 ENCOUNTER — Other Ambulatory Visit: Payer: Self-pay | Admitting: Cardiovascular Disease

## 2022-01-02 NOTE — Telephone Encounter (Signed)
°*  STAT* If patient is at the pharmacy, call can be transferred to refill team.   1. Which medications need to be refilled? (please list name of each medication and dose if known) furosemide 20 MG 1 tablet every day  2. Which pharmacy/location (including street and city if local pharmacy) is medication to be sent to? [facility] on [address] in [place]  3. Do they need a 30 day or 90 day supply? 90 day

## 2022-01-30 DIAGNOSIS — N3020 Other chronic cystitis without hematuria: Secondary | ICD-10-CM | POA: Diagnosis not present

## 2022-04-05 ENCOUNTER — Encounter: Payer: Self-pay | Admitting: Nurse Practitioner

## 2022-04-05 ENCOUNTER — Ambulatory Visit (INDEPENDENT_AMBULATORY_CARE_PROVIDER_SITE_OTHER): Payer: Medicare Other | Admitting: Nurse Practitioner

## 2022-04-05 VITALS — BP 138/73 | HR 69 | Temp 97.9°F | Ht 64.0 in | Wt 160.1 lb

## 2022-04-05 DIAGNOSIS — M19042 Primary osteoarthritis, left hand: Secondary | ICD-10-CM

## 2022-04-05 DIAGNOSIS — M19041 Primary osteoarthritis, right hand: Secondary | ICD-10-CM | POA: Diagnosis not present

## 2022-04-05 DIAGNOSIS — Z7689 Persons encountering health services in other specified circumstances: Secondary | ICD-10-CM

## 2022-04-05 DIAGNOSIS — Z0000 Encounter for general adult medical examination without abnormal findings: Secondary | ICD-10-CM

## 2022-04-05 DIAGNOSIS — D485 Neoplasm of uncertain behavior of skin: Secondary | ICD-10-CM | POA: Diagnosis not present

## 2022-04-05 DIAGNOSIS — I10 Essential (primary) hypertension: Secondary | ICD-10-CM | POA: Diagnosis not present

## 2022-04-05 DIAGNOSIS — J4530 Mild persistent asthma, uncomplicated: Secondary | ICD-10-CM

## 2022-04-05 NOTE — Progress Notes (Signed)
New Patient Office Visit  Subjective    Patient ID: Amy Kaiser, female    DOB: 05/31/1929  Age: 88 y.o. MRN: [id number]  CC:  Chief Complaint  Patient presents with   New Patient (Initial Visit)    HPI Amy Kaiser presents to establish care The patient is coming from a different local provider. She states that she wants to have a provider in [facility] so hat all of her providers can be connected.  -recent fall 11/2021. Was seen in ER.  Is now seeing a neurologist.  -sees Dr. Rockey Situ, cardiology.  -has skin growth from right upper arm. Has been present for several months. Has had this in the past. Was able to use home remedy to remove them. This time, she is afraid to do that as she now has an artificial valve in her heart.    Outpatient Encounter Medications as of 04/05/2022  Medication Sig   aspirin 81 MG EC tablet Take 162 mg by mouth daily. Swallow whole.   calcium-vitamin D (OSCAL WITH D) 500-200 MG-UNIT tablet Take 1 tablet by mouth 2 (two) times daily.   Cholecalciferol (VITAMIN D3) 1000 units CAPS Take 1,000 Units by mouth daily with lunch.    ferrous sulfate 324 MG TBEC Take 324 mg by mouth 2 (two) times a week.   furosemide (LASIX) 20 MG tablet TAKE 1 TABLET BY MOUTH ON MONDAY, WEDNESDAY AND FRIDAY   metoprolol tartrate (LOPRESSOR) 25 MG tablet Take 1/2 tablet (12.5 mg) by mouth twice a day   Multiple Vitamins-Minerals (ICAPS AREDS 2 PO) Take 1 capsule by mouth 2 (two) times daily.   nitrofurantoin (MACRODANTIN) 100 MG capsule Take 100 mg by mouth daily.   nitroGLYCERIN (NITROSTAT) 0.4 MG SL tablet DISSOLVE ONE TABLET UNDER THE TONGUE EVERY 5 MINUTES AS NEEDED FOR CHEST PAIN.  DO NOT EXCEED A TOTAL OF 3 DOSES IN 15 MINUTES   ondansetron (ZOFRAN) 4 MG tablet Take 1 tablet (4 mg total) by mouth every 8 (eight) hours as needed for nausea or vomiting.   rosuvastatin (CRESTOR) 20 MG tablet Take 20 mg by mouth at bedtime.   vitamin B-12 (CYANOCOBALAMIN) 500 MCG tablet  Take 1 tablet (500 mcg total) by mouth daily.   No facility-administered encounter medications on file as of 04/05/2022.    Past Medical History:  Diagnosis Date   Anemia    Anxiety    Arthritis    "just about all over" (03/11/2017)   Asthma    Cervical cancer ([place])    Chronic UTI    COPD (chronic obstructive pulmonary disease) ([place])    Coronary artery disease    Depression    Heart murmur    Hip fracture ([facility])    left hip   Hyperlipidemia    Low blood sugar    Migraine    "work related; quit when I quit work in ~ 1997" (03/11/2017)   PONV (postoperative nausea and vomiting)    Skin cancer    back    Past Surgical History:  Procedure Laterality Date   APPENDECTOMY     CARDIAC CATHETERIZATION  02/2017   CATARACT EXTRACTION W/ INTRAOCULAR LENS  IMPLANT, BILATERAL Bilateral    CORONARY ANGIOPLASTY WITH STENT PLACEMENT  03/11/2017   CORONARY STENT INTERVENTION N/A 03/11/2017   Procedure: Coronary Stent Intervention;  Surgeon: Sherren Mocha, MD;  Location: [facility];  Service: Cardiovascular;  Laterality: N/A;   IR RADIOLOGY PERIPHERAL GUIDED IV START  02/26/2017  IR US GUIDE VASC ACCESS RIGHT  02/26/2017   KNEE ARTHROSCOPY Right 03/09/2021   Procedure: RIGHT KNEE ARTHROSCOPY WITH PARTIAL MEDIAL AND LATERAL MENISCECTOMIES;  Surgeon: [name], MD;  Location: [facility];  Service: Orthopedics;  Laterality: Right;   RIGHT/LEFT HEART CATH AND CORONARY ANGIOGRAPHY N/A 02/20/2017   Procedure: Right/Left Heart Cath and Coronary Angiography;  Surgeon: Sherren Mocha, MD;  Location: [facility];  Service: Cardiovascular;  Laterality: N/A;   SKIN CANCER EXCISION     "back"   TEE WITHOUT CARDIOVERSION N/A 04/09/2017   Procedure: TRANSESOPHAGEAL ECHOCARDIOGRAM (TEE);  Surgeon: Sherren Mocha, MD;  Location: [facility];  Service: Open Heart Surgery;  Laterality: N/A;   TRANSCATHETER AORTIC VALVE REPLACEMENT, TRANSFEMORAL N/A 04/09/2017   Procedure:  TRANSCATHETER AORTIC VALVE REPLACEMENT, TRANSFEMORAL;  Surgeon: Sherren Mocha, MD;  Location: [facility];  Service: Open Heart Surgery;  Laterality: N/A;   VAGINAL HYSTERECTOMY      History reviewed. No pertinent family history.  Social History   Socioeconomic History   Marital status: Widowed    Spouse name: Not on file   Number of children: 3   Years of education: Not on file   Highest education level: Not on file  Occupational History   Not on file  Tobacco Use   Smoking status: Never   Smokeless tobacco: Never  Vaping Use   Vaping Use: Never used  Substance and Sexual Activity   Alcohol use: No   Drug use: No   Sexual activity: Not Currently  Other Topics Concern   Not on file  Social History Narrative   Right handed    Lives alone   Social Determinants of Health   Financial Resource Strain: Not on file  Food Insecurity: Not on file  Transportation Needs: Not on file  Physical Activity: Not on file  Stress: Not on file  Social Connections: Not on file  Intimate Partner Violence: Not on file    Review of Systems  Constitutional:  Negative for chills, fever and malaise/fatigue.  HENT:  Negative for congestion, sinus pain and sore throat.   Eyes: Negative.   Respiratory:  Negative for cough, shortness of breath and wheezing.   Cardiovascular:  Negative for chest pain, palpitations and leg swelling.  Gastrointestinal:  Negative for constipation, diarrhea, nausea and vomiting.  Genitourinary: Negative.   Musculoskeletal:  Negative for myalgias.  Skin: Negative.        Skin growth of right upper arm. Getting longer and larger. Itches slightly.   Neurological:  Negative for dizziness and headaches.  Endo/Heme/Allergies:  Does not bruise/bleed easily.  Psychiatric/Behavioral:  Negative for depression. The patient is not nervous/anxious.        Objective    Today's Vitals   04/05/22 1011  BP: 138/73  Pulse: 69  Temp: 97.9 F (36.6 C)  SpO2: 96%  Weight:  160 lb 1.9 oz (72.6 kg)  Height: '5\' 4"'$  (1.626 m)   Body mass index is 27.48 kg/m.   Physical Exam Vitals and nursing note reviewed.  Constitutional:      Appearance: Normal appearance. She is well-developed.  HENT:     Head: Normocephalic and atraumatic.     Nose: Nose normal.     Mouth/Throat:     Mouth: Mucous membranes are moist.     Pharynx: Oropharynx is clear.  Eyes:     Extraocular Movements: Extraocular movements intact.     Conjunctiva/sclera: Conjunctivae normal.     Pupils: Pupils are equal,  round, and reactive to light.  Neck:     Vascular: No carotid bruit.  Cardiovascular:     Rate and Rhythm: Normal rate and regular rhythm.     Pulses: Normal pulses.     Heart sounds: Normal heart sounds.  Pulmonary:     Effort: Pulmonary effort is normal.     Breath sounds: Normal breath sounds.  Abdominal:     Palpations: Abdomen is soft.  Musculoskeletal:        General: Normal range of motion.     Cervical back: Normal range of motion and neck supple.  Lymphadenopathy:     Cervical: No cervical adenopathy.  Skin:    General: Skin is warm and dry.     Capillary Refill: Capillary refill takes less than 2 seconds.       Neurological:     General: No focal deficit present.     Mental Status: She is alert and oriented to person, place, and time.  Psychiatric:        Mood and Affect: Mood normal.        Behavior: Behavior normal.        Thought Content: Thought content normal.        Judgment: Judgment normal.      Assessment & Plan:  1. Neoplasm of uncertain behavior of skin of upper arm Skin lesion present on the right lateral upper arm.  Refer to dermatology for further evaluation and treatment. - Ambulatory referral to Dermatology  2. Primary osteoarthritis of both hands Patient would like referral to orthopedics for further evaluation and treatment of osteoarthritis of both hands.  Referral given during today's visit. - Ambulatory referral to Orthopedic  Surgery  3. Essential hypertension Stable.  Continue blood pressure medication as prescribed.  4. Mild persistent asthma without complication Stable.  Continue inhalers and respiratory medications as prescribed.  5. Encounter to establish care Appointment today to establish new primary care provider      Problem List Items Addressed This Visit       Cardiovascular and Mediastinum   Essential hypertension     Respiratory   Mild persistent asthma     Musculoskeletal and Integument   Neoplasm of uncertain behavior of skin of upper arm - Primary   Relevant Orders   Ambulatory referral to Dermatology   Primary osteoarthritis of both hands   Relevant Orders   Ambulatory referral to Orthopedic Surgery   Other Visit Diagnoses     Encounter to establish care           Return in about 2 months (around 06/05/2022) for medicare wellness, FBW at time of visit.   Ronnell Freshwater, NP

## 2022-04-09 DIAGNOSIS — M19041 Primary osteoarthritis, right hand: Secondary | ICD-10-CM | POA: Insufficient documentation

## 2022-04-09 DIAGNOSIS — D485 Neoplasm of uncertain behavior of skin: Secondary | ICD-10-CM | POA: Insufficient documentation

## 2022-04-12 ENCOUNTER — Ambulatory Visit (INDEPENDENT_AMBULATORY_CARE_PROVIDER_SITE_OTHER): Payer: Medicare Other

## 2022-04-12 ENCOUNTER — Encounter: Payer: Self-pay | Admitting: Orthopedic Surgery

## 2022-04-12 ENCOUNTER — Ambulatory Visit: Payer: Medicare Other | Admitting: Orthopedic Surgery

## 2022-04-12 VITALS — BP 177/62 | HR 74 | Ht 64.0 in | Wt 160.1 lb

## 2022-04-12 DIAGNOSIS — M79642 Pain in left hand: Secondary | ICD-10-CM

## 2022-04-12 DIAGNOSIS — M79641 Pain in right hand: Secondary | ICD-10-CM

## 2022-04-12 DIAGNOSIS — M19049 Primary osteoarthritis, unspecified hand: Secondary | ICD-10-CM | POA: Diagnosis not present

## 2022-04-12 MED ORDER — MELOXICAM 7.5 MG PO TABS: 7.5000 mg | ORAL_TABLET | Freq: Every day | ORAL | 0 refills | Status: AC

## 2022-04-12 NOTE — Progress Notes (Signed)
Office Visit Note   Patient: Amy Kaiser           Date of Birth: Aug 23, 1929           MRN: [id number] Visit Date: 04/12/2022              Requested by: Ronnell Freshwater, NP [place],  [place] [id number] PCP: Ronnell Freshwater, NP   Assessment & Plan: Visit Diagnoses:  1. Pain in both hands   2. Hand arthritis     Plan: We reviewed patient's x-rays which demonstrate diffuse, scattered osteoarthritis throughout both hands.  We discussed the nature of hand arthritis as well as his diagnosis, prognosis, both conservative and surgical treatment options.  He had not had any treatment so far.  At this point, she would like to start with an oral anti-inflammatory medication.  I will send a  Meloxicam to her pharmacy.  I can see her back as needed.  Follow-Up Instructions: No follow-ups on file.   Orders:  Orders Placed This Encounter  Procedures   XR Hand Complete Left   XR Hand Complete Right   No orders of the defined types were placed in this encounter.     Procedures: No procedures performed   Clinical Data: No additional findings.   Subjective: Chief Complaint  Patient presents with   Right Hand - Pain   Left Hand - Pain    This is a 88 year old right-hand-dominant female presents with pain in both hands.  Has been going on for years now.  She describes pain in multiple joints of both hands.  The worst pain seems to be in the right hand at the thumb IP joint in the middle and index finger PIP joints.  These tend to hurt all the time.  She has pain in various other joints throughout both hands that is intermittent in nature.  She has good days and bad days.  She notes that she does have swelling in multiple joints in both hands that seem to resolve on their own.  When her hands are really bothersome, she will massage them and run hot water over her hands.  She has otherwise had no treatment for this.  She also notes that she will have  occasionally have cramping type pain in her hands.  She has limited range of motion of both hands secondary to pain and stiffness.  She has difficulty with her activities such as unlocking the door or opening jars and lids.    Review of Systems   Objective: Vital Signs: BP (!) 177/62 (BP Location: Left Arm, Patient Position: Sitting)   Pulse 74   Ht '5\' 4"'$  (1.626 m)   Wt 160 lb 1.6 oz (72.6 kg)   BMI 27.48 kg/m   Physical Exam Constitutional:      Appearance: Normal appearance.  Cardiovascular:     Rate and Rhythm: Normal rate.     Pulses: Normal pulses.  Pulmonary:     Effort: Pulmonary effort is normal.  Skin:    General: Skin is warm and dry.     Capillary Refill: Capillary refill takes less than 2 seconds.  Neurological:     Mental Status: She is alert.     Right Hand Exam   Tenderness  Right hand tenderness location: TTP at thumb IP joint, index finger PIP joint, and middle finger PIP joint.  Other  Erythema: absent Sensation: normal Pulse: present  Comments:  Diffuse swelling in  multiple joints of hand but seemingly worse at thumb IP, index PIP, and middle finger PIP joints.  Able to make a near complete fist.    Left Hand Exam   Tenderness  Left hand tenderness location: No significant tenderness.   Other  Erythema: absent Sensation: normal Pulse: present  Comments:  Stiffness in multiple fingers.  Unable to make a complete fist secondary to pain and stiffness.       Specialty Comments:  No specialty comments available.  Imaging: No results found.   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 04/09/2022   Primary osteoarthritis of both hands 04/09/2022   Hypertensive emergency 11/13/2021   Alzheimer's disease ([facility]) 11/13/2021   Encephalopathy acute 11/13/2021   Other meniscus derangements, posterior horn of medial meniscus, right knee  12/22/2020   Complex tear of lateral meniscus of right knee as current injury 12/22/2020   Vitamin D deficiency 11/14/2020   Vitamin B12 deficiency 11/14/2020   Transient ischemic attack 11/14/2020   Osteoporosis 11/14/2020   Mild persistent asthma 11/14/2020   Major depressive disorder, single episode, in full remission ([facility]) 11/14/2020   Major depression in partial remission ([place]) 11/14/2020   Iron deficiency anemia 11/14/2020   Insomnia 11/14/2020   Hallucinations 11/14/2020   Grief 11/14/2020   Exacerbation of asthma 11/14/2020   Essential hypertension 10/06/2018   Chronic diastolic CHF (congestive heart failure) ([facility]) 10/06/2018   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) 04/09/2017   Coronary artery disease with exertional angina ([place]) 03/11/2017   Severe aortic stenosis 02/20/2017   Past Medical History:  Diagnosis Date   Anemia    Anxiety    Arthritis    "just about all over" (03/11/2017)   Asthma    Cervical cancer ([facility])    Chronic UTI    COPD (chronic obstructive pulmonary disease) ([facility])    Coronary artery disease    Depression    Heart murmur    Hip fracture ([facility])    left hip   Hyperlipidemia    Low blood sugar    Migraine    "work related; quit when I quit work in ~ 1997" (03/11/2017)   PONV (postoperative nausea and vomiting)    Skin cancer    back    No family history on file.  Past Surgical History:  Procedure Laterality Date   APPENDECTOMY     CARDIAC CATHETERIZATION  02/2017   CATARACT EXTRACTION W/ INTRAOCULAR LENS  IMPLANT, BILATERAL Bilateral    CORONARY ANGIOPLASTY WITH STENT PLACEMENT  03/11/2017   CORONARY STENT INTERVENTION N/A 03/11/2017   Procedure: Coronary Stent Intervention;  Surgeon: Sherren Mocha, MD;  Location: [facility];  Service: Cardiovascular;  Laterality: N/A;   IR RADIOLOGY PERIPHERAL GUIDED IV START  02/26/2017   IR US GUIDE VASC ACCESS RIGHT  02/26/2017   KNEE ARTHROSCOPY Right 03/09/2021    Procedure: RIGHT KNEE ARTHROSCOPY WITH PARTIAL MEDIAL AND LATERAL MENISCECTOMIES;  Surgeon: [name], MD;  Location: [facility];  Service: Orthopedics;  Laterality: Right;   RIGHT/LEFT HEART CATH AND CORONARY ANGIOGRAPHY N/A 02/20/2017   Procedure: Right/Left Heart Cath and Coronary Angiography;  Surgeon: Sherren Mocha, MD;  Location: [facility];  Service: Cardiovascular;  Laterality: N/A;   SKIN CANCER EXCISION     "back"   TEE WITHOUT CARDIOVERSION N/A 04/09/2017   Procedure: TRANSESOPHAGEAL ECHOCARDIOGRAM (TEE);  Surgeon: Sherren Mocha, MD;  Location: [facility];  Service: Open Heart Surgery;  Laterality: N/A;   TRANSCATHETER AORTIC VALVE REPLACEMENT, TRANSFEMORAL N/A 04/09/2017   Procedure: TRANSCATHETER AORTIC VALVE REPLACEMENT, TRANSFEMORAL;  Surgeon: Sherren Mocha, MD;  Location: [facility];  Service: Open Heart Surgery;  Laterality: N/A;   VAGINAL HYSTERECTOMY     Social History   Occupational History   Not on file  Tobacco Use   Smoking status: Never   Smokeless tobacco: Never  Vaping Use   Vaping Use: Never used  Substance and Sexual Activity   Alcohol use: No   Drug use: No   Sexual activity: Not Currently

## 2022-05-11 DIAGNOSIS — I10 Essential (primary) hypertension: Secondary | ICD-10-CM | POA: Diagnosis not present

## 2022-05-11 DIAGNOSIS — K047 Periapical abscess without sinus: Secondary | ICD-10-CM | POA: Diagnosis not present

## 2022-05-11 DIAGNOSIS — N390 Urinary tract infection, site not specified: Secondary | ICD-10-CM | POA: Diagnosis not present

## 2022-05-11 DIAGNOSIS — R410 Disorientation, unspecified: Secondary | ICD-10-CM | POA: Diagnosis not present

## 2022-06-05 ENCOUNTER — Encounter: Payer: Self-pay | Admitting: Nurse Practitioner

## 2022-06-05 ENCOUNTER — Ambulatory Visit (INDEPENDENT_AMBULATORY_CARE_PROVIDER_SITE_OTHER): Payer: Medicare Other | Admitting: Nurse Practitioner

## 2022-06-05 VITALS — BP 152/84 | HR 78 | Temp 98.7°F | Ht 67.0 in | Wt 158.0 lb

## 2022-06-05 DIAGNOSIS — M19041 Primary osteoarthritis, right hand: Secondary | ICD-10-CM

## 2022-06-05 DIAGNOSIS — Z13820 Encounter for screening for osteoporosis: Secondary | ICD-10-CM

## 2022-06-05 DIAGNOSIS — M19042 Primary osteoarthritis, left hand: Secondary | ICD-10-CM

## 2022-06-05 DIAGNOSIS — E2839 Other primary ovarian failure: Secondary | ICD-10-CM

## 2022-06-05 DIAGNOSIS — I10 Essential (primary) hypertension: Secondary | ICD-10-CM

## 2022-06-05 DIAGNOSIS — N390 Urinary tract infection, site not specified: Secondary | ICD-10-CM

## 2022-06-05 DIAGNOSIS — Z0000 Encounter for general adult medical examination without abnormal findings: Secondary | ICD-10-CM

## 2022-06-05 MED ORDER — NITROFURANTOIN MACROCRYSTAL 100 MG PO CAPS: 100.0000 mg | ORAL_CAPSULE | Freq: Every day | ORAL | 1 refills | Status: DC

## 2022-06-05 MED ORDER — CELECOXIB 50 MG PO CAPS: 50.0000 mg | ORAL_CAPSULE | Freq: Two times a day (BID) | ORAL | 1 refills | Status: DC | PRN

## 2022-06-05 NOTE — Progress Notes (Signed)
Subjective:   Amy Kaiser is a 88 y.o. female who presents for Medicare Annual (Subsequent) preventive examination.  Review of Systems    Review of Systems  Constitutional:  Positive for malaise/fatigue. Negative for chills and fever.       Decreased appetite  with two pound weight loss since last visit   HENT:  Negative for congestion, sinus pain and sore throat.   Eyes: Negative.   Respiratory:  Negative for cough, shortness of breath and wheezing.   Cardiovascular:  Negative for chest pain, palpitations and leg swelling.       Mildly elevated blood pressure today   Gastrointestinal:  Negative for constipation, diarrhea, nausea and vomiting.  Genitourinary: Negative.   Musculoskeletal:  Positive for joint pain and myalgias.       Joint pain and swelling, especially bad in her hands   Skin: Negative.   Neurological:  Negative for dizziness and headaches.  Endo/Heme/Allergies:  Does not bruise/bleed easily.  Psychiatric/Behavioral:  Negative for depression. The patient is not nervous/anxious.           Objective:    Today's Vitals   06/05/22 1100  BP: (!) 152/84  Pulse: 78  Temp: 98.7 F (37.1 C)  SpO2: 97%  Weight: 158 lb (71.7 kg)  Height: '5\' 7"'$  (1.702 m)   Body mass index is 24.75 kg/m.  Wt Readings from Last 3 Encounters:  06/05/22 158 lb (71.7 kg)  04/12/22 160 lb 1.6 oz (72.6 kg)  04/05/22 160 lb 1.9 oz (72.6 kg)    Physical Exam Vitals and nursing note reviewed.  Constitutional:      Appearance: Normal appearance. She is well-developed.  HENT:     Head: Normocephalic and atraumatic.     Right Ear: Tympanic membrane, ear canal and external ear normal.     Left Ear: Tympanic membrane, ear canal and external ear normal.     Nose: Nose normal.     Mouth/Throat:     Mouth: Mucous membranes are moist.     Pharynx: Oropharynx is clear.  Eyes:     Extraocular Movements: Extraocular movements intact.     Conjunctiva/sclera: Conjunctivae normal.      Pupils: Pupils are equal, round, and reactive to light.  Neck:     Vascular: No carotid bruit.  Cardiovascular:     Rate and Rhythm: Normal rate and regular rhythm.     Pulses: Normal pulses.     Heart sounds: Normal heart sounds.  Pulmonary:     Effort: Pulmonary effort is normal.     Breath sounds: Normal breath sounds.  Abdominal:     General: Bowel sounds are normal. There is no distension.     Palpations: Abdomen is soft. There is no mass.     Tenderness: There is no abdominal tenderness. There is no right CVA tenderness, left CVA tenderness, guarding or rebound.     Hernia: No hernia is present.  Musculoskeletal:        General: Normal range of motion.     Cervical back: Normal range of motion and neck supple.     Comments: Generalized joint  pain, most severe in the hands. There is considerable joint swelling of the metacarpal joints of both hands. ROM and grip strength  are mildly diminished, bilaterally   Lymphadenopathy:     Cervical: No cervical adenopathy.  Skin:    General: Skin is warm and dry.     Capillary Refill: Capillary refill takes less than 2  seconds.  Neurological:     General: No focal deficit present.     Mental Status: She is alert and oriented to person, place, and time.  Psychiatric:        Mood and Affect: Mood normal.        Behavior: Behavior normal.        Thought Content: Thought content normal.        Judgment: Judgment normal.         11/22/2021    9:30 AM 11/13/2021    1:36 PM 05/17/2021    2:51 PM 03/09/2021    6:50 AM 03/02/2021   11:57 AM 09/09/2020    8:52 AM 02/01/2019    6:46 AM  Advanced Directives  Does Patient Have a Medical Advance Directive? Yes No Yes Yes Yes Yes No  Type of Theatre stage manager of [place];Living will [place];Living will [place];Living will   Does patient want to make changes to medical advance directive?     No - Patient declined    Would patient like  information on creating a medical advance directive?       No - Patient declined    Current Medications (verified) Outpatient Encounter Medications as of 06/05/2022  Medication Sig   aspirin 81 MG EC tablet Take 162 mg by mouth daily. Swallow whole.   calcium-vitamin D (OSCAL WITH D) 500-200 MG-UNIT tablet Take 1 tablet by mouth 2 (two) times daily.   celecoxib (CELEBREX) 50 MG capsule Take 1 capsule (50 mg total) by mouth 2 (two) times daily between meals as needed for pain.   Cholecalciferol (VITAMIN D3) 1000 units CAPS Take 1,000 Units by mouth daily with lunch.    ferrous sulfate 324 MG TBEC Take 324 mg by mouth 2 (two) times a week.   furosemide (LASIX) 20 MG tablet TAKE 1 TABLET BY MOUTH ON MONDAY, WEDNESDAY AND FRIDAY   metoprolol tartrate (LOPRESSOR) 25 MG tablet Take 1/2 tablet (12.5 mg) by mouth twice a day   Multiple Vitamins-Minerals (ICAPS AREDS 2 PO) Take 1 capsule by mouth 2 (two) times daily.   nitroGLYCERIN (NITROSTAT) 0.4 MG SL tablet DISSOLVE ONE TABLET UNDER THE TONGUE EVERY 5 MINUTES AS NEEDED FOR CHEST PAIN.  DO NOT EXCEED A TOTAL OF 3 DOSES IN 15 MINUTES   ondansetron (ZOFRAN) 4 MG tablet Take 1 tablet (4 mg total) by mouth every 8 (eight) hours as needed for nausea or vomiting.   rosuvastatin (CRESTOR) 20 MG tablet Take 20 mg by mouth at bedtime.   vitamin B-12 (CYANOCOBALAMIN) 500 MCG tablet Take 1 tablet (500 mcg total) by mouth daily.   [DISCONTINUED] nitrofurantoin (MACRODANTIN) 100 MG capsule Take 100 mg by mouth daily.   nitrofurantoin (MACRODANTIN) 100 MG capsule Take 1 capsule (100 mg total) by mouth daily.   No facility-administered encounter medications on file as of 06/05/2022.    Allergies (verified) Latex, Lisinopril, Penicillins, Pravachol [pravastatin sodium], Alendronate sodium, Amoxicillin, Crestor [rosuvastatin], Other, Sulfamethoxazole-trimethoprim, Budesonide-formoterol fumarate, Bupropion, Codeine, Morphine and related, and Prozac [fluoxetine hcl]    History: Past Medical History:  Diagnosis Date   Anemia    Anxiety    Arthritis    "just about all over" (03/11/2017)   Asthma    Cervical cancer ([facility])    Chronic UTI    COPD (chronic obstructive pulmonary disease) ([facility])    Coronary artery disease    Depression    Heart murmur    Hip fracture (  [facility])    left hip   Hyperlipidemia    Low blood sugar    Migraine    "work related; quit when I quit work in ~ 1997" (03/11/2017)   PONV (postoperative nausea and vomiting)    Skin cancer    back   Past Surgical History:  Procedure Laterality Date   APPENDECTOMY     CARDIAC CATHETERIZATION  02/2017   CATARACT EXTRACTION W/ INTRAOCULAR LENS  IMPLANT, BILATERAL Bilateral    CORONARY ANGIOPLASTY WITH STENT PLACEMENT  03/11/2017   CORONARY STENT INTERVENTION N/A 03/11/2017   Procedure: Coronary Stent Intervention;  Surgeon: Sherren Mocha, MD;  Location: [facility];  Service: Cardiovascular;  Laterality: N/A;   IR RADIOLOGY PERIPHERAL GUIDED IV START  02/26/2017   IR US GUIDE VASC ACCESS RIGHT  02/26/2017   KNEE ARTHROSCOPY Right 03/09/2021   Procedure: RIGHT KNEE ARTHROSCOPY WITH PARTIAL MEDIAL AND LATERAL MENISCECTOMIES;  Surgeon: [name], MD;  Location: [facility];  Service: Orthopedics;  Laterality: Right;   RIGHT/LEFT HEART CATH AND CORONARY ANGIOGRAPHY N/A 02/20/2017   Procedure: Right/Left Heart Cath and Coronary Angiography;  Surgeon: Sherren Mocha, MD;  Location: [facility];  Service: Cardiovascular;  Laterality: N/A;   SKIN CANCER EXCISION     "back"   TEE WITHOUT CARDIOVERSION N/A 04/09/2017   Procedure: TRANSESOPHAGEAL ECHOCARDIOGRAM (TEE);  Surgeon: Sherren Mocha, MD;  Location: [facility];  Service: Open Heart Surgery;  Laterality: N/A;   TRANSCATHETER AORTIC VALVE REPLACEMENT, TRANSFEMORAL N/A 04/09/2017   Procedure: TRANSCATHETER AORTIC VALVE REPLACEMENT, TRANSFEMORAL;  Surgeon: Sherren Mocha, MD;  Location: [facility];  Service: Open Heart  Surgery;  Laterality: N/A;   VAGINAL HYSTERECTOMY     History reviewed. No pertinent family history. Social History   Socioeconomic History   Marital status: Widowed    Spouse name: Not on file   Number of children: 3   Years of education: Not on file   Highest education level: Not on file  Occupational History   Not on file  Tobacco Use   Smoking status: Never   Smokeless tobacco: Never  Vaping Use   Vaping Use: Never used  Substance and Sexual Activity   Alcohol use: No   Drug use: No   Sexual activity: Not Currently  Other Topics Concern   Not on file  Social History Narrative   Right handed    Lives alone   Social Determinants of Health   Financial Resource Strain: Not on file  Food Insecurity: Not on file  Transportation Needs: Not on file  Physical Activity: Not on file  Stress: Not on file  Social Connections: Not on file    Tobacco Counseling Patient is non smoker   Diabetic NO   Activities of Daily Living    04/05/2022   10:17 AM  In your present state of health, do you have any difficulty performing the following activities:  Hearing? 0  Vision? 1  Difficulty concentrating or making decisions? 1  Walking or climbing stairs? 1  Dressing or bathing? 0  Doing errands, shopping? 1    Patient Care Team: Ronnell Freshwater, NP as PCP - General (Family Medicine) Harlan Stains, MD (Family Medicine) Cameron Sprang, MD as Consulting Physician (Neurology) Rockey Situ [name], MD as Consulting Physician (Cardiology)  Indicate any recent Medical Services you may have received from other than [facility] providers in the past year (date may be approximate).     Assessment:  1. Encounter for Medicare  annual wellness exam Annual Medicare wellness visit today.  2. Essential hypertension Generally well managed.  Continue blood pressure medication as prescribed.  3. Primary osteoarthritis of both hands Trial Celebrex 50 mg capsules.  May take up to twice daily  as needed for pain and inflammation. - celecoxib (CELEBREX) 50 MG capsule; Take 1 capsule (50 mg total) by mouth 2 (two) times daily between meals as needed for pain.  Dispense: 60 capsule; Refill: 1  4. Estrogen deficiency Plan density test ordered today. - DG Bone Density; Future  5. Screening for osteoporosis Bone density test ordered today. - DG Bone Density; Future  6. Chronic urinary tract infection Restart Macrobid 100 mg daily to prevent chronic UTI. - nitrofurantoin (MACRODANTIN) 100 MG capsule; Take 1 capsule (100 mg total) by mouth daily.  Dispense: 90 capsule; Refill: 1    Depression Screen    04/05/2022   10:17 AM  PHQ 2/9 Scores  PHQ - 2 Score 0  PHQ- 9 Score 3    Fall Risk    11/22/2021    9:29 AM 05/17/2021    2:51 PM 09/09/2020    8:52 AM  Fall Risk   Falls in the past year? 1 0 0  Number falls in past yr: 1  0  Injury with Fall? 1  0    FALL RISK PREVENTION PERTAINING TO THE HOME:  Any stairs in or around the home? Yes  If so, are there any without handrails? Yes  Home free of loose throw rugs in walkways, pet beds, electrical cords, etc? Yes  Adequate lighting in your home to reduce risk of falls? Yes   ASSISTIVE DEVICES UTILIZED TO PREVENT FALLS:  Life alert? No  Use of a cane, walker or w/c? Yes  Grab bars in the bathroom? Yes  Shower chair or bench in shower? Yes  Elevated toilet seat or a handicapped toilet? Yes   TIMED UP AND GO:  Was the test performed? Yes .  Length of time to ambulate 10 feet: 30 sec.   Gait slow and steady with assistive device  Cognitive Function:    11/22/2021   12:00 PM  MMSE - Mini Mental State Exam  Orientation to time 5  Orientation to Place 5  Registration 3  Attention/ Calculation 2  Recall 2  Language- name 2 objects 2  Language- repeat 1  Language- follow 3 step command 3  Language- read & follow direction 1  Write a sentence 1  Copy design 1  Total score 26      09/09/2020    9:00 AM   Montreal Cognitive Assessment   Visuospatial/ Executive (0/5) 4  Naming (0/3) 2  Attention: Read list of digits (0/2) 1  Attention: Read list of letters (0/1) 0  Attention: Serial 7 subtraction starting at 100 (0/3) 0  Language: Repeat phrase (0/2) 2  Language : Fluency (0/1) 1  Abstraction (0/2) 2  Delayed Recall (0/5) 0  Orientation (0/6) 6  Total 18  Adjusted Score (based on education) 19      Immunizations Immunization History  Administered Date(s) Administered   Fluad Quad(high Dose 65+) 10/18/2020   Influenza,inj,Quad PF,6+ Mos 10/13/2019   PFIZER(Purple Top)SARS-COV-2 Vaccination 12/31/2019, 01/27/2020    TDAP status: Up to date  Flu Vaccine status: Up to date  Pneumococcal vaccine status: Up to date  Covid-19 vaccine status: Completed vaccines  Qualifies for Shingles Vaccine?  yes   Zostavax completed No   Shingrix Completed?: Yes  Screening Tests  Health Maintenance  Topic Date Due   Zoster Vaccines- Shingrix (1 of 2) Never done   Pneumonia Vaccine 55+ Years old (1 - PCV) Never done   DEXA SCAN  Never done   COVID-19 Vaccine (3 - Pfizer risk series) 02/24/2020   INFLUENZA VACCINE  06/05/2022   TETANUS/TDAP  11/26/2031   HPV VACCINES  Aged Out    Health Maintenance  Health Maintenance Due  Topic Date Due   Zoster Vaccines- Shingrix (1 of 2) Never done   Pneumonia Vaccine 61+ Years old (1 - PCV) Never done   DEXA SCAN  Never done   COVID-19 Vaccine (3 - Pfizer risk series) 02/24/2020   INFLUENZA VACCINE  06/05/2022    Colorectal cancer screening: No longer required.   Mammogram status: No longer required due to patient declines.  Bone Density status: Ordered 06/05/2022. Pt provided with contact info and advised to call to schedule appt.  Lung Cancer Screening: (Low Dose CT Chest recommended if Age 52-80 years, 30 pack-year currently smoking OR have quit w/in 15years.) does not qualify.   Lung Cancer Screening Referral: no  Additional  Screening:  Hepatitis C Screening: does not qualify; Completed n/a  Vision Screening: Recommended annual ophthalmology exams for early detection of glaucoma and other disorders of the eye. Is the patient up to date with their annual eye exam?  Yes  Who is the provider or what is the name of the office in which the patient attends annual eye exams? Dr Katy Fitch If pt is not established with a provider, would they like to be referred to a provider to establish care? No .   Dental Screening: Recommended annual dental exams for proper oral hygiene  Community Resource Referral / Chronic Care Management: CRR required this visit?  No   CCM required this visit?  No      Plan:     I have personally reviewed and noted the following in the patient's chart:   Medical and social history Use of alcohol, tobacco or illicit drugs  Current medications and supplements including opioid prescriptions.  Functional ability and status Nutritional status Physical activity Advanced directives List of other physicians Hospitalizations, surgeries, and ER visits in previous 12 months Vitals Screenings to include cognitive, depression, and falls Referrals and appointments  In addition, I have reviewed and discussed with patient certain preventive protocols, quality metrics, and best practice recommendations. A written personalized care plan for preventive services as well as general preventive health recommendations were provided to patient.     Ronnell Freshwater, NP   06/10/2022   Nurse Notes: Patient needs referral for DEXA scan

## 2022-06-10 DIAGNOSIS — E2839 Other primary ovarian failure: Secondary | ICD-10-CM | POA: Insufficient documentation

## 2022-06-10 DIAGNOSIS — Z13820 Encounter for screening for osteoporosis: Secondary | ICD-10-CM | POA: Insufficient documentation

## 2022-06-10 DIAGNOSIS — N390 Urinary tract infection, site not specified: Secondary | ICD-10-CM | POA: Insufficient documentation

## 2022-07-02 ENCOUNTER — Telehealth: Payer: Self-pay | Admitting: *Deleted

## 2022-07-02 NOTE — Patient Outreach (Addendum)
  Care Coordination   Initial Visit Note   07/02/2022 Name: Amy Kaiser MRN: [id number] DOB: 1929-07-25  Amy Kaiser is a 88 y.o. year old female who sees Boscia, Greer Ee, NP for primary care. I spoke with  Amy Kaiser by phone today.  What matters to the patients health and wellness today?  Planned Care Coordination call for tomorrow at 3:30    Goals Addressed             This Visit's Progress    "Get some help with a bath and washing my hair"       .care        SDOH assessments and interventions completed:  No  SDOH Interventions Today    Flowsheet Row Most Recent Value  SDOH Interventions   Food Insecurity Interventions Intervention Not Indicated  Housing Interventions Intervention Not Indicated  Social Connections Interventions Intervention Not Indicated        Care Coordination Interventions Activated:  Yes  Care Coordination Interventions:  Yes, provided   Follow up plan: Follow up call scheduled for 07/03/22    Encounter Outcome:  Pt. Scheduled

## 2022-07-03 ENCOUNTER — Encounter: Payer: Self-pay | Admitting: *Deleted

## 2022-07-24 ENCOUNTER — Encounter: Payer: Self-pay | Admitting: Nurse Practitioner

## 2022-07-24 ENCOUNTER — Ambulatory Visit (INDEPENDENT_AMBULATORY_CARE_PROVIDER_SITE_OTHER): Payer: Medicare Other | Admitting: Nurse Practitioner

## 2022-07-24 VITALS — BP 128/71 | HR 72 | Ht 67.0 in | Wt 157.0 lb

## 2022-07-24 DIAGNOSIS — I10 Essential (primary) hypertension: Secondary | ICD-10-CM

## 2022-07-24 DIAGNOSIS — M19041 Primary osteoarthritis, right hand: Secondary | ICD-10-CM | POA: Diagnosis not present

## 2022-07-24 DIAGNOSIS — E2839 Other primary ovarian failure: Secondary | ICD-10-CM

## 2022-07-24 DIAGNOSIS — E559 Vitamin D deficiency, unspecified: Secondary | ICD-10-CM

## 2022-07-24 DIAGNOSIS — M19042 Primary osteoarthritis, left hand: Secondary | ICD-10-CM

## 2022-07-24 DIAGNOSIS — J4530 Mild persistent asthma, uncomplicated: Secondary | ICD-10-CM

## 2022-07-24 DIAGNOSIS — R5383 Other fatigue: Secondary | ICD-10-CM

## 2022-07-24 DIAGNOSIS — Z0000 Encounter for general adult medical examination without abnormal findings: Secondary | ICD-10-CM

## 2022-07-24 MED ORDER — CELECOXIB 100 MG PO CAPS: 100.0000 mg | ORAL_CAPSULE | Freq: Two times a day (BID) | ORAL | 1 refills | Status: DC | PRN

## 2022-07-24 NOTE — Progress Notes (Signed)
Established patient visit   Patient: Amy Kaiser   DOB: [birth year]   88 y.o. Female  MRN: [id number] Visit Date: 07/24/2022   Chief Complaint  Patient presents with   Follow-up   Subjective    HPI  Follow up -will have routine, fasting labs  -blood pressure elevated today. She has fasted. She has not taken any of her medications, including BP medication today.  -slight cough. Likely to be allergies. Has been cleaning and exposed to much dust.  -decreased appetite. Has been stressed. Has lost two close family members in past 5 months;.  -she is supplementing her diet with high protein shakes to help prevent her from losing.  -having significant arthritic pain in her hands. Started low dose celebrex 50 mg up to twice daily when needed. She states that the celebrex did help some but could be much better. Having trouble with grips and hand srength due to the arthritis pain in her hands.   Medications: Outpatient Medications Prior to Visit  Medication Sig   aspirin 81 MG EC tablet Take 162 mg by mouth daily. Swallow whole.   calcium-vitamin D (OSCAL WITH D) 500-200 MG-UNIT tablet Take 1 tablet by mouth 2 (two) times daily.   Cholecalciferol (VITAMIN D3) 1000 units CAPS Take 1,000 Units by mouth daily with lunch.    ferrous sulfate 324 MG TBEC Take 324 mg by mouth 2 (two) times a week.   furosemide (LASIX) 20 MG tablet TAKE 1 TABLET BY MOUTH ON MONDAY, WEDNESDAY AND FRIDAY   metoprolol tartrate (LOPRESSOR) 25 MG tablet Take 1/2 tablet (12.5 mg) by mouth twice a day   Multiple Vitamins-Minerals (ICAPS AREDS 2 PO) Take 1 capsule by mouth 2 (two) times daily.   nitrofurantoin (MACRODANTIN) 100 MG capsule Take 1 capsule (100 mg total) by mouth daily.   nitroGLYCERIN (NITROSTAT) 0.4 MG SL tablet DISSOLVE ONE TABLET UNDER THE TONGUE EVERY 5 MINUTES AS NEEDED FOR CHEST PAIN.  DO NOT EXCEED A TOTAL OF 3 DOSES IN 15 MINUTES   ondansetron (ZOFRAN) 4 MG tablet Take 1 tablet (4 mg total) by mouth  every 8 (eight) hours as needed for nausea or vomiting.   rosuvastatin (CRESTOR) 20 MG tablet Take 20 mg by mouth at bedtime.   vitamin B-12 (CYANOCOBALAMIN) 500 MCG tablet Take 1 tablet (500 mcg total) by mouth daily.   [DISCONTINUED] celecoxib (CELEBREX) 50 MG capsule Take 1 capsule (50 mg total) by mouth 2 (two) times daily between meals as needed for pain.   No facility-administered medications prior to visit.    Review of Systems  Constitutional:  Positive for fatigue. Negative for activity change, appetite change, chills and fever.  HENT:  Negative for congestion, postnasal drip, rhinorrhea, sinus pressure, sinus pain, sneezing and sore throat.   Eyes: Negative.   Respiratory:  Negative for cough, chest tightness, shortness of breath and wheezing.   Cardiovascular:  Negative for chest pain and palpitations.  Gastrointestinal:  Negative for abdominal pain, constipation, diarrhea, nausea and vomiting.  Endocrine: Negative for cold intolerance, heat intolerance, polydipsia and polyuria.  Genitourinary:  Negative for dyspareunia, dysuria, flank pain, frequency and urgency.  Musculoskeletal:  Positive for arthralgias and myalgias. Negative for back pain.  Skin:  Negative for rash.  Allergic/Immunologic: Negative for environmental allergies.  Neurological:  Negative for dizziness, weakness and headaches.  Hematological:  Negative for adenopathy.  Psychiatric/Behavioral:  The patient is nervous/anxious.        Objective     Today's Vitals  07/24/22 1100 07/24/22 1124  BP: (Abnormal) 154/74 128/71  Pulse: 72   SpO2: 95%   Weight: 157 lb (71.2 kg)   Height: 5' 7" (1.702 m)    Body mass index is 24.59 kg/m.   BP Readings from Last 3 Encounters:  07/24/22 128/71  06/05/22 (Abnormal) 152/84  04/12/22 (Abnormal) 177/62    Wt Readings from Last 3 Encounters:  07/24/22 157 lb (71.2 kg)  06/05/22 158 lb (71.7 kg)  04/12/22 160 lb 1.6 oz (72.6 kg)    Physical Exam Vitals and  nursing note reviewed.  Constitutional:      Appearance: Normal appearance. She is well-developed.  HENT:     Head: Normocephalic and atraumatic.     Nose: Nose normal.     Mouth/Throat:     Mouth: Mucous membranes are moist.     Pharynx: Oropharynx is clear.  Eyes:     Extraocular Movements: Extraocular movements intact.     Conjunctiva/sclera: Conjunctivae normal.     Pupils: Pupils are equal, round, and reactive to light.  Neck:     Vascular: No carotid bruit.  Cardiovascular:     Rate and Rhythm: Normal rate and regular rhythm.     Pulses: Normal pulses.     Heart sounds: Normal heart sounds.  Pulmonary:     Effort: Pulmonary effort is normal.     Breath sounds: Normal breath sounds.  Abdominal:     Palpations: Abdomen is soft.  Musculoskeletal:        General: Normal range of motion.     Cervical back: Normal range of motion and neck supple.     Comments: Generalized joint  pain, most severe in the hands. There is considerable joint swelling of the metacarpal joints of both hands. ROM and grip strength  are mildly diminished, bilaterally    Lymphadenopathy:     Cervical: No cervical adenopathy.  Skin:    General: Skin is warm and dry.     Capillary Refill: Capillary refill takes less than 2 seconds.  Neurological:     General: No focal deficit present.     Mental Status: She is alert and oriented to person, place, and time.  Psychiatric:        Mood and Affect: Mood normal.        Behavior: Behavior normal.        Thought Content: Thought content normal.        Judgment: Judgment normal.      Results for orders placed or performed in visit on 07/24/22  Lipid panel  Result Value Ref Range   Cholesterol, Total 141 100 - 199 mg/dL   Triglycerides 131 0 - 149 mg/dL   HDL 41 >39 mg/dL   VLDL Cholesterol Cal 23 5 - 40 mg/dL   LDL Chol Calc (NIH) 77 0 - 99 mg/dL   Chol/HDL Ratio 3.4 0.0 - 4.4 ratio  TSH  Result Value Ref Range   TSH 3.230 0.450 - 4.500 uIU/mL   T4, free  Result Value Ref Range   Free T4 1.15 0.82 - 1.77 ng/dL  Comprehensive metabolic panel  Result Value Ref Range   Glucose 99 70 - 99 mg/dL   BUN 18 10 - 36 mg/dL   Creatinine, Ser 1.03 (H) 0.57 - 1.00 mg/dL   eGFR 51 (L) >59 mL/min/1.73   BUN/Creatinine Ratio 17 12 - 28   Sodium 139 134 - 144 mmol/L   Potassium 4.7 3.5 - 5.2 mmol/L   Chloride  101 96 - 106 mmol/L   CO2 23 20 - 29 mmol/L   Calcium 9.0 8.7 - 10.3 mg/dL   Total Protein 7.5 6.0 - 8.5 g/dL   Albumin 3.9 3.6 - 4.6 g/dL   Globulin, Total 3.6 1.5 - 4.5 g/dL   Albumin/Globulin Ratio 1.1 (L) 1.2 - 2.2   Bilirubin Total 0.6 0.0 - 1.2 mg/dL   Alkaline Phosphatase 87 44 - 121 IU/L   AST 34 0 - 40 IU/L   ALT 10 0 - 32 IU/L  CBC with Differential/Platelet  Result Value Ref Range   WBC 8.5 3.4 - 10.8 x10E3/uL   RBC 3.40 (L) 3.77 - 5.28 x10E6/uL   Hemoglobin 10.2 (L) 11.1 - 15.9 g/dL   Hematocrit 31.4 (L) 34.0 - 46.6 %   MCV 92 79 - 97 fL   MCH 30.0 26.6 - 33.0 pg   MCHC 32.5 31.5 - 35.7 g/dL   RDW 13.2 11.7 - 15.4 %   Platelets 215 150 - 450 x10E3/uL   Neutrophils 52 Not Estab. %   Lymphs 34 Not Estab. %   Monocytes 9 Not Estab. %   Eos 4 Not Estab. %   Basos 1 Not Estab. %   Neutrophils Absolute 4.4 1.4 - 7.0 x10E3/uL   Lymphocytes Absolute 2.9 0.7 - 3.1 x10E3/uL   Monocytes Absolute 0.8 0.1 - 0.9 x10E3/uL   EOS (ABSOLUTE) 0.3 0.0 - 0.4 x10E3/uL   Basophils Absolute 0.1 0.0 - 0.2 x10E3/uL   Immature Granulocytes 0 Not Estab. %   Immature Grans (Abs) 0.0 0.0 - 0.1 x10E3/uL  Hemoglobin A1c  Result Value Ref Range   Hgb A1c MFr Bld 6.1 (H) 4.8 - 5.6 %   Est. average glucose Bld gHb Est-mCnc 128 mg/dL    Assessment & Plan    1. Essential hypertension Stable blood pressure.  Continue Lopressor and furosemide as previously prescribed. - Hemoglobin A1c - Lipid panel - TSH - T4, free - Comprehensive metabolic panel - CBC with Differential/Platelet  2. Primary osteoarthritis of both hands Improved  with Celebrex.  Increase dose of Celebrex 100 mg twice daily as needed.  Reassess in 3 months.  - celecoxib (CELEBREX) 100 MG capsule; Take 1 capsule (100 mg total) by mouth 2 (two) times daily between meals as needed for pain.  Dispense: 60 capsule; Refill: 1  3. Mild persistent asthma without complication Stable. - Lipid panel - TSH - T4, free - Comprehensive metabolic panel - CBC with Differential/Platelet  4. Healthcare maintenance Routine, fasting labs checked during today's visit. - Hemoglobin A1c - Lipid panel - TSH - T4, free - Comprehensive metabolic panel - CBC with Differential/Platelet   Problem List Items Addressed This Visit       Cardiovascular and Mediastinum   Essential hypertension - Primary     Respiratory   Mild persistent asthma     Musculoskeletal and Integument   Primary osteoarthritis of both hands   Relevant Medications   celecoxib (CELEBREX) 100 MG capsule     Other   Vitamin D deficiency   Estrogen deficiency   Other Visit Diagnoses     Other fatigue       Healthcare maintenance            Return in about 3 months (around 10/23/2022) for blood pressure.          E , NP  [facility] [phone] (phone) [phone] (fax)  [facility]  

## 2022-07-25 ENCOUNTER — Other Ambulatory Visit: Payer: Medicare Other

## 2022-07-25 DIAGNOSIS — I10 Essential (primary) hypertension: Secondary | ICD-10-CM | POA: Diagnosis not present

## 2022-07-25 DIAGNOSIS — Z0000 Encounter for general adult medical examination without abnormal findings: Secondary | ICD-10-CM | POA: Diagnosis not present

## 2022-07-25 DIAGNOSIS — J4530 Mild persistent asthma, uncomplicated: Secondary | ICD-10-CM | POA: Diagnosis not present

## 2022-07-26 LAB — COMPREHENSIVE METABOLIC PANEL
ALT: 10 IU/L (ref 0–32)
AST: 34 IU/L (ref 0–40)
Albumin/Globulin Ratio: 1.1 — ABNORMAL LOW (ref 1.2–2.2)
Albumin: 3.9 g/dL (ref 3.6–4.6)
Alkaline Phosphatase: 87 IU/L (ref 44–121)
BUN/Creatinine Ratio: 17 (ref 12–28)
BUN: 18 mg/dL (ref 10–36)
Bilirubin Total: 0.6 mg/dL (ref 0.0–1.2)
CO2: 23 mmol/L (ref 20–29)
Calcium: 9 mg/dL (ref 8.7–10.3)
Chloride: 101 mmol/L (ref 96–106)
Creatinine, Ser: 1.03 mg/dL — ABNORMAL HIGH (ref 0.57–1.00)
Globulin, Total: 3.6 g/dL (ref 1.5–4.5)
Glucose: 99 mg/dL (ref 70–99)
Potassium: 4.7 mmol/L (ref 3.5–5.2)
Sodium: 139 mmol/L (ref 134–144)
Total Protein: 7.5 g/dL (ref 6.0–8.5)
eGFR: 51 mL/min/{1.73_m2} — ABNORMAL LOW (ref >59)

## 2022-07-26 LAB — T4, FREE: Free T4: 1.15 ng/dL (ref 0.82–1.77)

## 2022-07-26 LAB — HEMOGLOBIN A1C
Est. average glucose Bld gHb Est-mCnc: 128 mg/dL
Hgb A1c MFr Bld: 6.1 % — ABNORMAL HIGH (ref 4.8–5.6)

## 2022-07-26 LAB — LIPID PANEL
Chol/HDL Ratio: 3.4 ratio (ref 0.0–4.4)
Cholesterol, Total: 141 mg/dL (ref 100–199)
HDL: 41 mg/dL (ref >39)
LDL Chol Calc (NIH): 77 mg/dL (ref 0–99)
Triglycerides: 131 mg/dL (ref 0–149)
VLDL Cholesterol Cal: 23 mg/dL (ref 5–40)

## 2022-07-26 LAB — CBC WITH DIFFERENTIAL/PLATELET
Basophils Absolute: 0.1 10*3/uL (ref 0.0–0.2)
Basos: 1 %
EOS (ABSOLUTE): 0.3 10*3/uL (ref 0.0–0.4)
Eos: 4 %
Hematocrit: 31.4 % — ABNORMAL LOW (ref 34.0–46.6)
Hemoglobin: 10.2 g/dL — ABNORMAL LOW (ref 11.1–15.9)
Immature Grans (Abs): 0 10*3/uL (ref 0.0–0.1)
Immature Granulocytes: 0 %
Lymphocytes Absolute: 2.9 10*3/uL (ref 0.7–3.1)
Lymphs: 34 %
MCH: 30 pg (ref 26.6–33.0)
MCHC: 32.5 g/dL (ref 31.5–35.7)
MCV: 92 fL (ref 79–97)
Monocytes Absolute: 0.8 10*3/uL (ref 0.1–0.9)
Monocytes: 9 %
Neutrophils Absolute: 4.4 10*3/uL (ref 1.4–7.0)
Neutrophils: 52 %
Platelets: 215 10*3/uL (ref 150–450)
RBC: 3.4 x10E6/uL — ABNORMAL LOW (ref 3.77–5.28)
RDW: 13.2 % (ref 11.7–15.4)
WBC: 8.5 10*3/uL (ref 3.4–10.8)

## 2022-07-26 LAB — TSH: TSH: 3.23 u[IU]/mL (ref 0.450–4.500)

## 2022-08-03 DIAGNOSIS — N3020 Other chronic cystitis without hematuria: Secondary | ICD-10-CM | POA: Diagnosis not present

## 2022-08-03 DIAGNOSIS — R350 Frequency of micturition: Secondary | ICD-10-CM | POA: Diagnosis not present

## 2022-08-20 ENCOUNTER — Encounter: Payer: Self-pay | Admitting: Nurse Practitioner

## 2022-08-27 ENCOUNTER — Telehealth: Payer: Self-pay

## 2022-08-27 NOTE — Telephone Encounter (Signed)
Pt daughter is calling requesting a referral to a Dentist that specializes in dental work for heart pts.  Pt has a tooth broken in the back and its making it hard to eat and swallow.   Please advise

## 2022-08-27 NOTE — Telephone Encounter (Signed)
I don't believe we do referrals to dentists. They have to call around to see about who takes her dental insurance and if they work with heart patients.

## 2022-08-27 NOTE — Telephone Encounter (Signed)
Called pt she is advised that she will have to call and see who accepts her insurance and her condition

## 2022-08-30 ENCOUNTER — Ambulatory Visit: Payer: Self-pay | Admitting: *Deleted

## 2022-09-04 ENCOUNTER — Telehealth: Payer: Self-pay | Admitting: *Deleted

## 2022-09-04 DIAGNOSIS — I25118 Atherosclerotic heart disease of native coronary artery with other forms of angina pectoris: Secondary | ICD-10-CM

## 2022-09-04 NOTE — Patient Outreach (Addendum)
  Care Coordination   Initial Visit Note   09/04/2022 Late Entry Name: MARILYNN EKSTEIN MRN: [id number] DOB: [birth year]  Joetta Manners is a 88 y.o. year old female who sees Boscia, Greer Ee, NP for primary care. I spoke with  Joetta Manners by phone 08/30/22.  What matters to the patients health and wellness today?  Help with bathing-    Goals Addressed             This Visit's Progress    "Get some help with a bath and washing my hair"       Care Coordination Interventions: Reviewed Care Coordination Services:  Assessed Social Determinants of Health Made referral to Care Guide to assist with: Community resources to consider and see if eligible for in-home assistance Discussed with pt potential programs (DSS Aide program, Veteran's program, etc) that may be able to help her with  "bathing and someone to wash my hair", life alert options Pt shares she lives alone; has ahd some falls and has a granddaughter staying with her at night due to the falls.   Pt reports her Left hand does not close well and thus she struggles to bathe independently Pt declines interest in moving to ALF, SNF, etc          SDOH assessments and interventions completed:  Yes     Care Coordination Interventions Activated:  Yes  Care Coordination Interventions:  Yes, provided   Follow up plan: Referral made to [facility] Follow up call scheduled for 09/13/22    Encounter Outcome:  Pt. Visit Completed

## 2022-09-04 NOTE — Patient Instructions (Signed)
Visit Information  Thank you for taking time to visit with me today. Please don't hesitate to contact me if I can be of assistance to you.   Following are the goals we discussed today:   Goals Addressed             This Visit's Progress    "Get some help with a bath and washing my hair"       Care Coordination Interventions: Reviewed Care Coordination Services:  Assessed Social Determinants of Health Made referral to Care Guide to assist with: Community resources to consider and see if eligible for in-home assistance Discussed with pt potential programs (DSS Aide program, Veteran's program, etc) that may be able to help her with  "bathing and someone to wash my hair", life alert options Pt shares she lives alone; has ahd some falls and has a granddaughter staying with her at night due to the falls.   Pt reports her Left hand does not close well and thus she struggles to bathe independently Pt declines interest in moving to ALF, SNF, etc          Our next appointment is by telephone on 09/13/22 Please call the care guide team at [phone] if you need to cancel or reschedule your appointment.   If you are experiencing a Mental Health or [place] or need someone to talk to, please call the Suicide and Crisis Lifeline: 988 call 911   The patient verbalized understanding of instructions, educational materials, and care plan provided today and DECLINED offer to receive copy of patient instructions, educational materials, and care plan.   Telephone follow up appointment with care management team member scheduled for:09/13/22  Eduard Clos MSW, LCSW Licensed Clinical Social Worker      [phone]

## 2022-09-05 ENCOUNTER — Telehealth: Payer: Self-pay | Admitting: *Deleted

## 2022-09-05 NOTE — Telephone Encounter (Signed)
   Telephone encounter was:  Successful.  09/05/2022 Name: Amy Kaiser MRN: [id number] DOB: 02-25-1929  Amy Kaiser is a 88 y.o. year old female who is a primary care patient of Ronnell Freshwater, NP . The community resource team was consulted for assistance with [place] program, Veteran's Aide Program, and [place] on Aging for personal care support also veterans bridge information  Care guide performed the following interventions: Patient provided with information about care guide support team and interviewed to confirm resource needs.  Follow Up Plan:  No further follow up planned at this time. The patient has been provided with needed resources.  [place] [phone] [address] , [place] [address] Email : [email or web address]

## 2022-09-13 ENCOUNTER — Ambulatory Visit: Payer: Self-pay | Admitting: *Deleted

## 2022-09-13 NOTE — Patient Instructions (Signed)
Visit Information  Thank you for taking time to visit with me today. Please don't hesitate to contact me if I can be of assistance to you.   Following are the goals we discussed today:   Goals Addressed             This Visit's Progress    "Get some help with a bath and washing my hair"       Care Coordination Interventions:  Pt reports receiving the resources mailed to her- states she can't understand it and wants her granddaughter who is living with her to review- pt has CSW # and requests they call back when its convenient to further discuss. Reviewed Care Coordination Services:  Assessed Social Determinants of Health Previously discussed with pt potential programs (DSS Aide program, Veteran's program, etc) that may be able to help her with  "bathing and someone to wash my hair", life alert options. Pt shares she lives alone; has had some falls and has a granddaughter staying with her at night due to the falls. Pt reports her Left hand does not close well and thus she struggles to bathe independently Pt declines interest in moving to ALF, SNF, etc Will await their call back and/or follow up in 10-14 days if no outreach from them received.          Our next appointment is by telephone- you are to call me [phone] when convenient to discuss resources sent with you and family. I will call you back 09/24/22 if I don't hear from you by then    Please call the care guide team at [phone] if you need to cancel or reschedule your appointment.   If you are experiencing a Mental Health or [place] or need someone to talk to, please call the Suicide and Crisis Lifeline: 988 call 911   The patient verbalized understanding of instructions, educational materials, and care plan provided today and DECLINED offer to receive copy of patient instructions, educational materials, and care plan.   Telephone follow up appointment with care management team member scheduled  for: The patient will call Social Worker back when convenient to talk with granddaughter too.   Eduard Clos MSW, LCSW Licensed Clinical Social Worker      [phone]

## 2022-09-13 NOTE — Patient Outreach (Signed)
  Care Coordination   Follow Up Visit Note   09/13/2022 Name: Amy Kaiser MRN: [id number] DOB: [birth year]  Amy Kaiser is a 88 y.o. year old female who sees Boscia, Greer Ee, NP for primary care. I spoke with  Amy Kaiser by phone today.  What matters to the patients health and wellness today?  Getting some help at home.    Goals Addressed             This Visit's Progress    "Get some help with a bath and washing my hair"       Care Coordination Interventions:  Pt reports receiving the resources mailed to her- states she can't understand it and wants her granddaughter who is living with her to review- pt has CSW # and requests they call back when its convenient to further discuss. Reviewed Care Coordination Services:  Assessed Social Determinants of Health Previously discussed with pt potential programs (DSS Aide program, Veteran's program, etc) that may be able to help her with  "bathing and someone to wash my hair", life alert options. Pt shares she lives alone; has had some falls and has a granddaughter staying with her at night due to the falls. Pt reports her Left hand does not close well and thus she struggles to bathe independently Pt declines interest in moving to ALF, SNF, etc Will await their call back and/or follow up in 10-14 days if no outreach from them received.          SDOH assessments and interventions completed:  Yes     Care Coordination Interventions Activated:  Yes  Care Coordination Interventions:  Yes, provided   Follow up plan: Follow up call scheduled for 09/24/22    Encounter Outcome:  Pt. Visit Completed

## 2022-09-24 ENCOUNTER — Ambulatory Visit: Payer: Self-pay | Admitting: *Deleted

## 2022-09-25 NOTE — Patient Instructions (Signed)
Visit Information  Thank you for taking time to visit with me today. Please don't hesitate to contact me if I can be of assistance to you.   Following are the goals we discussed today:   Goals Addressed             This Visit's Progress    "Get some help with a bath and washing my hair"       Care Coordination Interventions:  CSW follow up call regarding resources sent for possible in-home assistance. Pt again reports receiving the resources mailed to her- wants me to talk to her granddaughter who is living with her to review- CSW discussed with granddaughter, [name] and provided CSW #  CSW encouraged them to review resources and CSW will call back Reviewed Care Coordination Services:  Assessed Social Determinants of Health Previously discussed with pt potential programs (DSS Aide program, Veteran's program, etc) that may be able to help her with  "bathing and someone to wash my hair", life alert options. Pt shares she lives alone; has had some falls and has a granddaughter staying with her at night due to the falls. Pt reports her Left hand does not close well and thus she struggles to bathe independently Pt declines interest in moving to ALF, SNF, etc Will await their call back and/or follow up in 10-14 days if no outreach from them received.          Our next appointment is by telephone on 10/22/22   Please call the care guide team at [phone] if you need to cancel or reschedule your appointment.   If you are experiencing a Mental Health or [place] or need someone to talk to, please call the Suicide and Crisis Lifeline: 988 call the [place] National Suicide Prevention Lifeline: 831 078 2400 or TTY: (850)048-3800 TTY 719 270 2524) to talk to a trained counselor call 911      The patient verbalized understanding of instructions, educational materials, and care plan provided today and DECLINED offer to receive copy of patient instructions, educational  materials, and care plan.   Telephone follow up appointment with care management team member scheduled for: 10/22/22  Eduard Clos MSW, LCSW Licensed Clinical Social Worker      [phone]

## 2022-09-25 NOTE — Patient Outreach (Signed)
  Care Coordination   Follow Up Visit Note   09/25/2022 Name: Amy Kaiser MRN: [id number] DOB: November 09, 1928  Amy Kaiser is a 88 y.o. year old female who sees Boscia, Greer Ee, NP for primary care. I spoke with  Amy Kaiser by phone today.  What matters to the patients health and wellness today?  Need assistance in home.    Goals Addressed             This Visit's Progress    "Get some help with a bath and washing my hair"       Care Coordination Interventions:  CSW follow up call regarding resources sent for possible in-home assistance. Pt again reports receiving the resources mailed to her- wants me to talk to her granddaughter who is living with her to review- CSW discussed with granddaughter, Amy Kaiser and provided CSW #  CSW encouraged them to review resources and CSW will call back Reviewed Care Coordination Services:  Assessed Social Determinants of Health Previously discussed with pt potential programs (DSS Aide program, Veteran's program, etc) that may be able to help her with  "bathing and someone to wash my hair", life alert options. Pt shares she lives alone; has had some falls and has a granddaughter staying with her at night due to the falls. Pt reports her Left hand does not close well and thus she struggles to bathe independently Pt declines interest in moving to ALF, SNF, etc Will await their call back and/or follow up in 10-14 days if no outreach from them received.          SDOH assessments and interventions completed:  Yes     Care Coordination Interventions Activated:  Yes  Care Coordination Interventions:  Yes, provided   Follow up plan: Follow up call scheduled for 10/1822    Encounter Outcome:  Pt. Visit Completed

## 2022-10-02 ENCOUNTER — Ambulatory Visit (INDEPENDENT_AMBULATORY_CARE_PROVIDER_SITE_OTHER): Payer: Medicare Other | Admitting: Nurse Practitioner

## 2022-10-02 ENCOUNTER — Telehealth: Payer: Self-pay | Admitting: Cardiovascular Disease

## 2022-10-02 VITALS — BP 139/62 | HR 62 | Ht 67.0 in | Wt 157.0 lb

## 2022-10-02 DIAGNOSIS — Z23 Encounter for immunization: Secondary | ICD-10-CM | POA: Diagnosis not present

## 2022-10-02 NOTE — Telephone Encounter (Signed)
 *  STAT* If patient is at the pharmacy, call can be transferred to refill team.   1. Which medications need to be refilled? (please list name of each medication and dose if known)   metoprolol tartrate (LOPRESSOR) 25 MG tablet    2. Which pharmacy/location (including street and city if local pharmacy) is medication to be sent to? [facility], [address]   3. Do they need a 30 day or 90 day supply? 90 days

## 2022-10-02 NOTE — Progress Notes (Signed)
Patient is here for her Flu shot patient tolerated w/o complication

## 2022-10-02 NOTE — Telephone Encounter (Signed)
Please contact patient for 1 year follow up appt.  Last seen 10-02-21. Refill request pending appt.

## 2022-10-04 NOTE — Progress Notes (Signed)
Cardiology Office Note  Date:  10/05/2022   ID:  Amy Kaiser, DOB 06/26/1929, MRN [id number]  PCP:  Ronnell Freshwater, NP   Chief Complaint  Patient presents with   12 month follow up     "Doing well." Medications reviewed by the patient verbally.     HPI:  Amy Kaiser is a 88 y.o. female with PMH of  severe aortic stenosis with progressive symptoms of diastolic heart failure  TAVR with a 20 mm Sapien 3 valve via a percutaneous transfemoral approach 04/09/2017.   severe stenosis of the proximal RCA  stenting with a 4 mm drug-eluting stent 03/11/2017.  Who presents for f/u of her TAVR  LOV 11/22  In follow-up today presents with family Family current living with her Overall she reports feeling well with no significant complaints  Denies significant shortness of breath, no leg edema, no PND orthopnea Continues to take Lasix every other day Rare palpitations lasting several seconds at a time  Poor appetite, Corn bread and milk  Takes care of house,   Echo 12/2021 Echocardiogram Normal left ventricle function Moderate pulmonary hypertension Moderate, possibly moderate to severe mitral valve regurgitation TAVR valve functioning well  Lab work reviewed A1c 6.1 HGB 10.1 CR 1.03 Total chol 141  EKG personally reviewed by myself on todays visit Shows normal sinus rhythm rate 53 bpm left axis deviation no other significant ST or T wave changes  Other past medical history reviewed Hallucinations last month Seen by neurology memory changes several years ago, worse in the past 2-3 years MRI brain shows diffuse atrophy, chronic microvascular disease. Symptoms suggestive of Alzheimer's disease with behavioral disturbance per neuro  Carotid: Nonobstructive Reports echo was done: not available Reports it was done through [facility] imaging, no report or images available for review  echocardiogram  moderately elevated right heart pressures, normal prosthetic aortic  valve, moderate MR and ejection fraction 60%   PMH:   has a past medical history of Anemia, Anxiety, Arthritis, Asthma, Cervical cancer ([place]), Chronic UTI, COPD (chronic obstructive pulmonary disease) ([place]), Coronary artery disease, Depression, Heart murmur, Hip fracture ([place]), Hyperlipidemia, Low blood sugar, Migraine, PONV (postoperative nausea and vomiting), and Skin cancer.  PSH:    Past Surgical History:  Procedure Laterality Date   APPENDECTOMY     CARDIAC CATHETERIZATION  02/2017   CATARACT EXTRACTION W/ INTRAOCULAR LENS  IMPLANT, BILATERAL Bilateral    CORONARY ANGIOPLASTY WITH STENT PLACEMENT  03/11/2017   CORONARY STENT INTERVENTION N/A 03/11/2017   Procedure: Coronary Stent Intervention;  Surgeon: Sherren Mocha, MD;  Location: [place] [facility];  Service: Cardiovascular;  Laterality: N/A;   IR RADIOLOGY PERIPHERAL GUIDED IV START  02/26/2017   IR US GUIDE VASC ACCESS RIGHT  02/26/2017   KNEE ARTHROSCOPY Right 03/09/2021   Procedure: RIGHT KNEE ARTHROSCOPY WITH PARTIAL MEDIAL AND LATERAL MENISCECTOMIES;  Surgeon: [name], MD;  Location: [place];  Service: Orthopedics;  Laterality: Right;   RIGHT/LEFT HEART CATH AND CORONARY ANGIOGRAPHY N/A 02/20/2017   Procedure: Right/Left Heart Cath and Coronary Angiography;  Surgeon: Sherren Mocha, MD;  Location: [place] [facility];  Service: Cardiovascular;  Laterality: N/A;   SKIN CANCER EXCISION     "back"   TEE WITHOUT CARDIOVERSION N/A 04/09/2017   Procedure: TRANSESOPHAGEAL ECHOCARDIOGRAM (TEE);  Surgeon: Sherren Mocha, MD;  Location: [place];  Service: Open Heart Surgery;  Laterality: N/A;   TRANSCATHETER AORTIC VALVE REPLACEMENT, TRANSFEMORAL N/A 04/09/2017   Procedure: TRANSCATHETER AORTIC VALVE REPLACEMENT, TRANSFEMORAL;  Surgeon: Sherren Mocha, MD;  Location: [place];  Service: Open Heart Surgery;  Laterality: N/A;   VAGINAL HYSTERECTOMY      Current Outpatient Medications  Medication Sig Dispense  Refill   albuterol (VENTOLIN HFA) 108 (90 Base) MCG/ACT inhaler 2 puffs as needed     aspirin 81 MG EC tablet Take 162 mg by mouth daily. Swallow whole.     calcium-vitamin D (OSCAL WITH D) 500-200 MG-UNIT tablet Take 1 tablet by mouth 2 (two) times daily.     celecoxib (CELEBREX) 100 MG capsule Take 1 capsule (100 mg total) by mouth 2 (two) times daily between meals as needed for pain. 60 capsule 1   Cholecalciferol (VITAMIN D3) 1000 units CAPS Take 1,000 Units by mouth daily with lunch.      ferrous sulfate 324 MG TBEC Take 324 mg by mouth 2 (two) times a week.     furosemide (LASIX) 20 MG tablet TAKE 1 TABLET BY MOUTH ON MONDAY, WEDNESDAY AND FRIDAY 38 tablet 2   metoprolol tartrate (LOPRESSOR) 25 MG tablet Take 1/2 tablet (12.5 mg) by mouth twice a day 90 tablet 3   Multiple Vitamins-Minerals (ICAPS AREDS 2 PO) Take 1 capsule by mouth 2 (two) times daily.     nitrofurantoin (MACRODANTIN) 100 MG capsule Take 1 capsule (100 mg total) by mouth daily. 90 capsule 1   nitroGLYCERIN (NITROSTAT) 0.4 MG SL tablet DISSOLVE ONE TABLET UNDER THE TONGUE EVERY 5 MINUTES AS NEEDED FOR CHEST PAIN.  DO NOT EXCEED A TOTAL OF 3 DOSES IN 15 MINUTES 25 tablet 0   ondansetron (ZOFRAN) 4 MG tablet Take 1 tablet (4 mg total) by mouth every 8 (eight) hours as needed for nausea or vomiting. 20 tablet 0   rosuvastatin (CRESTOR) 20 MG tablet Take 20 mg by mouth at bedtime.     vitamin B-12 (CYANOCOBALAMIN) 500 MCG tablet Take 1 tablet (500 mcg total) by mouth daily. 90 tablet 0   No current facility-administered medications for this visit.    Allergies:   Latex, Lisinopril, Penicillins, Pravachol [pravastatin sodium], Pravastatin, Alendronate sodium, Amoxicillin, Other, Propoxyphene, Rosuvastatin, Sulfamethoxazole-trimethoprim, Budesonide-formoterol fumarate, Bupropion, Codeine, Fluoxetine, Morphine, Morphine and related, and Prozac [fluoxetine hcl]   Social History:  The patient  reports that she has never smoked. She  has never used smokeless tobacco. She reports that she does not drink alcohol and does not use drugs.   Family History:   family history is not on file.    Review of Systems: Review of Systems  Constitutional: Negative.   Respiratory: Negative.    Cardiovascular: Negative.   Gastrointestinal: Negative.   Musculoskeletal: Negative.   Neurological: Negative.   Psychiatric/Behavioral: Negative.    All other systems reviewed and are negative.   PHYSICAL EXAM: VS:  BP 116/80 (BP Location: Left Arm, Patient Position: Sitting, Cuff Size: Normal)   Pulse (!) 53   Ht '5\' 6"'$  (1.676 m)   Wt 156 lb (70.8 kg)   SpO2 96%   BMI 25.18 kg/m  , BMI Body mass index is 25.18 kg/m. Constitutional:  oriented to person, place, and time. No distress.  HENT:  Head: Grossly normal Eyes:  no discharge. No scleral icterus.  Neck: No JVD, no carotid bruits  Cardiovascular: Regular rate and rhythm, no murmurs appreciated Pulmonary/Chest: Clear to auscultation bilaterally, no wheezes or rails Abdominal: Soft.  no distension.  no tenderness.  Musculoskeletal: Normal range of motion Neurological:  normal muscle tone. Coordination normal. No atrophy Skin: Skin warm and dry  Psychiatric: normal affect, pleasant  Recent Labs: 07/25/2022: ALT 10; BUN 18; Creatinine, Ser 1.03; Hemoglobin 10.2; Platelets 215; Potassium 4.7; Sodium 139; TSH 3.230    Lipid Panel Lab Results  Component Value Date   CHOL 141 07/25/2022   HDL 41 07/25/2022   LDLCALC 77 07/25/2022   TRIG 131 07/25/2022      Wt Readings from Last 3 Encounters:  10/05/22 156 lb (70.8 kg)  10/02/22 157 lb (71.2 kg)  07/24/22 157 lb (71.2 kg)      ASSESSMENT AND PLAN:  Chronic diastolic CHF (congestive heart failure) ([place]),  Stable, appears euvolemic Recommend she stay on Lasix every other day, extra Lasix for PND orthopnea ankle swelling abdominal swelling significant weight gain  Coronary artery disease with exertional angina ([place]) -  Plan: EKG 12-Lead Currently with no symptoms of angina. No further workup at this time. Continue current medication regimen.  Severe aortic stenosis - Plan: EKG 12-Lead TAVR, no significant murmur on exam, recent echo February 2023, stable  S/P TAVR (transcatheter aortic valve replacement) Stable clinical exam, no further work-up at this time  Chronic fatigue Busy at home, helps to clean the house  Essential hypertension Blood pressure is well controlled on today's visit. No changes made to the medications.   Total encounter time more than 30 minutes Greater than 50% was spent in counseling and coordination of care with the patient    No orders of the defined types were placed in this encounter.    Signed, Esmond Plants, M.D., Ph.D. 10/05/2022  [place], [place]

## 2022-10-05 ENCOUNTER — Encounter: Payer: Self-pay | Admitting: Cardiovascular Disease

## 2022-10-05 ENCOUNTER — Ambulatory Visit: Payer: Medicare Other | Attending: Cardiovascular Disease | Admitting: Cardiovascular Disease

## 2022-10-05 VITALS — BP 116/80 | HR 53 | Ht 66.0 in | Wt 156.0 lb

## 2022-10-05 DIAGNOSIS — Z952 Presence of prosthetic heart valve: Secondary | ICD-10-CM | POA: Diagnosis not present

## 2022-10-05 DIAGNOSIS — I493 Ventricular premature depolarization: Secondary | ICD-10-CM

## 2022-10-05 DIAGNOSIS — I350 Nonrheumatic aortic (valve) stenosis: Secondary | ICD-10-CM

## 2022-10-05 DIAGNOSIS — I10 Essential (primary) hypertension: Secondary | ICD-10-CM | POA: Diagnosis not present

## 2022-10-05 DIAGNOSIS — I340 Nonrheumatic mitral (valve) insufficiency: Secondary | ICD-10-CM

## 2022-10-05 DIAGNOSIS — I272 Pulmonary hypertension, unspecified: Secondary | ICD-10-CM

## 2022-10-05 DIAGNOSIS — I25118 Atherosclerotic heart disease of native coronary artery with other forms of angina pectoris: Secondary | ICD-10-CM

## 2022-10-05 DIAGNOSIS — I359 Nonrheumatic aortic valve disorder, unspecified: Secondary | ICD-10-CM

## 2022-10-05 DIAGNOSIS — I5032 Chronic diastolic (congestive) heart failure: Secondary | ICD-10-CM

## 2022-10-05 NOTE — Patient Instructions (Addendum)
Medication Instructions:  No changes  If you need a refill on your cardiac medications before your next appointment, please call your pharmacy.   Lab work: No new labs needed  Testing/Procedures: No new testing needed  Follow-Up: At [facility], you and your health needs are our priority.  As part of our continuing mission to provide you with exceptional heart care, we have created designated Provider Care Teams.  These Care Teams include your primary Cardiologist (physician) and Advanced Practice Providers (APPs -  Physician Assistants and Nurse Practitioners) who all work together to provide you with the care you need, when you need it.  You will need a follow up appointment in 12 months  Providers on your designated Care Team:   Christopher Berge, NP Ryan Dunn, PA-C Cadence Furth, PA-C  COVID-19 Vaccine Information can be found at: [email or web address] For questions related to vaccine distribution or appointments, please email [email or web address] or call [phone].   

## 2022-10-10 ENCOUNTER — Ambulatory Visit: Payer: Medicare Other | Admitting: Dermatology

## 2022-10-12 ENCOUNTER — Other Ambulatory Visit: Payer: Self-pay | Admitting: Cardiovascular Disease

## 2022-10-15 ENCOUNTER — Other Ambulatory Visit: Payer: Self-pay | Admitting: *Deleted

## 2022-10-15 ENCOUNTER — Encounter: Payer: Self-pay | Admitting: Cardiovascular Disease

## 2022-10-15 MED ORDER — METOPROLOL TARTRATE 25 MG PO TABS: ORAL_TABLET | ORAL | 3 refills | Status: DC

## 2022-10-15 MED ORDER — NITROGLYCERIN 0.4 MG SL SUBL: SUBLINGUAL_TABLET | SUBLINGUAL | 3 refills | Status: AC

## 2022-10-22 ENCOUNTER — Ambulatory Visit: Payer: Self-pay | Admitting: *Deleted

## 2022-10-23 NOTE — Patient Instructions (Signed)
Visit Information  Thank you for taking time to visit with me today. Please don't hesitate to contact me if I can be of assistance to you.   Following are the goals we discussed today:   Goals Addressed             This Visit's Progress    "Get some help with a bath and washing my hair"       Care Coordination Interventions:  CSW follow up call regarding resources sent for possible in-home assistance. Pt again reports receiving the resources mailed to her- CSW asked pt to get the packet and review it. CSW stressed to pt if she wants to get help with bathing, etc she and her family will have to follow through with the info and apply.  Reviewed Care Coordination Services:  Assessed Social Determinants of Health Previously discussed with pt potential programs (DSS Aide program, Veteran's program, etc) that may be able to help her with  "bathing and someone to wash my hair", life alert options. Pt shares she lives alone; has had some falls and has a granddaughter staying with her at night due to the falls. Pt reports her Left hand does not close well and thus she struggles to bathe independently Pt declines interest in moving to ALF, SNF, etc Will await their call back and/or follow up in 10-14 days if no outreach from them received.          Our next appointment is by telephone on 11/26/22  Please call the care guide team at [phone] if you need to cancel or reschedule your appointment.   If you are experiencing a Mental Health or [place] or need someone to talk to, please call the [place] National Suicide Prevention Lifeline: (226) 154-6901 or TTY: (423) 219-2806 TTY (339) 790-3392) to talk to a trained counselor call 911   Patient verbalizes understanding of instructions and care plan provided today and agrees to view in [place]. Active MyChart status and patient understanding of how to access instructions and care plan via MyChart confirmed with patient.      Telephone follow up appointment with care management team member scheduled for:11/26/22 Eduard Clos MSW, LCSW Licensed Clinical Social Worker    [phone]

## 2022-11-08 ENCOUNTER — Ambulatory Visit (INDEPENDENT_AMBULATORY_CARE_PROVIDER_SITE_OTHER): Payer: Medicare Other | Admitting: Nurse Practitioner

## 2022-11-08 ENCOUNTER — Encounter: Payer: Self-pay | Admitting: Nurse Practitioner

## 2022-11-08 VITALS — BP 134/76 | HR 67 | Resp 18 | Ht 67.0 in | Wt 154.0 lb

## 2022-11-08 DIAGNOSIS — J4530 Mild persistent asthma, uncomplicated: Secondary | ICD-10-CM

## 2022-11-08 DIAGNOSIS — M19041 Primary osteoarthritis, right hand: Secondary | ICD-10-CM

## 2022-11-08 DIAGNOSIS — I10 Essential (primary) hypertension: Secondary | ICD-10-CM | POA: Diagnosis not present

## 2022-11-08 DIAGNOSIS — M19042 Primary osteoarthritis, left hand: Secondary | ICD-10-CM | POA: Diagnosis not present

## 2022-11-08 NOTE — Progress Notes (Signed)
Established patient visit   Patient: Amy Kaiser   DOB: 1928/11/18   89 y.o. Female  MRN: [id number] Visit Date: 11/08/2022   Chief Complaint  Patient presents with   Follow-up   Hypertension   Subjective    HPI  Follow up -hypertension  --generally well controlled  --high this morning  Has had some increased family stress.  -states that she has been upset about a few things, even this morning.  -concerned that she continues to lose weight. States that she is trying to "get fat."  -Generalized osteoarthritis --increased dosing of celebrex to 100 mg as needed    Medications: Outpatient Medications Prior to Visit  Medication Sig   albuterol (VENTOLIN HFA) 108 (90 Base) MCG/ACT inhaler 2 puffs as needed   aspirin 81 MG EC tablet Take 162 mg by mouth daily. Swallow whole.   calcium-vitamin D (OSCAL WITH D) 500-200 MG-UNIT tablet Take 1 tablet by mouth 2 (two) times daily.   celecoxib (CELEBREX) 100 MG capsule Take 1 capsule (100 mg total) by mouth 2 (two) times daily between meals as needed for pain.   Cholecalciferol (VITAMIN D3) 1000 units CAPS Take 1,000 Units by mouth daily with lunch.    furosemide (LASIX) 20 MG tablet TAKE 1 TABLET BY MOUTH ON MONDAY, WEDNESDAY AND FRIDAY   metoprolol tartrate (LOPRESSOR) 25 MG tablet Take 1/2 tablet (12.5 mg) by mouth twice a day   Multiple Vitamins-Minerals (ICAPS AREDS 2 PO) Take 1 capsule by mouth 2 (two) times daily.   nitrofurantoin (MACRODANTIN) 100 MG capsule Take 1 capsule (100 mg total) by mouth daily.   nitroGLYCERIN (NITROSTAT) 0.4 MG SL tablet DISSOLVE ONE TABLET UNDER THE TONGUE EVERY 5 MINUTES AS NEEDED FOR CHEST PAIN.  DO NOT EXCEED A TOTAL OF 3 DOSES IN 15 MINUTES   rosuvastatin (CRESTOR) 20 MG tablet Take 20 mg by mouth at bedtime.   vitamin B-12 (CYANOCOBALAMIN) 500 MCG tablet Take 1 tablet (500 mcg total) by mouth daily.   [DISCONTINUED] ferrous sulfate 324 MG TBEC Take 324 mg by mouth 2 (two) times a week.    [DISCONTINUED] ondansetron (ZOFRAN) 4 MG tablet Take 1 tablet (4 mg total) by mouth every 8 (eight) hours as needed for nausea or vomiting.   No facility-administered medications prior to visit.    Review of Systems  Constitutional:  Positive for fatigue. Negative for activity change, appetite change, chills and fever.  HENT:  Negative for congestion, postnasal drip, rhinorrhea, sinus pressure, sinus pain, sneezing and sore throat.   Eyes: Negative.   Respiratory:  Negative for cough, chest tightness, shortness of breath and wheezing.   Cardiovascular:  Negative for chest pain and palpitations.  Gastrointestinal:  Negative for abdominal pain, constipation, diarrhea, nausea and vomiting.  Endocrine: Negative for cold intolerance, heat intolerance, polydipsia and polyuria.  Genitourinary:  Negative for dyspareunia, dysuria, flank pain, frequency and urgency.  Musculoskeletal:  Positive for arthralgias and myalgias. Negative for back pain.  Skin:  Negative for rash.  Allergic/Immunologic: Negative for environmental allergies.  Neurological:  Negative for dizziness, weakness and headaches.  Hematological:  Negative for adenopathy.  Psychiatric/Behavioral:  The patient is nervous/anxious.     Last CBC Lab Results  Component Value Date   WBC 8.5 07/25/2022   HGB 10.2 (L) 07/25/2022   HCT 31.4 (L) 07/25/2022   MCV 92 07/25/2022   MCH 30.0 07/25/2022   RDW 13.2 07/25/2022   PLT 215 46/80/3212   Last metabolic panel Lab Results  Component Value  Date   GLUCOSE 99 07/25/2022   NA 139 07/25/2022   K 4.7 07/25/2022   CL 101 07/25/2022   CO2 23 07/25/2022   BUN 18 07/25/2022   CREATININE 1.03 (H) 07/25/2022   EGFR 51 (L) 07/25/2022   CALCIUM 9.0 07/25/2022   PROT 7.5 07/25/2022   ALBUMIN 3.9 07/25/2022   LABGLOB 3.6 07/25/2022   AGRATIO 1.1 (L) 07/25/2022   BILITOT 0.6 07/25/2022   ALKPHOS 87 07/25/2022   AST 34 07/25/2022   ALT 10 07/25/2022   ANIONGAP 7 11/14/2021   Last  lipids Lab Results  Component Value Date   CHOL 141 07/25/2022   HDL 41 07/25/2022   LDLCALC 77 07/25/2022   TRIG 131 07/25/2022   CHOLHDL 3.4 07/25/2022   Last hemoglobin A1c Lab Results  Component Value Date   HGBA1C 6.1 (H) 07/25/2022   Last thyroid functions Lab Results  Component Value Date   TSH 3.230 07/25/2022   Last vitamin D Lab Results  Component Value Date   VD25OH 40.9 02/01/2019       Objective     Today's Vitals   11/08/22 1014 11/08/22 1015 11/08/22 1104  BP: (Abnormal) 164/76 (Abnormal) 171/74 134/76  Pulse: 64  67  Resp: 18    SpO2: 98%    Weight: 154 lb (69.9 kg)    Height: 5' 7"  (1.702 m)     Body mass index is 24.12 kg/m.  BP Readings from Last 3 Encounters:  11/22/22 137/76  11/08/22 134/76  10/05/22 116/80    Wt Readings from Last 3 Encounters:  11/22/22 151 lb 6.4 oz (68.7 kg)  11/08/22 154 lb (69.9 kg)  10/05/22 156 lb (70.8 kg)    Physical Exam Vitals and nursing note reviewed.  Constitutional:      Appearance: Normal appearance. She is well-developed.  HENT:     Head: Normocephalic and atraumatic.     Nose: Nose normal.     Mouth/Throat:     Mouth: Mucous membranes are moist.     Pharynx: Oropharynx is clear.  Eyes:     Extraocular Movements: Extraocular movements intact.     Conjunctiva/sclera: Conjunctivae normal.     Pupils: Pupils are equal, round, and reactive to light.  Neck:     Vascular: No carotid bruit.  Cardiovascular:     Rate and Rhythm: Normal rate and regular rhythm.     Pulses: Normal pulses.     Heart sounds: Normal heart sounds.  Pulmonary:     Effort: Pulmonary effort is normal.     Breath sounds: Normal breath sounds.  Abdominal:     Palpations: Abdomen is soft.  Musculoskeletal:        General: Normal range of motion.     Cervical back: Normal range of motion and neck supple.     Comments: Generalized joint  pain, most severe in the hands. There is considerable joint swelling of the  metacarpal joints of both hands. ROM and grip strength  are mildly diminished, bilaterally    Lymphadenopathy:     Cervical: No cervical adenopathy.  Skin:    General: Skin is warm and dry.     Capillary Refill: Capillary refill takes less than 2 seconds.  Neurological:     General: No focal deficit present.     Mental Status: She is alert and oriented to person, place, and time.  Psychiatric:        Mood and Affect: Mood normal.  Behavior: Behavior normal.        Thought Content: Thought content normal.        Judgment: Judgment normal.      Assessment & Plan    1. Primary osteoarthritis of both hands  May use celebrex 100 mg  up to twice daily as needed for pain and inflammation.   2. Essential hypertension Generally stable. Continue medication as prescribed    3. Mild persistent asthma without complication Use rescue inhaler as needed and as prescribed    Problem List Items Addressed This Visit       Cardiovascular and Mediastinum   Essential hypertension     Respiratory   Mild persistent asthma     Musculoskeletal and Integument   Primary osteoarthritis of both hands - Primary     Return in about 3 months (around 02/07/2023) for blood pressure.         Ronnell Freshwater, NP  [facility] [phone] (phone) [phone] (fax)  [place]

## 2022-11-13 ENCOUNTER — Ambulatory Visit: Payer: Self-pay | Admitting: *Deleted

## 2022-11-13 NOTE — Patient Instructions (Signed)
Visit Information  Thank you for taking time to visit with me today. Please don't hesitate to contact me if I can be of assistance to you.   Following are the goals we discussed today:   Goals Addressed             This Visit's Progress    "Get some help with a bath and washing my hair"       Care Coordination Interventions:  CSW follow up call regarding resources sent for possible in-home assistance. Pt again reports receiving the resources mailed to her- Pt admits to struggling with remembering, etc. CSW attempted to reach daughter by phone and left message- hope to get family  involved to help pt with reviewing and pursuing the resource support info mailed.  Reviewed Care Coordination Services:  Assessed Social Determinants of Health Previously discussed with pt potential programs (DSS Aide program, Veteran's program, etc) that may be able to help her with  "bathing and someone to wash my hair", life alert options. Pt shares she lives alone; has had some falls and has a granddaughter staying with her at night due to the falls. Pt reports her Left hand does not close well and thus she struggles to bathe independently Pt declines interest in moving to ALF, SNF, etc Will await their call back and/or follow up in 10-14 days if no outreach from them received.          Our next appointment is by telephone on 12/04/22  Please call the care guide team at [phone] if you need to cancel or reschedule your appointment.   If you are experiencing a Mental Health or [place] or need someone to talk to, please call the [place] National Suicide Prevention Lifeline: 207-691-9752 or TTY: 336-582-0926 TTY [phone]) to talk to a trained counselor call 911   Patient verbalizes understanding of instructions and care plan provided today and agrees to view in [place]. Active MyChart status and patient understanding of how to access instructions and care plan via MyChart confirmed  with patient.     Telephone follow up appointment with care management team member scheduled for: 12/04/22  Eduard Clos MSW, LCSW Licensed Clinical Social Worker   [phone]

## 2022-11-13 NOTE — Patient Outreach (Signed)
  Care Coordination   Follow Up Visit Note   11/13/2022 Name: Amy Kaiser MRN: [id number] DOB: 14-Mar-1929  Amy Kaiser is a 89 y.o. year old female who sees B[place], Greer Ee, NP for primary care. I spoke with  Amy Kaiser by phone today.  What matters to the patients health and wellness today?  Needs help at home.    Goals Addressed             This Visit's Progress    "Get some help with a bath and washing my hair"       Care Coordination Interventions:  CSW follow up call regarding resources sent for possible in-home assistance. Pt again reports receiving the resources mailed to her- Pt admits to struggling with remembering, etc. CSW attempted to reach daughter by phone and left message- hope to get family  involved to help pt with reviewing and pursuing the resource support info mailed.  Reviewed Care Coordination Services:  Assessed Social Determinants of Health Previously discussed with pt potential programs (DSS Aide program, Veteran's program, etc) that may be able to help her with  "bathing and someone to wash my hair", life alert options. Pt shares she lives alone; has had some falls and has a granddaughter staying with her at night due to the falls. Pt reports her Left hand does not close well and thus she struggles to bathe independently Pt declines interest in moving to ALF, SNF, etc Will await their call back and/or follow up in 10-14 days if no outreach from them received.          SDOH assessments and interventions completed:  Yes     Care Coordination Interventions:  Yes, provided   Follow up plan: Follow up call scheduled for 12/04/22    Encounter Outcome:  Pt. Visit Completed

## 2022-11-18 ENCOUNTER — Other Ambulatory Visit: Payer: Self-pay | Admitting: Nurse Practitioner

## 2022-11-18 DIAGNOSIS — N390 Urinary tract infection, site not specified: Secondary | ICD-10-CM

## 2022-11-22 ENCOUNTER — Encounter: Payer: Self-pay | Admitting: Neurology

## 2022-11-22 ENCOUNTER — Ambulatory Visit: Payer: Medicare Other | Admitting: Neurology

## 2022-11-22 VITALS — BP 137/76 | HR 83 | Wt 151.4 lb

## 2022-11-22 DIAGNOSIS — F02818 Dementia in other diseases classified elsewhere, unspecified severity, with other behavioral disturbance: Secondary | ICD-10-CM | POA: Diagnosis not present

## 2022-11-22 DIAGNOSIS — G301 Alzheimer's disease with late onset: Secondary | ICD-10-CM

## 2022-11-22 NOTE — Progress Notes (Signed)
NEUROLOGY FOLLOW UP OFFICE NOTE  Amy Kaiser [id number] [birth year]  HISTORY OF PRESENT ILLNESS: I had the pleasure of seeing Amy Kaiser in follow-up in the neurology clinic on 11/22/2022.  The patient was last seen a year ago by Memory Disorders PA Amy Kaiser. She is accompanied by her granddaughter Amy Kaiser who helps supplement the history today.  Records and images were personally reviewed where available. Amy Kaiser moved in with her 1.5 years ago. Amy Kaiser reports that the patient sometimes misses her doses of medications but is doing a bit better now. Her daughter manages finances. She does not drive. Amy Kaiser cooks her meals, she tries to [employer] but has burned pots a few times and left the broiler on. She mostly stays home, doing chores, watching TV, reading, or talking on the phone. She is independent with dressing and bathing. Amy Kaiser has noticed more short-term memory changes that can be frustrating for both of them. No behavioral changes/agitation. She was previously having visual hallucinations at night, but she reports these have stopped. Sometimes when she wakes up at night and feels a little lost, she looks for familiar objects and is able to reorient herself. Mood was rocky last Christmas with 2 of her siblings passing away, but she was happy to see her grandchildren/great grandchildren. She enjoys working on her 30 pots of plants and looks forward to Spring. No headaches, dizziness, focal numbness/tingling/weakness, no falls. She does not sleep well, she seems to sleep better if she eats a good meal but she does not eat a lot and does not have an appetite much anymore.     History on Initial Assessment 09/09/2020: This is an 89 year old right-handed woman with a history of hyperlipidemia, migraines, COPD, cervical cancer, depression, anxiety, presenting for evaluation of visual hallucinations. Amy Kaiser, her friend of 74 years, is present to provide additional information. After the visit, I  also spoke to her daughter Amy Kaiser on the phone. She lives alone. She does not think her memory is good at all. Her daughter started noticing changes for quite a while, she lost her husband, then her 2 sons, worse in the past 2-3 years when her second son passed away. She repeats herself. She reports she has a terrible time remembering her medications, worse in the past year. She would not recall if she took it already. They tried different ways, she had difficulty with a pillbox. Her daughter and granddaughter live across the street, they come and put medications in her pillbox, calling her daily to remind to take them. Her daughter took over finances last year. She does not drive. She stopped driving after heart surgery years ago when she was "losing control" of her body, no loss of consciousness. This is not occurring any longer. She has not been bathing as much as she wants to due to her right knee or dizziness, she does not feel safe. She does regular "bird baths." She has lost her appetite, her daughter brings food. She had burned a couple of pots. She gets lost in her own house, thinking she is in her bedroom then waking up in the dining room not knowing where she was.  Amy Kaiser denies any personality changes.    She started having hallucinations within the past year. Her daughter reports hallucinations worsened when a friend of hers was diagnosed with cancer and passed away around 6 months ago. They are sporadic, she called her daughter at 2AM one time asking where her baby was, another time she  saw a snake at 2AM. She thought her sons (who have passed away) visited her, she saw them at the door and heard them, but did not find them in the living room after. The last time she had a hallucination was 3 months ago, she saw her daughter-in-law in the yard. Amy Kaiser adds that one time she saw a snake in her covers, she was confused and found to have a UTI. She has a history of frequent UTIs. She reports that her  mother had visual hallucinations in her late 87s and saw bugs on her.   She reports terrible headaches which are new, at one point she had 3 days where she took Tylenol twice a day. Headaches have quieted down, she has had 1 or 2 since then. She denies any dizziness, diplopia, dysarthria/dysphagia, neck/back pain, focal numbness/tingling/weakness, bowel/bladder dysfunction. She has tremors/"shakes" when her glucose levels are low, none if eating fine. No anosmia. Over the past 2 years, she stumbles around a lot and uses a cane outside. Sleep is good, no clear REM behavior disorder. No history of significant head injuries or alcohol use. .   I personally reviewed MRI brain without contrast done 06/2020 which did not show any acute changes. There was mild diffuse atrophy, moderate chronic microvascular disease.   PAST MEDICAL HISTORY: Past Medical History:  Diagnosis Date   Anemia    Anxiety    Arthritis    "just about all over" (03/11/2017)   Asthma    Cervical cancer (Amy Kaiser)    Chronic UTI    COPD (chronic obstructive pulmonary disease) ([place])    Coronary artery disease    Depression    Heart murmur    Hip fracture ([facility])    left hip   Hyperlipidemia    Low blood sugar    Migraine    "work related; quit when I quit work in ~ 1997" (03/11/2017)   PONV (postoperative nausea and vomiting)    Skin cancer    back    MEDICATIONS: Current Outpatient Medications on File Prior to Visit  Medication Sig Dispense Refill   albuterol (VENTOLIN HFA) 108 (90 Base) MCG/ACT inhaler 2 puffs as needed     aspirin 81 MG EC tablet Take 162 mg by mouth daily. Swallow whole.     calcium-vitamin D (OSCAL WITH D) 500-200 MG-UNIT tablet Take 1 tablet by mouth 2 (two) times daily.     celecoxib (CELEBREX) 100 MG capsule Take 1 capsule (100 mg total) by mouth 2 (two) times daily between meals as needed for pain. 60 capsule 1   Cholecalciferol (VITAMIN D3) 1000 units CAPS Take 1,000 Units by mouth daily with lunch.       furosemide (LASIX) 20 MG tablet TAKE 1 TABLET BY MOUTH ON MONDAY, WEDNESDAY AND FRIDAY 38 tablet 2   metoprolol tartrate (LOPRESSOR) 25 MG tablet Take 1/2 tablet (12.5 mg) by mouth twice a day 90 tablet 3   Multiple Vitamins-Minerals (ICAPS AREDS 2 PO) Take 1 capsule by mouth 2 (two) times daily.     nitrofurantoin (MACRODANTIN) 100 MG capsule Take 1 capsule (100 mg total) by mouth daily. 90 capsule 1   nitroGLYCERIN (NITROSTAT) 0.4 MG SL tablet DISSOLVE ONE TABLET UNDER THE TONGUE EVERY 5 MINUTES AS NEEDED FOR CHEST PAIN.  DO NOT EXCEED A TOTAL OF 3 DOSES IN 15 MINUTES 25 tablet 3   rosuvastatin (CRESTOR) 20 MG tablet Take 20 mg by mouth at bedtime.     vitamin B-12 (CYANOCOBALAMIN) 500 MCG  tablet Take 1 tablet (500 mcg total) by mouth daily. 90 tablet 0   No current facility-administered medications on file prior to visit.    ALLERGIES: Allergies  Allergen Reactions   Latex Hives, Itching and Other (See Comments)    Caused blisters in her mouth   Lisinopril Other (See Comments)    Weakness    Penicillins Other (See Comments)    Intolerance to ALL "CILLINS" > UNSPECIFIED REACTIONS  Has patient had a PCN reaction causing immediate rash, facial/tongue/throat swelling, SOB or lightheadedness with hypotension: UNSPECIFIED REACTION   Has patient had a PCN reaction causing severe rash involving mucus membranes or skin necrosis: UNSPECIFIED REACTION   Has patient had a PCN reaction that required hospitalization UNSPECIFIED REACTION   Has patient had a PCN reaction occurring within the last 10 years: UNSPECIFIED REACTION  Doesn't recall  Intolerance to ALL "CILLINS" > UNSPECIFIED REACTIONS    Has patient had a PCN reaction causing immediate rash, facial/tongue/throat swelling, SOB or lightheadedness with hypotension: UNSPECIFIED REACTION   Has patient had a PCN reaction causing severe rash involving mucus membranes or skin necrosis: UNSPECIFIED REACTION   Has patient had a PCN  reaction that required hospitalization UNSPECIFIED REACTION   Has patient had a PCN reaction occurring within the last 10 years: UNSPECIFIED REACTION   Pravachol [Pravastatin Sodium] Other (See Comments)    MYALGIAs LEG PAIN    Pravastatin Other (See Comments)    Other reaction(s): leg pain  MYALGIAs  LEG PAIN   Alendronate Sodium Other (See Comments)    UNSPECIFIED REACTION  [Patient denies this allergy]    Amoxicillin Other (See Comments)    Doesn't recall   Other Other (See Comments)    Darvocet - unknown   Propoxyphene Other (See Comments)   Rosuvastatin Other (See Comments)    High doses - Myalgia   Sulfamethoxazole-Trimethoprim Other (See Comments)    Pt denies    Budesonide-Formoterol Fumarate Anxiety and Other (See Comments)    "SHAKY"   Bupropion Anxiety and Other (See Comments)    "JITTERY"   Codeine Nausea And Vomiting and Other (See Comments)   Fluoxetine Anxiety and Other (See Comments)    Other reaction(s): shaky  "FEELS SHAKY"   Morphine Nausea And Vomiting   Morphine And Related Nausea And Vomiting   Prozac [Fluoxetine Hcl] Anxiety    "FEELS SHAKY"     FAMILY HISTORY: History reviewed. No pertinent family history.  SOCIAL HISTORY: Social History   Socioeconomic History   Marital status: Widowed    Spouse name: Not on file   Number of children: 3   Years of education: Not on file   Highest education level: Not on file  Occupational History   Not on file  Tobacco Use   Smoking status: Never   Smokeless tobacco: Never  Vaping Use   Vaping Use: Never used  Substance and Sexual Activity   Alcohol use: No   Drug use: No   Sexual activity: Not Currently  Other Topics Concern   Not on file  Social History Narrative   Right handed    Lives with grand daughter       Social Determinants of Health   Financial Resource Strain: Medium Risk (08/30/2022)   Overall Financial Resource Strain (CARDIA)    Difficulty of Paying Living Expenses:  Somewhat hard  Food Insecurity: No Food Insecurity (08/30/2022)   Hunger Vital Sign    Worried About Running Out of Food in the Last Year: Never true  Ran Out of Food in the Last Year: Never true  Transportation Needs: No Transportation Needs (08/30/2022)   PRAPARE - Hydrologist (Medical): No    Lack of Transportation (Non-Medical): No  Physical Activity: Not on file  Stress: Not on file  Social Connections: Unknown (07/02/2022)   Social Connection and Isolation Panel [NHANES]    Frequency of Communication with Friends and Family: More than three times a week    Frequency of Social Gatherings with Friends and Family: More than three times a week    Attends Religious Services: Not on file    Active Member of Clubs or Organizations: Not on file    Attends Archivist Meetings: Not on file    Marital Status: Widowed  Intimate Partner Violence: Not At Risk (08/30/2022)   Humiliation, Afraid, Rape, and Kick questionnaire    Fear of Current or Ex-Partner: No    Emotionally Abused: No    Physically Abused: No    Sexually Abused: No     PHYSICAL EXAM: Vitals:   11/22/22 0954  BP: 137/76  Pulse: 83  SpO2: 96%   General: No acute distress Head:  Normocephalic/atraumatic Skin/Extremities: No rash, no edema Neurological Exam: alert and oriented to person, place, and time. No aphasia or dysarthria. Fund of knowledge is appropriate.  Recent and remote memory are impaired.  Attention and concentration are normal. MMSE 26/30    11/22/2022   10:00 AM 11/22/2021   12:00 PM  MMSE - Mini Mental State Exam  Orientation to time 4 5  Orientation to Place 4 5  Registration 3 3  Attention/ Calculation 5 2  Recall 1 2  Language- name 2 objects 2 2  Language- repeat 1 1  Language- follow 3 step command 3 3  Language- read & follow direction 1 1  Write a sentence 1 1  Copy design 1 1  Total score 26 26   Cranial nerves: Pupils equal, round.  Extraocular movements intact with no nystagmus. Visual fields full.  No facial asymmetry.  Motor: Bulk and tone normal, muscle strength 5/5 throughout with no pronator drift.   Finger to nose testing intact.  Gait slow and cautious with cane, favoring right leg. No ataxia   IMPRESSION: This is a 89 yo RH woman with a history of hyperlipidemia, migraines, COPD, cervical cancer, depression, anxiety, with Alzheimer's disease with slow progression. MMSE today 26/30 (26/30 in 11/2021). She reports visual hallucinations have stopped, granddaughter denies any personality changes. She is not on dementia medications, we again discussed and agree that at this point, side effects outweigh potential benefits. They will discuss appetite and sleep with PCP. Continue close supervision, she does not drive. We discussed the importance of control of vascular risk factors, physical exercise, brain stimulation exercises for overall brain health. Follow-up as needed, call for any changes.   Thank you for allowing me to participate in her care.  Please do not hesitate to call for any questions or concerns.    Ellouise Newer, M.D.   CC: Leretha Pol, NP

## 2022-11-22 NOTE — Patient Instructions (Signed)
Always good to see you. Discuss appetite and sleep with your PCP. Follow-up as needed, call for any changes.

## 2022-11-26 ENCOUNTER — Ambulatory Visit: Payer: Self-pay | Admitting: *Deleted

## 2022-11-26 NOTE — Patient Outreach (Signed)
  Care Coordination   Follow Up Visit Note   11/26/2022 Name: Amy Kaiser MRN: [id number] DOB: 14-Dec-1928  Amy Kaiser is a 89 y.o. year old female who sees Boscia, Greer Ee, NP for primary care. I spoke with  Amy Kaiser by phone today.  What matters to the patients health and wellness today?  Family is helping more- received resources and will consider     Goals Addressed             This Visit's Progress    COMPLETED: "Get some help with a bath and washing my hair"       Care Coordination Interventions:  CSW follow up call regarding resources sent for possible in-home assistance. Pt again reports receiving the resources mailed to her- Pt admits to struggling with remembering, etc. CSW attempted to reach daughter by phone and left message- hope to get family  involved to help pt with reviewing and pursuing the resource support info mailed.  Reviewed Care Coordination Services:  Assessed Social Determinants of Health Previously discussed with pt potential programs (DSS Aide program, Veteran's program, etc) that may be able to help her with  "bathing and someone to wash my hair", life alert options. Pt shares she lives alone; has had some falls and has a granddaughter staying with her at night due to the falls. Pt reports her Left hand does not close well and thus she struggles to bathe independently Pt declines interest in moving to ALF, SNF, etc Will await their call back and/or follow up in 10-14 days if no outreach from them received.          SDOH assessments and interventions completed:  Yes     Care Coordination Interventions:  Yes, provided   Follow up plan: No further intervention required.   Encounter Outcome:  Pt. Visit Completed

## 2022-11-26 NOTE — Patient Instructions (Signed)
Visit Information  Thank you for taking time to visit with me today. Please don't hesitate to contact me if I can be of assistance to you.   Following are the goals we discussed today:   Goals Addressed             This Visit's Progress    COMPLETED: "Get some help with a bath and washing my hair"       Care Coordination Interventions:  CSW follow up call regarding resources sent for possible in-home assistance. Pt again reports receiving the resources mailed to her- Pt admits to struggling with remembering, etc. CSW attempted to reach daughter by phone and left message- hope to get family  involved to help pt with reviewing and pursuing the resource support info mailed.  Reviewed Care Coordination Services:  Assessed Social Determinants of Health Previously discussed with pt potential programs (DSS Aide program, Veteran's program, etc) that may be able to help her with  "bathing and someone to wash my hair", life alert options. Pt shares she lives alone; has had some falls and has a granddaughter staying with her at night due to the falls. Pt reports her Left hand does not close well and thus she struggles to bathe independently Pt declines interest in moving to ALF, SNF, etc Will await their call back and/or follow up in 10-14 days if no outreach from them received.          Please call me or your PCP if further needs arise.  If you are experiencing a Mental Health or [place] or need someone to talk to, please call the Suicide and Crisis Lifeline: 988 call 911   The patient verbalized understanding of instructions, educational materials, and care plan provided today and DECLINED offer to receive copy of patient instructions, educational materials, and care plan.   No further follow up required:    Eduard Clos, MSW, [place] Worker [facility] [phone]

## 2022-12-13 ENCOUNTER — Other Ambulatory Visit: Payer: Self-pay | Admitting: Nurse Practitioner

## 2022-12-13 DIAGNOSIS — N390 Urinary tract infection, site not specified: Secondary | ICD-10-CM

## 2022-12-13 NOTE — Telephone Encounter (Signed)
L.O.V: 11/08/22  N.O.V: 02/07/23  L.R.F: 06/05/22 90 cap 1 refill

## 2023-01-03 NOTE — Telephone Encounter (Signed)
Error

## 2023-02-07 ENCOUNTER — Ambulatory Visit (INDEPENDENT_AMBULATORY_CARE_PROVIDER_SITE_OTHER): Payer: Medicare Other | Admitting: Nurse Practitioner

## 2023-02-07 ENCOUNTER — Encounter: Payer: Self-pay | Admitting: Nurse Practitioner

## 2023-02-07 VITALS — BP 147/73 | HR 72 | Ht 67.0 in | Wt 155.1 lb

## 2023-02-07 DIAGNOSIS — J4530 Mild persistent asthma, uncomplicated: Secondary | ICD-10-CM

## 2023-02-07 DIAGNOSIS — I10 Essential (primary) hypertension: Secondary | ICD-10-CM | POA: Diagnosis not present

## 2023-02-07 DIAGNOSIS — M19049 Primary osteoarthritis, unspecified hand: Secondary | ICD-10-CM

## 2023-02-07 DIAGNOSIS — I350 Nonrheumatic aortic (valve) stenosis: Secondary | ICD-10-CM | POA: Diagnosis not present

## 2023-02-07 MED ORDER — ALBUTEROL SULFATE HFA 108 (90 BASE) MCG/ACT IN AERS: 2.0000 | INHALATION_SPRAY | Freq: Four times a day (QID) | RESPIRATORY_TRACT | 3 refills | Status: AC | PRN

## 2023-02-07 NOTE — Progress Notes (Signed)
Established patient visit   Patient: Amy Kaiser   DOB: 07/08/1929   89 y.o. Female  MRN: [id number] Visit Date: 02/07/2023   Chief Complaint  Patient presents with   Medical Management of Chronic Issues   Subjective    HPI  Follow up  -Blood pressure  -aortic stenosis.  --sees cardiology routinely  -severe arthritis with bony abnormalities of both hands.  -taking celebrex to help.   She denies chest pain, chest pressure, or shortness of breath. She denies headaches or visual disturbances. She denies abdominal pain, nausea, vomiting, or changes in bowel or bladder habits.     Medications: Outpatient Medications Prior to Visit  Medication Sig   aspirin 81 MG EC tablet Take 162 mg by mouth daily. Swallow whole.   calcium-vitamin D (OSCAL WITH D) 500-200 MG-UNIT tablet Take 1 tablet by mouth 2 (two) times daily.   celecoxib (CELEBREX) 100 MG capsule Take 1 capsule (100 mg total) by mouth 2 (two) times daily between meals as needed for pain.   Cholecalciferol (VITAMIN D3) 1000 units CAPS Take 1,000 Units by mouth daily with lunch.    metoprolol tartrate (LOPRESSOR) 25 MG tablet Take 1/2 tablet (12.5 mg) by mouth twice a day   Multiple Vitamins-Minerals (ICAPS AREDS 2 PO) Take 1 capsule by mouth 2 (two) times daily.   nitrofurantoin (MACRODANTIN) 100 MG capsule Take 1 capsule by mouth once daily   nitroGLYCERIN (NITROSTAT) 0.4 MG SL tablet DISSOLVE ONE TABLET UNDER THE TONGUE EVERY 5 MINUTES AS NEEDED FOR CHEST PAIN.  DO NOT EXCEED A TOTAL OF 3 DOSES IN 15 MINUTES   rosuvastatin (CRESTOR) 20 MG tablet Take 20 mg by mouth at bedtime.   vitamin B-12 (CYANOCOBALAMIN) 500 MCG tablet Take 1 tablet (500 mcg total) by mouth daily.   [DISCONTINUED] albuterol (VENTOLIN HFA) 108 (90 Base) MCG/ACT inhaler 2 puffs as needed   [DISCONTINUED] furosemide (LASIX) 20 MG tablet TAKE 1 TABLET BY MOUTH ON MONDAY, WEDNESDAY AND FRIDAY   No facility-administered medications prior to visit.     Review of Systems See HPI     Last CBC Lab Results  Component Value Date   WBC 8.5 07/25/2022   HGB 10.2 (L) 07/25/2022   HCT 31.4 (L) 07/25/2022   MCV 92 07/25/2022   MCH 30.0 07/25/2022   RDW 13.2 07/25/2022   PLT 215 07/25/2022   Last metabolic panel Lab Results  Component Value Date   GLUCOSE 99 07/25/2022   NA 139 07/25/2022   K 4.7 07/25/2022   CL 101 07/25/2022   CO2 23 07/25/2022   BUN 18 07/25/2022   CREATININE 1.03 (H) 07/25/2022   EGFR 51 (L) 07/25/2022   CALCIUM 9.0 07/25/2022   PROT 7.5 07/25/2022   ALBUMIN 3.9 07/25/2022   LABGLOB 3.6 07/25/2022   AGRATIO 1.1 (L) 07/25/2022   BILITOT 0.6 07/25/2022   ALKPHOS 87 07/25/2022   AST 34 07/25/2022   ALT 10 07/25/2022   ANIONGAP 7 11/14/2021   Last lipids Lab Results  Component Value Date   CHOL 141 07/25/2022   HDL 41 07/25/2022   LDLCALC 77 07/25/2022   TRIG 131 07/25/2022   CHOLHDL 3.4 07/25/2022   Last hemoglobin A1c Lab Results  Component Value Date   HGBA1C 6.1 (H) 07/25/2022   Last thyroid functions Lab Results  Component Value Date   TSH 3.230 07/25/2022   Last vitamin D Lab Results  Component Value Date   VD25OH 40.9 02/01/2019   Last vitamin B12 and  Folate Lab Results  Component Value Date   VITAMINB12 723 11/13/2021   FOLATE 16.0 02/02/2019       Objective     Today's Vitals   02/07/23 1106 02/07/23 1119  BP: (Abnormal) 164/73 (Abnormal) 147/73  Pulse: 72   SpO2: 98%   Weight: 155 lb 1.9 oz (70.4 kg)   Height: 5\' 7"  (1.702 m)    Body mass index is 24.3 kg/m.  BP Readings from Last 3 Encounters:  02/07/23 (Abnormal) 147/73  11/22/22 137/76  11/08/22 134/76    Wt Readings from Last 3 Encounters:  02/07/23 155 lb 1.9 oz (70.4 kg)  11/22/22 151 lb 6.4 oz (68.7 kg)  11/08/22 154 lb (69.9 kg)    Physical Exam Vitals and nursing note reviewed.  Constitutional:      Appearance: Normal appearance. She is well-developed.  HENT:     Head: Normocephalic and  atraumatic.     Nose: Nose normal.     Mouth/Throat:     Mouth: Mucous membranes are moist.     Pharynx: Oropharynx is clear.  Eyes:     Extraocular Movements: Extraocular movements intact.     Conjunctiva/sclera: Conjunctivae normal.     Pupils: Pupils are equal, round, and reactive to light.  Neck:     Vascular: No carotid bruit.  Cardiovascular:     Rate and Rhythm: Normal rate and regular rhythm.     Pulses: Normal pulses.     Heart sounds: Normal heart sounds.  Pulmonary:     Effort: Pulmonary effort is normal.     Breath sounds: Normal breath sounds.  Abdominal:     Palpations: Abdomen is soft.  Musculoskeletal:        General: Normal range of motion.     Cervical back: Normal range of motion and neck supple.     Comments: Generalized joint  pain, most severe in the hands. There is considerable joint swelling of the metacarpal joints of both hands. ROM and grip strength  are mildly diminished, bilaterally    Lymphadenopathy:     Cervical: No cervical adenopathy.  Skin:    General: Skin is warm and dry.     Capillary Refill: Capillary refill takes less than 2 seconds.  Neurological:     General: No focal deficit present.     Mental Status: She is alert and oriented to person, place, and time.  Psychiatric:        Mood and Affect: Mood normal.        Behavior: Behavior normal.        Thought Content: Thought content normal.        Judgment: Judgment normal.       Assessment & Plan    Essential hypertension Assessment & Plan: Blood pressure slightly elevated today, generally well controlled.  Dash diet recommended.  Will continue to monitor.   Mild persistent asthma without complication Assessment & Plan: Continue to use rescue inhaler as needed for wheezing/shortness of breath.   Orders: -     Albuterol Sulfate HFA; Inhale 2 puffs into the lungs every 6 (six) hours as needed for wheezing or shortness of breath.  Dispense: 1 each; Refill: 3  Hand  arthritis Assessment & Plan: Pain improved with celebrex. May continue to take twice daily as needed for pain/inflammation.    Severe aortic stenosis Assessment & Plan: Stable. Patient sees cardiology on routine basis.       Return in about 4 months (around 06/09/2023) for medicare wellness.  Ronnell Freshwater, NP  [facility] at [facility] [phone] (phone) [phone] (fax)  [place]

## 2023-02-22 ENCOUNTER — Other Ambulatory Visit: Payer: Self-pay | Admitting: Cardiovascular Disease

## 2023-03-10 NOTE — Assessment & Plan Note (Signed)
Stable. Patient sees cardiology on routine basis.

## 2023-03-10 NOTE — Assessment & Plan Note (Signed)
Pain improved with celebrex. May continue to take twice daily as needed for pain/inflammation.

## 2023-03-10 NOTE — Assessment & Plan Note (Signed)
Blood pressure slightly elevated today, generally well controlled.  Dash diet recommended.  Will continue to monitor.

## 2023-03-10 NOTE — Assessment & Plan Note (Signed)
Continue to use rescue inhaler as needed for wheezing/shortness of breath.

## 2023-03-21 ENCOUNTER — Other Ambulatory Visit: Payer: Self-pay | Admitting: Cardiovascular Disease

## 2023-03-21 MED ORDER — FUROSEMIDE 20 MG PO TABS: 20.0000 mg | ORAL_TABLET | ORAL | 3 refills | Status: DC

## 2023-04-16 ENCOUNTER — Other Ambulatory Visit: Payer: Self-pay | Admitting: Nurse Practitioner

## 2023-04-16 DIAGNOSIS — N390 Urinary tract infection, site not specified: Secondary | ICD-10-CM

## 2023-04-18 ENCOUNTER — Other Ambulatory Visit: Payer: Self-pay | Admitting: Nurse Practitioner

## 2023-04-18 DIAGNOSIS — N390 Urinary tract infection, site not specified: Secondary | ICD-10-CM

## 2023-04-18 MED ORDER — NITROFURANTOIN MACROCRYSTAL 100 MG PO CAPS: 100.0000 mg | ORAL_CAPSULE | Freq: Every day | ORAL | 0 refills | Status: DC

## 2023-05-03 ENCOUNTER — Telehealth: Payer: Self-pay | Admitting: Cardiovascular Disease

## 2023-05-03 NOTE — Telephone Encounter (Signed)
Pt c/o BP issue: STAT if pt c/o blurred vision, one-sided weakness or slurred speech  1. What are your last 5 BP readings?  Today 196/96 (this is when EMS was called) patient had taken medicine before this. No other blood pressure readings.  2. Are you having any other symptoms (ex. Dizziness, headache, blurred vision, passed out)? Weakness and felt like she was going to fall off the porch when standing up, also states she can feel her heartbeat in her head, states she does not feel this right now, but feels very tires.   3. What is your BP issue? elevated  Patient grandaughter states they called EMT, states they did an EKG and it came back "normal"  Patient would like to add Aundra Millet, her grandaughter for [place] to speak with when this callback is made, patient states she will be present.

## 2023-05-03 NOTE — Telephone Encounter (Signed)
Per [employer], patient states she is having some blood pressure issues. Attempted to call patient to get more information, no answer on either contact number and no voicemail available. Responded to patient via mychart message to call our office to collect more information.

## 2023-05-06 NOTE — Progress Notes (Unsigned)
Cardiology Office Note  Date:  05/07/2023   ID:  Amy Kaiser, DOB 1929-01-15, MRN [id number]  PCP:  Carlean Jews, NP   Chief Complaint  Patient presents with   Elevated BP & weakness     HPI:  Amy Kaiser is a 89 y.o. female with PMH of  severe aortic stenosis with progressive symptoms of diastolic heart failure  TAVR with a 20 mm Sapien 3 valve via a percutaneous transfemoral approach 04/09/2017.  severe stenosis of the proximal RCA  stenting with a 4 mm drug-eluting stent 03/11/2017.  Who presents for f/u of her TAVR  LOV December 2023 Stress at home,  grandson living with her after he has separated from his wife, [name] very anxious as her grandson has guns and she told him not to bring them in the house She is concerned as her son committed suicide in the past Granddaughter also living with her, has cancer  Last Friday very anxious, concerned about guns, safety of grandson, in that setting had markedly high blood pressure 190 systolic Saturday: pressure 140 systolic Today systolic pressure 120 Realizes she was very anxious about family living situation in particular worried about grandson and his safety, concerned he may hurt himself Other family members across the street getting angry at her over going to funeral home to clarify her cemetery plot, " doing this to help them" but daughter was upset  In the setting of high blood pressure, had pulsation up in her ears This has since resolved  Takes care of house, with grandchildren living with her  EKG personally reviewed by myself on todays visit EKG Interpretation Date/Time:  Tuesday May 07 2023 11:33:28 EDT Ventricular Rate:  64 PR Interval:  144 QRS Duration:  96 QT Interval:  418 QTC Calculation: 431 R Axis:   -34  Text Interpretation: Normal sinus rhythm Left axis deviation Moderate voltage criteria for LVH, may be normal variant ( R in aVL , Cornell product ) When compared with ECG of  13-Nov-2021 13:10, PREVIOUS ECG IS PRESENT Confirmed by Julien Nordmann [phone]) on 05/07/2023 12:17:26 PM   Other past medical history reviewed Echo 12/2021 Echocardiogram Normal left ventricle function Moderate pulmonary hypertension Moderate, possibly moderate to severe mitral valve regurgitation TAVR valve functioning well  Lab work reviewed A1c 6.1 HGB 10.1 CR 1.03 Total chol 141  Other past medical history reviewed Hallucinations last month Seen by neurology memory changes several years ago, worse in the past 2-3 years MRI brain shows diffuse atrophy, chronic microvascular disease. Symptoms suggestive of Alzheimer's disease with behavioral disturbance per neuro  Carotid: Nonobstructive Reports echo was done: not available Reports it was done through [facility] imaging, no report or images available for review  echocardiogram  moderately elevated right heart pressures, normal prosthetic aortic valve, moderate MR and ejection fraction 60%   PMH:   has a past medical history of Anemia, Anxiety, Arthritis, Asthma, Cervical cancer (HCC), Chronic UTI, COPD (chronic obstructive pulmonary disease) (HCC), Coronary artery disease, Depression, Heart murmur, Hip fracture (HCC), Hyperlipidemia, Low blood sugar, Migraine, PONV (postoperative nausea and vomiting), and Skin cancer.  PSH:    Past Surgical History:  Procedure Laterality Date   APPENDECTOMY     CARDIAC CATHETERIZATION  02/2017   CATARACT EXTRACTION W/ INTRAOCULAR LENS  IMPLANT, BILATERAL Bilateral    CORONARY ANGIOPLASTY WITH STENT PLACEMENT  03/11/2017   CORONARY STENT INTERVENTION N/A 03/11/2017   Procedure: Coronary Stent Intervention;  Surgeon: Tonny Bollman, MD;  Location: [facility] INVASIVE  CV LAB;  Service: Cardiovascular;  Laterality: N/A;   IR RADIOLOGY PERIPHERAL GUIDED IV START  02/26/2017   IR US GUIDE VASC ACCESS RIGHT  02/26/2017   KNEE ARTHROSCOPY Right 03/09/2021   Procedure: RIGHT KNEE ARTHROSCOPY WITH PARTIAL MEDIAL  AND LATERAL MENISCECTOMIES;  Surgeon: Kathryne Hitch, MD;  Location: [facility];  Service: Orthopedics;  Laterality: Right;   RIGHT/LEFT HEART CATH AND CORONARY ANGIOGRAPHY N/A 02/20/2017   Procedure: Right/Left Heart Cath and Coronary Angiography;  Surgeon: Tonny Bollman, MD;  Location: [facility] INVASIVE CV LAB;  Service: Cardiovascular;  Laterality: N/A;   SKIN CANCER EXCISION     "back"   TEE WITHOUT CARDIOVERSION N/A 04/09/2017   Procedure: TRANSESOPHAGEAL ECHOCARDIOGRAM (TEE);  Surgeon: Tonny Bollman, MD;  Location: [facility] OR;  Service: Open Heart Surgery;  Laterality: N/A;   TRANSCATHETER AORTIC VALVE REPLACEMENT, TRANSFEMORAL N/A 04/09/2017   Procedure: TRANSCATHETER AORTIC VALVE REPLACEMENT, TRANSFEMORAL;  Surgeon: Tonny Bollman, MD;  Location: [facility] OR;  Service: Open Heart Surgery;  Laterality: N/A;   VAGINAL HYSTERECTOMY      Current Outpatient Medications  Medication Sig Dispense Refill   albuterol (VENTOLIN HFA) 108 (90 Base) MCG/ACT inhaler Inhale 2 puffs into the lungs every 6 (six) hours as needed for wheezing or shortness of breath. 1 each 3   aspirin 81 MG EC tablet Take 162 mg by mouth daily. Swallow whole.     calcium-vitamin D (OSCAL WITH D) 500-200 MG-UNIT tablet Take 1 tablet by mouth 2 (two) times daily.     celecoxib (CELEBREX) 100 MG capsule Take 1 capsule (100 mg total) by mouth 2 (two) times daily between meals as needed for pain. 60 capsule 1   Cholecalciferol (VITAMIN D3) 1000 units CAPS Take 1,000 Units by mouth daily with lunch.      furosemide (LASIX) 20 MG tablet Take 1 tablet (20 mg total) by mouth every Monday, Wednesday, and Friday. 36 tablet 3   metoprolol tartrate (LOPRESSOR) 25 MG tablet Take 1/2 tablet (12.5 mg) by mouth twice a day 90 tablet 3   Multiple Vitamins-Minerals (ICAPS AREDS 2 PO) Take 1 capsule by mouth 2 (two) times daily.     nitrofurantoin (MACRODANTIN) 100 MG capsule Take 1 capsule (100 mg total) by mouth daily. 90 capsule 0    nitroGLYCERIN (NITROSTAT) 0.4 MG SL tablet DISSOLVE ONE TABLET UNDER THE TONGUE EVERY 5 MINUTES AS NEEDED FOR CHEST PAIN.  DO NOT EXCEED A TOTAL OF 3 DOSES IN 15 MINUTES 25 tablet 3   rosuvastatin (CRESTOR) 20 MG tablet Take 20 mg by mouth at bedtime.     vitamin B-12 (CYANOCOBALAMIN) 500 MCG tablet Take 1 tablet (500 mcg total) by mouth daily. 90 tablet 0   No current facility-administered medications for this visit.    Allergies:   Latex, Lisinopril, Penicillins, Pravachol [pravastatin sodium], Pravastatin, Alendronate sodium, Amoxicillin, Other, Propoxyphene, Rosuvastatin, Sulfamethoxazole-trimethoprim, Budesonide-formoterol fumarate, Bupropion, Codeine, Fluoxetine, Morphine, Morphine and codeine, and Prozac [fluoxetine hcl]   Social History:  The patient  reports that she has never smoked. She has never used smokeless tobacco. She reports that she does not drink alcohol and does not use drugs.   Family History:   family history is not on file.    Review of Systems: Review of Systems  Constitutional: Negative.   Respiratory: Negative.    Cardiovascular: Negative.   Gastrointestinal: Negative.   Musculoskeletal: Negative.   Neurological: Negative.   Psychiatric/Behavioral: Negative.    All other systems reviewed and are negative.  PHYSICAL EXAM: VS:  BP 120/62 (BP Location: Left Arm, Patient Position: Sitting, Cuff Size: Normal)   Pulse 64   Ht 5\' 6"  (1.676 m)   Wt 150 lb 6 oz (68.2 kg)   SpO2 96%   BMI 24.27 kg/m  , BMI Body mass index is 24.27 kg/m. Constitutional:  oriented to person, place, and time. No distress.  HENT:  Head: Grossly normal Eyes:  no discharge. No scleral icterus.  Neck: No JVD, no carotid bruits  Cardiovascular: Regular rate and rhythm, no murmurs appreciated Pulmonary/Chest: Clear to auscultation bilaterally, no wheezes or rails Abdominal: Soft.  no distension.  no tenderness.  Musculoskeletal: Normal range of motion Neurological:  normal muscle  tone. Coordination normal. No atrophy Skin: Skin warm and dry Psychiatric: normal affect, pleasant  Recent Labs: 07/25/2022: ALT 10; BUN 18; Creatinine, Ser 1.03; Hemoglobin 10.2; Platelets 215; Potassium 4.7; Sodium 139; TSH 3.230    Lipid Panel Lab Results  Component Value Date   CHOL 141 07/25/2022   HDL 41 07/25/2022   LDLCALC 77 07/25/2022   TRIG 131 07/25/2022      Wt Readings from Last 3 Encounters:  05/07/23 150 lb 6 oz (68.2 kg)  02/07/23 155 lb 1.9 oz (70.4 kg)  11/22/22 151 lb 6.4 oz (68.7 kg)      ASSESSMENT AND PLAN:  Chronic diastolic CHF (congestive heart failure) (HCC),  Appears euvolemic, continue Lasix every other day Monday Wednesday Friday In the past has had stable BMP Scheduled to see primary care next month, perhaps could have routine lab work at that time  Coronary artery disease with exertional angina (HCC) - Plan: EKG 12-Lead Currently with no symptoms of angina. No further workup at this time. Continue current medication regimen.  Severe aortic stenosis - Plan: EKG 12-Lead TAVR, no significant murmur on exam,  echo February 2023, Consider repeat in follow-up once a year, every other year should be fine given asymptomatic  S/P TAVR (transcatheter aortic valve replacement) Stable clinical exam, no further work-up at this time  Chronic fatigue Busy at home, helps to clean the house  Essential hypertension Spike in blood pressure last week in the setting of stress, family issues Lucila [place], granddaughter living with her causing stress, sister across the street causing stress Blood pressure came down on its own Recommend she take hydralazine 25 mg as needed for blood pressure that is sustained For recurrent episodes, may need anxiety medication to take as needed   Total encounter time more than 40 minutes Greater than 50% was spent in counseling and coordination of care with the patient    Orders Placed This Encounter  Procedures   EKG  12-Lead     Signed, Dossie Arbour, M.D., Ph.D. 05/07/2023  [facility] [place], [place] [phone]

## 2023-05-06 NOTE — Telephone Encounter (Signed)
Called and spoke with granddaughter. Per granddaughter patient's blood pressure was 198/96 on Friday. The patient also complained of weakness. Patient was evaluated by EMS and recommended to go to the hospital but she refused. EMS then recommended to the patient the she be seen by her Cardiologist.  Per granddaughter patient has not had any further symptoms since Friday. Granddaughter checked the patient's blood pressure on Saturday and she reported it was in the 140's. Appointment made to see Dr. Mariah Milling on 05/07/23.

## 2023-05-07 ENCOUNTER — Encounter: Payer: Self-pay | Admitting: Cardiovascular Disease

## 2023-05-07 ENCOUNTER — Ambulatory Visit: Payer: Medicare Other | Attending: Cardiovascular Disease | Admitting: Cardiovascular Disease

## 2023-05-07 VITALS — BP 120/62 | HR 64 | Ht 66.0 in | Wt 150.4 lb

## 2023-05-07 DIAGNOSIS — I272 Pulmonary hypertension, unspecified: Secondary | ICD-10-CM

## 2023-05-07 DIAGNOSIS — I5032 Chronic diastolic (congestive) heart failure: Secondary | ICD-10-CM

## 2023-05-07 DIAGNOSIS — I10 Essential (primary) hypertension: Secondary | ICD-10-CM

## 2023-05-07 DIAGNOSIS — Z952 Presence of prosthetic heart valve: Secondary | ICD-10-CM

## 2023-05-07 DIAGNOSIS — I25118 Atherosclerotic heart disease of native coronary artery with other forms of angina pectoris: Secondary | ICD-10-CM | POA: Diagnosis not present

## 2023-05-07 DIAGNOSIS — I340 Nonrheumatic mitral (valve) insufficiency: Secondary | ICD-10-CM

## 2023-05-07 DIAGNOSIS — I350 Nonrheumatic aortic (valve) stenosis: Secondary | ICD-10-CM | POA: Diagnosis not present

## 2023-05-07 DIAGNOSIS — I359 Nonrheumatic aortic valve disorder, unspecified: Secondary | ICD-10-CM

## 2023-05-07 MED ORDER — HYDRALAZINE HCL 25 MG PO TABS: 25.0000 mg | ORAL_TABLET | Freq: Every day | ORAL | 3 refills | Status: AC | PRN

## 2023-05-07 NOTE — Patient Instructions (Addendum)
Medication Instructions:  Hydralazine 25 mg daily as needed for pressure >150 systolic If blood pressure remains elevated after 2 hours, take a second hydralazine  If you need a refill on your cardiac medications before your next appointment, please call your pharmacy.   Lab work: No new labs needed  Testing/Procedures: No new testing needed  Follow-Up: At [facility], you and your health needs are our priority.  As part of our continuing mission to provide you with exceptional heart care, we have created designated Provider Care Teams.  These Care Teams include your primary Cardiologist (physician) and Advanced Practice Providers (APPs -  Physician Assistants and Nurse Practitioners) who all work together to provide you with the care you need, when you need it.  You will need a follow up appointment in 6 months  Providers on your designated Care Team:   Nicolasa Ducking, NP Eula Listen, PA-C Cadence Fransico Michael, [place]  COVID-19 Vaccine Information can be found at: [email or web address] For questions related to vaccine distribution or appointments, please email [email or web address] or call [phone].

## 2023-05-16 ENCOUNTER — Other Ambulatory Visit: Payer: Self-pay | Admitting: Nurse Practitioner

## 2023-05-24 ENCOUNTER — Other Ambulatory Visit: Payer: Self-pay | Admitting: Family Medicine

## 2023-06-11 ENCOUNTER — Encounter: Payer: Medicare Other | Admitting: Nurse Practitioner

## 2023-06-11 ENCOUNTER — Ambulatory Visit (INDEPENDENT_AMBULATORY_CARE_PROVIDER_SITE_OTHER): Payer: Medicare Other

## 2023-06-11 VITALS — Ht 66.0 in | Wt 150.0 lb

## 2023-06-11 DIAGNOSIS — Z0000 Encounter for general adult medical examination without abnormal findings: Secondary | ICD-10-CM | POA: Diagnosis not present

## 2023-06-11 NOTE — Progress Notes (Signed)
Subjective:   Amy Kaiser is a 89 y.o. female who presents for Medicare Annual (Subsequent) preventive examination.  Visit Complete: Virtual  I connected with  Garfield Cornea on 06/11/23 by a audio enabled telemedicine application and verified that I am speaking with the correct person using two identifiers.  Patient Location: Home  Provider Location: Home Office  I discussed the limitations of evaluation and management by telemedicine. The patient expressed understanding and agreed to proceed.  Patient Medicare AWV questionnaire was completed by the patient on 06/10/23; I have confirmed that all information answered by patient is correct and no changes since this date.  Review of Systems    Vital Signs: Unable to obtain new vitals due to this being a telehealth visit.  Cardiac Risk Factors include: advanced age (>59men, >42 women);hypertension     Objective:    Today's Vitals   06/11/23 1003  Weight: 150 lb (68 kg)  Height: 5\' 6"  (1.676 m)   Body mass index is 24.21 kg/m.     06/11/2023   10:22 AM 11/22/2022    9:59 AM 11/22/2021    9:30 AM 11/13/2021    1:36 PM 05/17/2021    2:51 PM 03/09/2021    6:50 AM 03/02/2021   11:57 AM  Advanced Directives  Does Patient Have a Medical Advance Directive? Yes Yes Yes No Yes Yes Yes  Type of Estate agent of [place];Living will Healthcare Power of [employer] of [place];Living will Healthcare Power of [place];Living will  Does patient want to make changes to medical advance directive?       No - Patient declined  Copy of Healthcare Power of Attorney in Chart? No - copy requested          Current Medications (verified) Outpatient Encounter Medications as of 06/11/2023  Medication Sig   albuterol (VENTOLIN HFA) 108 (90 Base) MCG/ACT inhaler Inhale 2 puffs into the lungs every 6 (six) hours as needed for wheezing or shortness of breath.   aspirin 81 MG EC tablet Take 162 mg by mouth daily.  Swallow whole.   calcium-vitamin D (OSCAL WITH D) 500-200 MG-UNIT tablet Take 1 tablet by mouth 2 (two) times daily.   celecoxib (CELEBREX) 100 MG capsule Take 1 capsule (100 mg total) by mouth 2 (two) times daily between meals as needed for pain.   Cholecalciferol (VITAMIN D3) 1000 units CAPS Take 1,000 Units by mouth daily with lunch.    furosemide (LASIX) 20 MG tablet Take 1 tablet (20 mg total) by mouth every Monday, Wednesday, and Friday.   hydrALAZINE (APRESOLINE) 25 MG tablet Take 1 tablet (25 mg total) by mouth daily as needed. For systolic blood pressure greater then 150. If blood pressure remains elevated after two hours may take a second hydralazine.   metoprolol tartrate (LOPRESSOR) 25 MG tablet Take 1/2 tablet (12.5 mg) by mouth twice a day   Multiple Vitamins-Minerals (ICAPS AREDS 2 PO) Take 1 capsule by mouth 2 (two) times daily.   nitrofurantoin (MACRODANTIN) 100 MG capsule Take 1 capsule (100 mg total) by mouth daily.   nitroGLYCERIN (NITROSTAT) 0.4 MG SL tablet DISSOLVE ONE TABLET UNDER THE TONGUE EVERY 5 MINUTES AS NEEDED FOR CHEST PAIN.  DO NOT EXCEED A TOTAL OF 3 DOSES IN 15 MINUTES   rosuvastatin (CRESTOR) 20 MG tablet Take 20 mg by mouth at bedtime.   vitamin B-12 (CYANOCOBALAMIN) 500 MCG tablet Take 1 tablet (500 mcg total) by mouth daily.  No facility-administered encounter medications on file as of 06/11/2023.    Allergies (verified) Latex, Lisinopril, Penicillins, Pravachol [pravastatin sodium], Pravastatin, Alendronate sodium, Amoxicillin, Other, Propoxyphene, Rosuvastatin, Sulfamethoxazole-trimethoprim, Budesonide-formoterol fumarate, Bupropion, Codeine, Fluoxetine, Morphine, Morphine and codeine, and Prozac [fluoxetine hcl]   History: Past Medical History:  Diagnosis Date   Anemia    Anxiety    Arthritis    "just about all over" (03/11/2017)   Asthma    Cervical cancer (HCC)    Chronic UTI    COPD (chronic obstructive pulmonary disease) (HCC)    Coronary  artery disease    Depression    Heart murmur    Hip fracture (HCC)    left hip   Hyperlipidemia    Low blood sugar    Migraine    "work related; quit when I quit work in ~ 1997" (03/11/2017)   PONV (postoperative nausea and vomiting)    Skin cancer    back   Past Surgical History:  Procedure Laterality Date   APPENDECTOMY     CARDIAC CATHETERIZATION  02/2017   CATARACT EXTRACTION W/ INTRAOCULAR LENS  IMPLANT, BILATERAL Bilateral    CORONARY ANGIOPLASTY WITH STENT PLACEMENT  03/11/2017   CORONARY STENT INTERVENTION N/A 03/11/2017   Procedure: Coronary Stent Intervention;  Surgeon: Tonny Bollman, MD;  Location[facility] INVASIVE CV LAB;  Service: Cardiovascular;  Laterality: N/A;   IR RADIOLOGY PERIPHERAL GUIDED IV START  02/26/2017   IR US GUIDE VASC ACCESS RIGHT  02/26/2017   KNEE ARTHROSCOPY Right 03/09/2021   Procedure: RIGHT KNEE ARTHROSCOPY WITH PARTIAL MEDIAL AND LATERAL MENISCECTOMIES;  Surgeon: Kathryne Hitch, MD;  Location: [facility];  Service: Orthopedics;  Laterality: Right;   RIGHT/LEFT HEART CATH AND CORONARY ANGIOGRAPHY N/A 02/20/2017   Procedure: Right/Left Heart Cath and Coronary Angiography;  Surgeon: Tonny Bollman, MD;  Location: [facility] INVASIVE CV LAB;  Service: Cardiovascular;  Laterality: N/A;   SKIN CANCER EXCISION     "back"   TEE WITHOUT CARDIOVERSION N/A 04/09/2017   Procedure: TRANSESOPHAGEAL ECHOCARDIOGRAM (TEE);  Surgeon: Tonny Bollman, MD;  Location: [facility] OR;  Service: Open Heart Surgery;  Laterality: N/A;   TRANSCATHETER AORTIC VALVE REPLACEMENT, TRANSFEMORAL N/A 04/09/2017   Procedure: TRANSCATHETER AORTIC VALVE REPLACEMENT, TRANSFEMORAL;  Surgeon: Tonny Bollman, MD;  Location: [facility] OR;  Service: Open Heart Surgery;  Laterality: N/A;   VAGINAL HYSTERECTOMY     History reviewed. No pertinent family history. Social History   Socioeconomic History   Marital status: Widowed    Spouse name: Not on file   Number of children: 3   Years of  education: Not on file   Highest education level: Not on file  Occupational History   Not on file  Tobacco Use   Smoking status: Never   Smokeless tobacco: Never  Vaping Use   Vaping status: Never Used  Substance and Sexual Activity   Alcohol use: No   Drug use: No   Sexual activity: Not Currently  Other Topics Concern   Not on file  Social History Narrative   Right handed    Lives with grand daughter       Social Determinants of Health   Financial Resource Strain: Low Risk  (06/11/2023)   Overall Financial Resource Strain (CARDIA)    Difficulty of Paying Living Expenses: Not hard at all  Food Insecurity: No Food Insecurity (06/11/2023)   Hunger Vital Sign    Worried About Running Out of Food in the Last Year: Never true    Ran Out  of Food in the Last Year: Never true  Transportation Needs: No Transportation Needs (06/11/2023)   PRAPARE - Administrator, Civil Service (Medical): No    Lack of Transportation (Non-Medical): No  Physical Activity: Insufficiently Active (06/11/2023)   Exercise Vital Sign    Days of Exercise per Week: 5 days    Minutes of Exercise per Session: 20 min  Stress: No Stress Concern Present (06/11/2023)   [employer] of Occupational Health - Occupational Stress Questionnaire    Feeling of Stress : Not at all  Social Connections: Moderately Integrated (06/11/2023)   Social Connection and Isolation Panel [NHANES]    Frequency of Communication with Friends and Family: More than three times a week    Frequency of Social Gatherings with Friends and Family: More than three times a week    Attends Religious Services: More than 4 times per year    Active Member of [employer] or Organizations: Yes    Attends Banker Meetings: More than 4 times per year    Marital Status: Widowed    Tobacco Counseling Counseling given: Not Answered   Clinical Intake:  Pre-visit preparation completed: Yes  Pain : No/denies pain     BMI - recorded:  24.21 Nutritional Status: BMI of 19-24  Normal Nutritional Risks: None Diabetes: No  How often do you need to have someone help you when you read instructions, pamphlets, or other written materials from your doctor or pharmacy?: 1 - Never  Interpreter Needed?: No  Information entered by :: Theresa Mulligan LPN   Activities of Daily Living    06/11/2023   10:18 AM 06/11/2023   10:16 AM  In your present state of health, do you have any difficulty performing the following activities:  Hearing? 0 0  Vision? 1 0  Difficulty concentrating or making decisions? 1 0  Walking or climbing stairs? 1 1  Comment  Uses a cane  Dressing or bathing? 0 1  Comment  Granddaughter assist  Doing errands, shopping? 1 1  Comment  Granddaughter Ship broker and eating ?  N  Using the Toilet?  N  In the past six months, have you accidently leaked urine?  N  Do you have problems with loss of bowel control?  N  Managing your Medications?  N  Comment  Granddaughter assist  Managing your Finances?  Y  Comment  Granddaughter Product/process development scientist or managing your Housekeeping?  Y  Comment  Granddaughter assist    Patient Care Team: Melida Quitter, PA as PCP - General (Family Medicine) Laurann Montana, MD (Family Medicine) Van Clines, MD as Consulting Physician (Neurology) Antonieta Iba, MD as Consulting Physician (Cardiology)  Indicate any recent Medical Services you may have received from other than [facility] providers in the past year (date may be approximate).     Assessment:   This is a routine wellness examination for Amy Kaiser.  Hearing/Vision screen Hearing Screening - Comments:: Denies hearing difficulties   Vision Screening - Comments:: Wears reading glasses - up to date with routine eye exams with    Dietary issues and exercise activities discussed:     Goals Addressed               This Visit's Progress     Pace myself to prevent falls (pt-stated)          Depression Screen    06/11/2023   10:14 AM 02/07/2023   11:08 AM 08/30/2022  11:48 AM 07/24/2022   11:24 AM 04/05/2022   10:17 AM  PHQ 2/9 Scores  PHQ - 2 Score 0 2 1 1  0  PHQ- 9 Score  11  5 3     Fall Risk    06/11/2023   10:20 AM 06/10/2023    1:31 PM 11/22/2022    9:59 AM 11/22/2021    9:29 AM 05/17/2021    2:51 PM  Fall Risk   Falls in the past year? 0 0 0 1 0  Number falls in past yr: 0  0 1   Injury with Fall? 0  0 1   Risk for fall due to : No Fall Risks      Follow up Falls prevention discussed  Falls evaluation completed      MEDICARE RISK AT HOME:  Medicare Risk at Home - 06/11/23 1027     Any stairs in or around the home? Yes    If so, are there any without handrails? No    Home free of loose throw rugs in walkways, pet beds, electrical cords, etc? Yes    Adequate lighting in your home to reduce risk of falls? Yes    Life alert? No    Use of a cane, walker or w/c? Yes    Grab bars in the bathroom? Yes    Shower chair or bench in shower? Yes    Elevated toilet seat or a handicapped toilet? Yes             TIMED UP AND GO:  Was the test performed?  No    Cognitive Function:    11/22/2022   10:00 AM 11/22/2021   12:00 PM  MMSE - Mini Mental State Exam  Orientation to time 4 5  Orientation to Place 4 5  Registration 3 3  Attention/ Calculation 5 2  Recall 1 2  Language- name 2 objects 2 2  Language- repeat 1 1  Language- follow 3 step command 3 3  Language- read & follow direction 1 1  Write a sentence 1 1  Copy design 1 1  Total score 26 26      09/09/2020    9:00 AM  Montreal Cognitive Assessment   Visuospatial/ Executive (0/5) 4  Naming (0/3) 2  Attention: Read list of digits (0/2) 1  Attention: Read list of letters (0/1) 0  Attention: Serial 7 subtraction starting at 100 (0/3) 0  Language: Repeat phrase (0/2) 2  Language : Fluency (0/1) 1  Abstraction (0/2) 2  Delayed Recall (0/5) 0  Orientation (0/6) 6  Total 18  Adjusted Score  (based on education) 19      06/11/2023   10:23 AM  6CIT Screen  What Year? 0 points  What month? 0 points  What time? 0 points  Count back from 20 0 points  Months in reverse 4 points  Repeat phrase 0 points  Total Score 4 points    Immunizations Immunization History  Administered Date(s) Administered   Fluad Quad(high Dose 65+) 10/18/2020, 10/02/2022   Influenza Split 08/06/2008, 08/24/2010   Influenza,inj,Quad PF,6+ Mos 10/13/2019   Influenza,inj,quad, With Preservative 08/03/2011, 07/23/2013, 08/01/2015, 08/30/2016, 09/03/2017, 09/05/2018   Influenza-Unspecified 09/11/2012, 08/19/2014   PFIZER(Purple Top)SARS-COV-2 Vaccination 12/31/2019, 01/27/2020   Pneumococcal Conjugate-13 09/24/2016   Pneumococcal Polysaccharide-23 10/06/2007   Tdap 04/03/2012, 11/25/2021   Zoster, Live 04/03/2012    TDAP status: Up to date  Flu Vaccine status: Due, Education has been provided regarding the importance of this vaccine.  Advised may receive this vaccine at local pharmacy or Health Dept. Aware to provide a copy of the vaccination record if obtained from local pharmacy or Health Dept. Verbalized acceptance and understanding.  Pneumococcal vaccine status: Up to date  Covid-19 vaccine status: Completed vaccines  Qualifies for Shingles Vaccine? Yes   Zostavax completed No   Shingrix Completed?: No.    Education has been provided regarding the importance of this vaccine. Patient has been advised to call insurance company to determine out of pocket expense if they have not yet received this vaccine. Advised may also receive vaccine at local pharmacy or Health Dept. Verbalized acceptance and understanding.  Screening Tests Health Maintenance  Topic Date Due   Zoster Vaccines- Shingrix (1 of 2) 03/29/1948   COVID-19 Vaccine (3 - Pfizer risk series) 02/24/2020   INFLUENZA VACCINE  06/06/2023   DEXA SCAN  12/10/2023 (Originally 03/29/1996)   Medicare Annual Wellness (AWV)  06/10/2024    DTaP/Tdap/Td (3 - Td or Tdap) 11/26/2031   Pneumonia Vaccine 45+ Years old  Completed   HPV VACCINES  Aged Out    Health Maintenance  Health Maintenance Due  Topic Date Due   Zoster Vaccines- Shingrix (1 of 2) 03/29/1948   COVID-19 Vaccine (3 - Pfizer risk series) 02/24/2020   INFLUENZA VACCINE  06/06/2023    Colorectal cancer screening: No longer required.   Mammogram status: No longer required due to Age.  Bone Density status: Ordered Patient declined. Pt provided with contact info and advised to call to schedule appt.  Lung Cancer Screening: (Low Dose CT Chest recommended if Age 53-80 years, 20 pack-year currently smoking OR have quit w/in 15years.) does not qualify.     Additional Screening:  Hepatitis C Screening: does not qualify; Completed   Vision Screening: Recommended annual ophthalmology exams for early detection of glaucoma and other disorders of the eye. Is the patient up to date with their annual eye exam?  Yes  Who is the provider or what is the name of the office in which the patient attends annual eye exams? Dr Dione Booze If pt is not established with a provider, would they like to be referred to a provider to establish care? No .   Dental Screening: Recommended annual dental exams for proper oral hygiene    Community Resource Referral / Chronic Care Management:  CRR required this visit?  No   CCM required this visit?  No     Plan:     I have personally reviewed and noted the following in the patient's chart:   Medical and social history Use of alcohol, tobacco or illicit drugs  Current medications and supplements including opioid prescriptions. Patient is not currently taking opioid prescriptions. Functional ability and status Nutritional status Physical activity Advanced directives List of other physicians Hospitalizations, surgeries, and ER visits in previous 12 months Vitals Screenings to include cognitive, depression, and falls Referrals  and appointments  In addition, I have reviewed and discussed with patient certain preventive protocols, quality metrics, and best practice recommendations. A written personalized care plan for preventive services as well as general preventive health recommendations were provided to patient.     Tillie Rung, LPN   11/10/1094   After Visit Summary: (MyChart) Due to this being a telephonic visit, the after visit summary with patients personalized plan was offered to patient via MyChart   Nurse Notes: None

## 2023-06-11 NOTE — Patient Instructions (Addendum)
Amy Kaiser , Thank you for taking time to come for your Medicare Wellness Visit. I appreciate your ongoing commitment to your health goals. Please review the following plan we discussed and let me know if I can assist you in the future.   Referrals/Orders/Follow-Ups/Clinician Recommendations:   This is a list of the screening recommended for you and due dates:  Health Maintenance  Topic Date Due   Zoster (Shingles) Vaccine (1 of 2) 03/29/1948   COVID-19 Vaccine (3 - Pfizer risk series) 02/24/2020   Flu Shot  06/06/2023   DEXA scan (bone density measurement)  12/10/2023*   Medicare Annual Wellness Visit  06/10/2024   DTaP/Tdap/Td vaccine (3 - Td or Tdap) 11/26/2031   Pneumonia Vaccine  Completed   HPV Vaccine  Aged Out  *Topic was postponed. The date shown is not the original due date.    Advanced directives: (Copy Requested) Please bring a copy of your health care power of attorney and living will to the office to be added to your chart at your convenience.  Next Medicare Annual Wellness Visit scheduled for next year: Yes  Preventive Care 9 Years and Older, Female Preventive care refers to lifestyle choices and visits with your health care provider that can promote health and wellness. What does preventive care include? A yearly physical exam. This is also called an annual well check. Dental exams once or twice a year. Routine eye exams. Ask your health care provider how often you should have your eyes checked. Personal lifestyle choices, including: Daily care of your teeth and gums. Regular physical activity. Eating a healthy diet. Avoiding tobacco and drug use. Limiting alcohol use. Practicing safe sex. Taking low-dose aspirin every day. Taking vitamin and mineral supplements as recommended by your health care provider. What happens during an annual well check? The services and screenings done by your health care provider during your annual well check will depend on your  age, overall health, lifestyle risk factors, and family history of disease. Counseling  Your health care provider may ask you questions about your: Alcohol use. Tobacco use. Drug use. Emotional well-being. Home and relationship well-being. Sexual activity. Eating habits. History of falls. Memory and ability to understand (cognition). Work and work Astronomer. Reproductive health. Screening  You may have the following tests or measurements: Height, weight, and BMI. Blood pressure. Lipid and cholesterol levels. These may be checked every 5 years, or more frequently if you are over 102 years old. Skin check. Lung cancer screening. You may have this screening every year starting at age 18 if you have a 30-pack-year history of smoking and currently smoke or have quit within the past 15 years. Fecal occult blood test (FOBT) of the stool. You may have this test every year starting at age 8. Flexible sigmoidoscopy or colonoscopy. You may have a sigmoidoscopy every 5 years or a colonoscopy every 10 years starting at age 78. Hepatitis C blood test. Hepatitis B blood test. Sexually transmitted disease (STD) testing. Diabetes screening. This is done by checking your blood sugar (glucose) after you have not eaten for a while (fasting). You may have this done every 1-3 years. Bone density scan. This is done to screen for osteoporosis. You may have this done starting at age 67. Mammogram. This may be done every 1-2 years. Talk to your health care provider about how often you should have regular mammograms. Talk with your health care provider about your test results, treatment options, and if necessary, the need for more tests.  Vaccines  Your health care provider may recommend certain vaccines, such as: Influenza vaccine. This is recommended every year. Tetanus, diphtheria, and acellular pertussis (Tdap, Td) vaccine. You may need a Td booster every 10 years. Zoster vaccine. You may need this after  age 25. Pneumococcal 13-valent conjugate (PCV13) vaccine. One dose is recommended after age 45. Pneumococcal polysaccharide (PPSV23) vaccine. One dose is recommended after age 78. Talk to your health care provider about which screenings and vaccines you need and how often you need them. This information is not intended to replace advice given to you by your health care provider. Make sure you discuss any questions you have with your health care provider. Document Released: 11/18/2015 Document Revised: 07/11/2016 Document Reviewed: 08/23/2015 Elsevier Interactive Patient Education  2017 [employer].  Fall Prevention in the Home Falls can cause injuries. They can happen to people of all ages. There are many things you can do to make your home safe and to help prevent falls. What can I do on the outside of my home? Regularly fix the edges of walkways and driveways and fix any cracks. Remove anything that might make you trip as you walk through a door, such as a raised step or threshold. Trim any bushes or trees on the path to your home. Use bright outdoor lighting. Clear any walking paths of anything that might make someone trip, such as rocks or tools. Regularly check to see if handrails are loose or broken. Make sure that both sides of any steps have handrails. Any raised decks and porches should have guardrails on the edges. Have any leaves, snow, or ice cleared regularly. Use sand or salt on walking paths during winter. Clean up any spills in your garage right away. This includes oil or grease spills. What can I do in the bathroom? Use night lights. Install grab bars by the toilet and in the tub and shower. Do not use towel bars as grab bars. Use non-skid mats or decals in the tub or shower. If you need to sit down in the shower, use a plastic, non-slip stool. Keep the floor dry. Clean up any water that spills on the floor as soon as it happens. Remove soap buildup in the tub or shower  regularly. Attach bath mats securely with double-sided non-slip rug tape. Do not have throw rugs and other things on the floor that can make you trip. What can I do in the bedroom? Use night lights. Make sure that you have a light by your bed that is easy to reach. Do not use any sheets or blankets that are too big for your bed. They should not hang down onto the floor. Have a firm chair that has side arms. You can use this for support while you get dressed. Do not have throw rugs and other things on the floor that can make you trip. What can I do in the kitchen? Clean up any spills right away. Avoid walking on wet floors. Keep items that you use a lot in easy-to-reach places. If you need to reach something above you, use a strong step stool that has a grab bar. Keep electrical cords out of the way. Do not use floor polish or wax that makes floors slippery. If you must use wax, use non-skid floor wax. Do not have throw rugs and other things on the floor that can make you trip. What can I do with my stairs? Do not leave any items on the stairs. Make sure that  there are handrails on both sides of the stairs and use them. Fix handrails that are broken or loose. Make sure that handrails are as long as the stairways. Check any carpeting to make sure that it is firmly attached to the stairs. Fix any carpet that is loose or worn. Avoid having throw rugs at the top or bottom of the stairs. If you do have throw rugs, attach them to the floor with carpet tape. Make sure that you have a light switch at the top of the stairs and the bottom of the stairs. If you do not have them, ask someone to add them for you. What else can I do to help prevent falls? Wear shoes that: Do not have high heels. Have rubber bottoms. Are comfortable and fit you well. Are closed at the toe. Do not wear sandals. If you use a stepladder: Make sure that it is fully opened. Do not climb a closed stepladder. Make sure that  both sides of the stepladder are locked into place. Ask someone to hold it for you, if possible. Clearly mark and make sure that you can see: Any grab bars or handrails. First and last steps. Where the edge of each step is. Use tools that help you move around (mobility aids) if they are needed. These include: Canes. Walkers. Scooters. Crutches. Turn on the lights when you go into a dark area. Replace any light bulbs as soon as they burn out. Set up your furniture so you have a clear path. Avoid moving your furniture around. If any of your floors are uneven, fix them. If there are any pets around you, be aware of where they are. Review your medicines with your doctor. Some medicines can make you feel dizzy. This can increase your chance of falling. Ask your doctor what other things that you can do to help prevent falls. This information is not intended to replace advice given to you by your health care provider. Make sure you discuss any questions you have with your health care provider. Document Released: 08/18/2009 Document Revised: 03/29/2016 Document Reviewed: 11/26/2014 Elsevier Interactive Patient Education  2017 [employer].

## 2023-06-19 ENCOUNTER — Other Ambulatory Visit: Payer: Self-pay

## 2023-06-19 MED ORDER — ROSUVASTATIN CALCIUM 20 MG PO TABS: 20.0000 mg | ORAL_TABLET | Freq: Every day | ORAL | 0 refills | Status: DC

## 2023-07-24 ENCOUNTER — Encounter: Payer: Self-pay | Admitting: Family Medicine

## 2023-07-24 ENCOUNTER — Ambulatory Visit (INDEPENDENT_AMBULATORY_CARE_PROVIDER_SITE_OTHER): Payer: Medicare Other | Admitting: Family Medicine

## 2023-07-24 VITALS — BP 118/76 | HR 65 | Resp 18 | Ht 66.0 in | Wt 150.0 lb

## 2023-07-24 DIAGNOSIS — Z23 Encounter for immunization: Secondary | ICD-10-CM | POA: Diagnosis not present

## 2023-07-24 DIAGNOSIS — N390 Urinary tract infection, site not specified: Secondary | ICD-10-CM

## 2023-07-24 DIAGNOSIS — R300 Dysuria: Secondary | ICD-10-CM

## 2023-07-24 DIAGNOSIS — I10 Essential (primary) hypertension: Secondary | ICD-10-CM

## 2023-07-24 DIAGNOSIS — R7303 Prediabetes: Secondary | ICD-10-CM

## 2023-07-24 DIAGNOSIS — Z952 Presence of prosthetic heart valve: Secondary | ICD-10-CM

## 2023-07-24 LAB — POCT URINALYSIS DIP (CLINITEK)
Bilirubin, UA: NEGATIVE
Blood, UA: NEGATIVE
Glucose, UA: NEGATIVE mg/dL
Ketones, POC UA: NEGATIVE mg/dL
Leukocytes, UA: NEGATIVE
Nitrite, UA: NEGATIVE
POC PROTEIN,UA: NEGATIVE
Spec Grav, UA: 1.015 (ref 1.010–1.025)
Urobilinogen, UA: 0.2 U/dL
pH, UA: 6.5 (ref 5.0–8.0)

## 2023-07-24 MED ORDER — NITROFURANTOIN MACROCRYSTAL 100 MG PO CAPS
100.0000 mg | ORAL_CAPSULE | Freq: Every day | ORAL | 0 refills | Status: DC
Start: 2023-07-24 — End: 2023-11-12

## 2023-07-24 NOTE — Assessment & Plan Note (Signed)
UA negative for infection today.  Patient does endorse that she has not been drinking as much water the past couple of days.  Advised to increase water intake, may also take cranberry juice to alleviate symptoms.  Continue nitrofurantoin 100 mg once daily as chronic cystitis prophylaxis.  Will continue to monitor.

## 2023-07-24 NOTE — Assessment & Plan Note (Signed)
Stable, well controlled. Follow with cardiology. Continue furosemide 20 mg on M-W-F, metoprolol tartrate 25 mg daily, and hydralazine 25 mg as needed for BP >150/80. Will continue to monitor.

## 2023-07-24 NOTE — Progress Notes (Signed)
Established Patient Office Visit  Subjective   Patient ID: Amy Kaiser, female    DOB: 1929/07/23  Age: 89 y.o. MRN: [id number]  Chief Complaint  Patient presents with   Hypertension    HPI Amy Kaiser is a 89 y.o. female presenting today for follow up of hypertension, chronic UTI. Hypertension: Patient here for follow-up of elevated blood pressure.  Also followed by cardiology, most recent visit 7-24.  Pt denies chest pain, SOB, dizziness, edema, syncope, fatigue or heart palpitations. Taking metoprolol daily, furosemide Monday/Wednesday/Friday, and hydralazine as needed if blood pressures are over 150/80, reports excellent compliance with treatment. Denies side effects.  She has only needed to take hydralazine twice in the last couple of months. Chronic UTI: Has been taking nitrofurantoin 100 mg daily as chronic cystitis prophylaxis even prior to establishing care with this office.  Nitrofurantoin is not typically recommended for geriatric patients, however due to allergies to penicillins, Bactrim the other recommended antibiotics for prophylaxis.  She does endorse a burning sensation with urination and has brought a urine sample in a plastic bag to be tested.  Outpatient Medications Prior to Visit  Medication Sig   albuterol (VENTOLIN HFA) 108 (90 Base) MCG/ACT inhaler Inhale 2 puffs into the lungs every 6 (six) hours as needed for wheezing or shortness of breath.   aspirin 81 MG EC tablet Take 162 mg by mouth daily. Swallow whole.   calcium-vitamin D (OSCAL WITH D) 500-200 MG-UNIT tablet Take 1 tablet by mouth 2 (two) times daily.   celecoxib (CELEBREX) 100 MG capsule Take 1 capsule (100 mg total) by mouth 2 (two) times daily between meals as needed for pain.   Cholecalciferol (VITAMIN D3) 1000 units CAPS Take 1,000 Units by mouth daily with lunch.    furosemide (LASIX) 20 MG tablet Take 1 tablet (20 mg total) by mouth every Monday, Wednesday, and Friday.   hydrALAZINE  (APRESOLINE) 25 MG tablet Take 1 tablet (25 mg total) by mouth daily as needed. For systolic blood pressure greater then 150. If blood pressure remains elevated after two hours may take a second hydralazine.   metoprolol tartrate (LOPRESSOR) 25 MG tablet Take 1/2 tablet (12.5 mg) by mouth twice a day   Multiple Vitamins-Minerals (ICAPS AREDS 2 PO) Take 1 capsule by mouth 2 (two) times daily.   nitroGLYCERIN (NITROSTAT) 0.4 MG SL tablet DISSOLVE ONE TABLET UNDER THE TONGUE EVERY 5 MINUTES AS NEEDED FOR CHEST PAIN.  DO NOT EXCEED A TOTAL OF 3 DOSES IN 15 MINUTES   rosuvastatin (CRESTOR) 20 MG tablet Take 1 tablet (20 mg total) by mouth at bedtime.   vitamin B-12 (CYANOCOBALAMIN) 500 MCG tablet Take 1 tablet (500 mcg total) by mouth daily.   [DISCONTINUED] nitrofurantoin (MACRODANTIN) 100 MG capsule Take 1 capsule (100 mg total) by mouth daily.   No facility-administered medications prior to visit.    ROS Negative unless otherwise noted in HPI   Objective:     BP 118/76 (BP Location: Left Arm, Patient Position: Sitting, Cuff Size: Large)   Pulse 65   Resp 18   Ht 5\' 6"  (1.676 m)   Wt 150 lb (68 kg)   SpO2 96%   BMI 24.21 kg/m   Physical Exam Constitutional:      General: She is not in acute distress.    Appearance: Normal appearance.  HENT:     Head: Normocephalic and atraumatic.  Cardiovascular:     Rate and Rhythm: Normal rate and regular rhythm.  Heart sounds: No murmur heard.    No friction rub. No gallop.  Pulmonary:     Effort: Pulmonary effort is normal. No respiratory distress.     Breath sounds: No wheezing, rhonchi or rales.  Skin:    General: Skin is warm and dry.  Neurological:     Mental Status: She is alert and oriented to person, place, and time.    Results for orders placed or performed in visit on 07/24/23  POCT URINALYSIS DIP (CLINITEK)  Result Value Ref Range   Color, UA yellow yellow   Clarity, UA clear clear   Glucose, UA negative negative mg/dL    Bilirubin, UA negative negative   Ketones, POC UA negative negative mg/dL   Spec Grav, UA 4.010 2.725 - 1.025   Blood, UA negative negative   pH, UA 6.5 5.0 - 8.0   POC PROTEIN,UA negative negative, trace   Urobilinogen, UA 0.2 0.2 or 1.0 E.U./dL   Nitrite, UA Negative Negative   Leukocytes, UA Negative Negative    Assessment & Plan:  Essential hypertension Assessment & Plan:  Stable, well controlled. Follow with cardiology. Continue furosemide 20 mg on M-W-F, metoprolol tartrate 25 mg daily, and hydralazine 25 mg as needed for BP >150/80. Will continue to monitor.  Orders: -     CBC with Differential/Platelet; Future -     Comprehensive metabolic panel; Future -     Lipid panel; Future  Chronic urinary tract infection Assessment & Plan: UA negative for infection today.  Patient does endorse that she has not been drinking as much water the past couple of days.  Advised to increase water intake, may also take cranberry juice to alleviate symptoms.  Continue nitrofurantoin 100 mg once daily as chronic cystitis prophylaxis.  Will continue to monitor.  Orders: -     Nitrofurantoin Macrocrystal; Take 1 capsule (100 mg total) by mouth daily.  Dispense: 90 capsule; Refill: 0  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) -     Lipid panel; Future  Prediabetes -     Hemoglobin A1c; Future  Need for influenza vaccination -     Flu Vaccine Trivalent High Dose (Fluad)  Dysuria -     POCT URINALYSIS DIP (CLINITEK)    Return in about 6 months (around 01/21/2024) for follow-up for HLD, fasting blood work 1 week before.    Melida Quitter, PA

## 2023-07-24 NOTE — Patient Instructions (Signed)
You can get the COVID shot at your local pharmacy!

## 2023-08-20 ENCOUNTER — Other Ambulatory Visit: Payer: Medicare Other

## 2023-08-21 ENCOUNTER — Other Ambulatory Visit: Payer: Medicare Other

## 2023-08-21 DIAGNOSIS — Z952 Presence of prosthetic heart valve: Secondary | ICD-10-CM | POA: Diagnosis not present

## 2023-08-21 DIAGNOSIS — I10 Essential (primary) hypertension: Secondary | ICD-10-CM | POA: Diagnosis not present

## 2023-08-21 DIAGNOSIS — R7303 Prediabetes: Secondary | ICD-10-CM | POA: Diagnosis not present

## 2023-08-22 LAB — CBC WITH DIFFERENTIAL/PLATELET
Basophils Absolute: 0.1 10*3/uL (ref 0.0–0.2)
Basos: 1 %
EOS (ABSOLUTE): 0.8 10*3/uL — ABNORMAL HIGH (ref 0.0–0.4)
Eos: 9 %
Hematocrit: 31.6 % — ABNORMAL LOW (ref 34.0–46.6)
Hemoglobin: 10.1 g/dL — ABNORMAL LOW (ref 11.1–15.9)
Immature Grans (Abs): 0.1 10*3/uL (ref 0.0–0.1)
Immature Granulocytes: 1 %
Lymphocytes Absolute: 2.5 10*3/uL (ref 0.7–3.1)
Lymphs: 30 %
MCH: 30.5 pg (ref 26.6–33.0)
MCHC: 32 g/dL (ref 31.5–35.7)
MCV: 96 fL (ref 79–97)
Monocytes Absolute: 0.7 10*3/uL (ref 0.1–0.9)
Monocytes: 8 %
Neutrophils Absolute: 4.3 10*3/uL (ref 1.4–7.0)
Neutrophils: 51 %
Platelets: 258 10*3/uL (ref 150–450)
RBC: 3.31 x10E6/uL — ABNORMAL LOW (ref 3.77–5.28)
RDW: 12.5 % (ref 11.7–15.4)
WBC: 8.3 10*3/uL (ref 3.4–10.8)

## 2023-08-22 LAB — COMPREHENSIVE METABOLIC PANEL
ALT: 7 [IU]/L (ref 0–32)
AST: 103 [IU]/L — ABNORMAL HIGH (ref 0–40)
Albumin: 3.9 g/dL (ref 3.6–4.6)
Alkaline Phosphatase: 90 [IU]/L (ref 44–121)
BUN/Creatinine Ratio: 18 (ref 12–28)
BUN: 18 mg/dL (ref 10–36)
Bilirubin Total: 0.6 mg/dL (ref 0.0–1.2)
CO2: 25 mmol/L (ref 20–29)
Calcium: 8.6 mg/dL — ABNORMAL LOW (ref 8.7–10.3)
Chloride: 102 mmol/L (ref 96–106)
Creatinine, Ser: 0.98 mg/dL (ref 0.57–1.00)
Globulin, Total: 3.2 g/dL (ref 1.5–4.5)
Glucose: 96 mg/dL (ref 70–99)
Potassium: 4.8 mmol/L (ref 3.5–5.2)
Sodium: 140 mmol/L (ref 134–144)
Total Protein: 7.1 g/dL (ref 6.0–8.5)
eGFR: 54 mL/min/{1.73_m2} — ABNORMAL LOW (ref >59)

## 2023-08-22 LAB — HEMOGLOBIN A1C
Est. average glucose Bld gHb Est-mCnc: 123 mg/dL
Hgb A1c MFr Bld: 5.9 % — ABNORMAL HIGH (ref 4.8–5.6)

## 2023-08-22 LAB — LIPID PANEL
Chol/HDL Ratio: 3.5 {ratio} (ref 0.0–4.4)
Cholesterol, Total: 145 mg/dL (ref 100–199)
HDL: 42 mg/dL (ref >39)
LDL Chol Calc (NIH): 86 mg/dL (ref 0–99)
Triglycerides: 91 mg/dL (ref 0–149)
VLDL Cholesterol Cal: 17 mg/dL (ref 5–40)

## 2023-08-23 ENCOUNTER — Other Ambulatory Visit: Payer: Self-pay | Admitting: Family Medicine

## 2023-08-23 DIAGNOSIS — R748 Abnormal levels of other serum enzymes: Secondary | ICD-10-CM

## 2023-10-08 ENCOUNTER — Other Ambulatory Visit: Payer: Self-pay | Admitting: Family Medicine

## 2023-10-08 NOTE — Telephone Encounter (Signed)
Duplicate request

## 2023-10-21 ENCOUNTER — Ambulatory Visit: Payer: Medicare Other | Admitting: Orthopaedic Surgery

## 2023-10-21 ENCOUNTER — Other Ambulatory Visit (INDEPENDENT_AMBULATORY_CARE_PROVIDER_SITE_OTHER): Payer: Self-pay

## 2023-10-21 DIAGNOSIS — M25561 Pain in right knee: Secondary | ICD-10-CM | POA: Diagnosis not present

## 2023-10-21 DIAGNOSIS — G8929 Other chronic pain: Secondary | ICD-10-CM | POA: Diagnosis not present

## 2023-10-21 MED ORDER — LIDOCAINE HCL 1 % IJ SOLN
3.0000 mL | INTRAMUSCULAR | Status: AC | PRN
Start: 2023-10-21 — End: 2023-10-21
  Administered 2023-10-21: 3 mL

## 2023-10-21 MED ORDER — METHYLPREDNISOLONE ACETATE 40 MG/ML IJ SUSP
40.0000 mg | INTRAMUSCULAR | Status: AC | PRN
Start: 2023-10-21 — End: 2023-10-21
  Administered 2023-10-21: 40 mg via INTRA_ARTICULAR

## 2023-10-21 NOTE — Progress Notes (Signed)
The patient is a 88 year old female that we actually scoped her right knee several years ago for a meniscal tear.  She does not have daily knee pain but it does swell on occasion and has been hurting for a few weeks now after doing some shoveling.  She does ambulate using a cane.  On examination of her right knee today there is no effusion today.  She has good range of motion of the knee but some global tenderness.  2 views of the right knee still show well-maintained joint space with just mild to moderate arthritis.  There is no malalignment and no evidence of fracture.  Her bone is osteopenic.  I did recommend a steroid injection in her right knee today because I think that will help her the most.  She should continue to go slow with her knee and use a cane.  She can also try knee sleeve if things worsen.  She did tolerate the steroid injection well.  Follow-up can be as needed.    Procedure Note  Patient: Amy Kaiser             Date of Birth: 08/02/1929           MRN: [id number]             Visit Date: 10/21/2023  Procedures: Visit Diagnoses:  1. Chronic pain of right knee     Large Joint Inj: R knee on 10/21/2023 4:21 PM Indications: diagnostic evaluation and pain Details: 22 G 1.5 in needle, superolateral approach  Arthrogram: No  Medications: 3 mL lidocaine 1 %; 40 mg methylPREDNISolone acetate 40 MG/ML Outcome: tolerated well, no immediate complications Procedure, treatment alternatives, risks and benefits explained, specific risks discussed. Consent was given by the patient. Immediately prior to procedure a time out was called to verify the correct patient, procedure, equipment, support staff and site/side marked as required. Patient was prepped and draped in the usual sterile fashion.

## 2023-10-21 NOTE — Progress Notes (Signed)
Cardiology Office Note  Date:  10/22/2023   ID:  Amy Kaiser, DOB [birth year], MRN [id number]  PCP:  Melida Quitter, PA   Chief Complaint  Patient presents with   6 month follow up     Patient c/o pounding in head in the evenings that comes and goes at least once a month.     HPI:  Amy Kaiser is a 89 y.o. female with PMH of  severe aortic stenosis with progressive symptoms of diastolic heart failure  TAVR with a 20 mm Sapien 3 valve via a percutaneous transfemoral approach 04/09/2017.  severe stenosis of the proximal RCA  stenting with a 4 mm drug-eluting stent 03/11/2017.  Who presents for f/u of her TAVR  LOV July 2024 Presents today in a wheelchair Granddaughter lives with her, she is getting over cancer  Followed by orthopedics, recent knee injection on the right Two or three times a month, when she lays down at night, gets pounding in her head, "beating" in her head Not eating well, losing weight Stress at home Walks in the yard daily, gets mail, does ADLs Issues with anemia  Denies issues on Lasix 3 times a week  no leg swelling, no PND orthopnea  Labs reviewed: HGB 10.1 Total chol 145, LDL 86 CR 0.98 A1C 5.9  Last echocardiogram February 2023 Normal left ventricle function Moderate pulmonary hypertension Moderate, possibly moderate to severe mitral valve regurgitation TAVR valve functioning well  EKG personally reviewed by myself on todays visit EKG Interpretation Date/Time:  Tuesday October 22 2023 10:32:33 EST Ventricular Rate:  64 PR Interval:  146 QRS Duration:  90 QT Interval:  422 QTC Calculation: 435 R Axis:   -30  Text Interpretation: Normal sinus rhythm Left axis deviation Moderate voltage criteria for LVH, may be normal variant ( R in aVL , Cornell product ) When compared with ECG of 07-May-2023 11:33, No significant change was found Confirmed by Julien Nordmann [phone]) on 10/22/2023 10:45:14 AM   Other past medical history  reviewed Echo 12/2021 Echocardiogram Normal left ventricle function Moderate pulmonary hypertension Moderate, possibly moderate to severe mitral valve regurgitation TAVR valve functioning well  Lab work reviewed A1c 6.1 HGB 10.1 CR 1.03 Total chol 141  Prior history of hallucinations MRI brain shows diffuse atrophy, chronic microvascular disease. Symptoms suggestive of Alzheimer's disease with behavioral disturbance per neuro  Carotid: Nonobstructive Reports echo was done: not available Reports it was done through [facility], no report or images available for review    PMH:   has a past medical history of Anemia, Anxiety, Arthritis, Asthma, Cervical cancer (HCC), Chronic UTI, COPD (chronic obstructive pulmonary disease) (HCC), Coronary artery disease, Depression, Heart murmur, Hip fracture (HCC), Hyperlipidemia, Low blood sugar, Migraine, PONV (postoperative nausea and vomiting), and Skin cancer.  PSH:    Past Surgical History:  Procedure Laterality Date   APPENDECTOMY     CARDIAC CATHETERIZATION  02/2017   CATARACT EXTRACTION W/ INTRAOCULAR LENS  IMPLANT, BILATERAL Bilateral    CORONARY ANGIOPLASTY WITH STENT PLACEMENT  03/11/2017   CORONARY STENT INTERVENTION N/A 03/11/2017   Procedure: Coronary Stent Intervention;  Surgeon: Tonny Bollman, MD;  Location: [facility] INVASIVE CV LAB;  Service: Cardiovascular;  Laterality: N/A;   IR RADIOLOGY PERIPHERAL GUIDED IV START  02/26/2017   IR US GUIDE VASC ACCESS RIGHT  02/26/2017   KNEE ARTHROSCOPY Right 03/09/2021   Procedure: RIGHT KNEE ARTHROSCOPY WITH PARTIAL MEDIAL AND LATERAL MENISCECTOMIES;  Surgeon: Kathryne Hitch, MD;  Location: [facility];  Service: Orthopedics;  Laterality: Right;   RIGHT/LEFT HEART CATH AND CORONARY ANGIOGRAPHY N/A 02/20/2017   Procedure: Right/Left Heart Cath and Coronary Angiography;  Surgeon: Tonny Bollman, MD;  Location: [facility] INVASIVE CV LAB;  Service: Cardiovascular;  Laterality: N/A;    SKIN CANCER EXCISION     "back"   TEE WITHOUT CARDIOVERSION N/A 04/09/2017   Procedure: TRANSESOPHAGEAL ECHOCARDIOGRAM (TEE);  Surgeon: Tonny Bollman, MD;  Location: [facility];  Service: Open Heart Surgery;  Laterality: N/A;   TRANSCATHETER AORTIC VALVE REPLACEMENT, TRANSFEMORAL N/A 04/09/2017   Procedure: TRANSCATHETER AORTIC VALVE REPLACEMENT, TRANSFEMORAL;  Surgeon: Tonny Bollman, MD;  Location: [facility] OR;  Service: Open Heart Surgery;  Laterality: N/A;   VAGINAL HYSTERECTOMY      Current Outpatient Medications  Medication Sig Dispense Refill   albuterol (VENTOLIN HFA) 108 (90 Base) MCG/ACT inhaler Inhale 2 puffs into the lungs every 6 (six) hours as needed for wheezing or shortness of breath. 1 each 3   aspirin 81 MG EC tablet Take 162 mg by mouth daily. Swallow whole.     celecoxib (CELEBREX) 100 MG capsule Take 1 capsule (100 mg total) by mouth 2 (two) times daily between meals as needed for pain. 60 capsule 1   Cholecalciferol (VITAMIN D3) 1000 units CAPS Take 1,000 Units by mouth daily with lunch.      furosemide (LASIX) 20 MG tablet Take 1 tablet (20 mg total) by mouth every Monday, Wednesday, and Friday. 36 tablet 3   hydrALAZINE (APRESOLINE) 25 MG tablet Take 1 tablet (25 mg total) by mouth daily as needed. For systolic blood pressure greater then 150. If blood pressure remains elevated after two hours may take a second hydralazine. 180 tablet 3   metoprolol tartrate (LOPRESSOR) 25 MG tablet Take 1/2 tablet (12.5 mg) by mouth twice a day 90 tablet 3   Multiple Vitamins-Minerals (ICAPS AREDS 2 PO) Take 1 capsule by mouth 2 (two) times daily.     nitrofurantoin (MACRODANTIN) 100 MG capsule Take 1 capsule (100 mg total) by mouth daily. 90 capsule 0   nitroGLYCERIN (NITROSTAT) 0.4 MG SL tablet DISSOLVE ONE TABLET UNDER THE TONGUE EVERY 5 MINUTES AS NEEDED FOR CHEST PAIN.  DO NOT EXCEED A TOTAL OF 3 DOSES IN 15 MINUTES 25 tablet 3   rosuvastatin (CRESTOR) 20 MG tablet TAKE 1 TABLET BY MOUTH AT  BEDTIME 90 tablet 0   vitamin B-12 (CYANOCOBALAMIN) 500 MCG tablet Take 1 tablet (500 mcg total) by mouth daily. 90 tablet 0   No current facility-administered medications for this visit.    Allergies:   Latex, Lisinopril, Penicillins, Pravachol [pravastatin sodium], Pravastatin, Alendronate sodium, Amoxicillin, Other, Propoxyphene, Rosuvastatin, Sulfamethoxazole-trimethoprim, Budesonide-formoterol fumarate, Bupropion, Codeine, Fluoxetine, Morphine, Morphine and codeine, and Prozac [fluoxetine hcl]   Social History:  The patient  reports that she has never smoked. She has never used smokeless tobacco. She reports that she does not drink alcohol and does not use drugs.   Family History:   family history is not on file.    Review of Systems: Review of Systems  Constitutional: Negative.   Respiratory: Negative.    Cardiovascular: Negative.   Gastrointestinal: Negative.   Musculoskeletal: Negative.   Neurological: Negative.   Psychiatric/Behavioral: Negative.    All other systems reviewed and are negative.   PHYSICAL EXAM: VS:  BP 120/60 (BP Location: Left Arm, Patient Position: Sitting, Cuff Size: Normal)   Pulse 64   SpO2 97%  , BMI There is no height or weight on file  to calculate BMI. Constitutional:  oriented to person, place, and time. No distress.  HENT:  Head: Grossly normal Eyes:  no discharge. No scleral icterus.  Neck: No JVD, no carotid bruits  Cardiovascular: Regular rate and rhythm, no murmurs appreciated Pulmonary/Chest: Clear to auscultation bilaterally, no wheezes or rails Abdominal: Soft.  no distension.  no tenderness.  Musculoskeletal: Normal range of motion Neurological:  normal muscle tone. Coordination normal. No atrophy Skin: Skin warm and dry Psychiatric: normal affect, pleasant  Recent Labs: 08/21/2023: ALT 7; BUN 18; Creatinine, Ser 0.98; Hemoglobin 10.1; Platelets 258; Potassium 4.8; Sodium 140    Lipid Panel Lab Results  Component Value Date    CHOL 145 08/21/2023   HDL 42 08/21/2023   LDLCALC 86 08/21/2023   TRIG 91 08/21/2023      Wt Readings from Last 3 Encounters:  07/24/23 150 lb (68 kg)  06/11/23 150 lb (68 kg)  05/07/23 150 lb 6 oz (68.2 kg)     ASSESSMENT AND PLAN:  Chronic diastolic CHF (congestive heart failure) (HCC),  Appears euvolemic, recommend she continue Lasix 20 mg 3 days a week  Coronary artery disease with exertional angina (HCC) - Plan: EKG 12-Lead Currently with no symptoms of angina. No further workup at this time. Continue current medication regimen.  Severe aortic stenosis - Plan: EKG 12-Lead TAVR, no significant murmur on exam,  echo February 2023, Repeat echocardiogram ordered  S/P TAVR (transcatheter aortic valve replacement) Stable exam  Chronic fatigue Active, symptoms likely exacerbated by anemia  she is going to start eating liver, cannot tolerate iron pill May need to consider B12  Essential hypertension Blood pressure is well controlled on today's visit. No changes made to the medications.    Orders Placed This Encounter  Procedures   EKG 12-Lead     Signed, Dossie Arbour, M.D., Ph.D. 10/22/2023  [facility] [place], [place] [phone]

## 2023-10-22 ENCOUNTER — Ambulatory Visit: Payer: Medicare Other | Attending: Cardiovascular Disease | Admitting: Cardiovascular Disease

## 2023-10-22 ENCOUNTER — Encounter: Payer: Self-pay | Admitting: Cardiovascular Disease

## 2023-10-22 VITALS — BP 120/60 | HR 64

## 2023-10-22 DIAGNOSIS — I340 Nonrheumatic mitral (valve) insufficiency: Secondary | ICD-10-CM

## 2023-10-22 DIAGNOSIS — Z952 Presence of prosthetic heart valve: Secondary | ICD-10-CM

## 2023-10-22 DIAGNOSIS — I272 Pulmonary hypertension, unspecified: Secondary | ICD-10-CM

## 2023-10-22 DIAGNOSIS — I350 Nonrheumatic aortic (valve) stenosis: Secondary | ICD-10-CM

## 2023-10-22 DIAGNOSIS — I10 Essential (primary) hypertension: Secondary | ICD-10-CM | POA: Diagnosis not present

## 2023-10-22 DIAGNOSIS — I25118 Atherosclerotic heart disease of native coronary artery with other forms of angina pectoris: Secondary | ICD-10-CM | POA: Diagnosis not present

## 2023-10-22 DIAGNOSIS — I5032 Chronic diastolic (congestive) heart failure: Secondary | ICD-10-CM

## 2023-10-22 DIAGNOSIS — I493 Ventricular premature depolarization: Secondary | ICD-10-CM

## 2023-10-22 NOTE — Patient Instructions (Addendum)
Medication Instructions:  No changes  If you need a refill on your cardiac medications before your next appointment, please call your pharmacy.   Lab work: No new labs needed  Testing/Procedures: Echo for TAVR , aortic valve disease  Follow-Up: At [facility], you and your health needs are our priority.  As part of our continuing mission to provide you with exceptional heart care, we have created designated Provider Care Teams.  These Care Teams include your primary Cardiologist (physician) and Advanced Practice Providers (APPs -  Physician Assistants and Nurse Practitioners) who all work together to provide you with the care you need, when you need it.  You will need a follow up appointment in 12 months  Providers on your designated Care Team:   Nicolasa Ducking, NP Eula Listen, PA-C Cadence Fransico Michael, [place]  COVID-19 Vaccine Information can be found at: [email or web address] For questions related to vaccine distribution or appointments, please email [email or web address] or call [phone].

## 2023-11-12 ENCOUNTER — Other Ambulatory Visit: Payer: Self-pay | Admitting: Family Medicine

## 2023-11-12 ENCOUNTER — Other Ambulatory Visit: Payer: Self-pay | Admitting: Cardiovascular Disease

## 2023-11-12 DIAGNOSIS — N390 Urinary tract infection, site not specified: Secondary | ICD-10-CM

## 2023-11-12 MED ORDER — NITROFURANTOIN MACROCRYSTAL 100 MG PO CAPS: 100.0000 mg | ORAL_CAPSULE | Freq: Every day | ORAL | 0 refills | Status: DC

## 2023-12-12 ENCOUNTER — Encounter: Payer: Self-pay | Admitting: Family Medicine

## 2024-01-14 ENCOUNTER — Ambulatory Visit: Payer: Medicare Other

## 2024-01-16 ENCOUNTER — Other Ambulatory Visit: Payer: Medicare Other

## 2024-01-21 ENCOUNTER — Ambulatory Visit: Payer: Medicare Other | Admitting: Family Medicine

## 2024-01-25 ENCOUNTER — Other Ambulatory Visit: Payer: Self-pay | Admitting: Family Medicine

## 2024-01-25 ENCOUNTER — Other Ambulatory Visit: Payer: Self-pay | Admitting: Cardiovascular Disease

## 2024-01-28 MED ORDER — ROSUVASTATIN CALCIUM 20 MG PO TABS: 20.0000 mg | ORAL_TABLET | Freq: Every day | ORAL | 0 refills | Status: DC

## 2024-01-28 NOTE — Progress Notes (Unsigned)
   Established Patient Office Visit  Subjective   Patient ID: Amy Kaiser, female    DOB: [birth year]  Age: 89 y.o. MRN: [id number]  No chief complaint on file.   HPI  Elevated ast -repeat    The ASCVD Risk score (Arnett DK, et al., 2019) failed to calculate for the following reasons:   The 2019 ASCVD risk score is only valid for ages 57 to 76  Health Maintenance Due  Topic Date Due   Zoster Vaccines- Shingrix (1 of 2) 03/29/1950   DEXA SCAN  Never done   COVID-19 Vaccine (3 - Pfizer risk series) 02/24/2020      Objective:     There were no vitals taken for this visit. {Vitals History (Optional):23777}  Physical Exam   No results found for any visits on 01/29/24.      Assessment & Plan:   There are no diagnoses linked to this encounter.   No follow-ups on file.    Sandre Kitty, MD

## 2024-01-29 ENCOUNTER — Encounter: Payer: Self-pay | Admitting: Family Medicine

## 2024-01-29 ENCOUNTER — Ambulatory Visit (INDEPENDENT_AMBULATORY_CARE_PROVIDER_SITE_OTHER): Admitting: Family Medicine

## 2024-01-29 VITALS — BP 159/69 | HR 56 | Ht 66.0 in | Wt 146.4 lb

## 2024-01-29 DIAGNOSIS — I25118 Atherosclerotic heart disease of native coronary artery with other forms of angina pectoris: Secondary | ICD-10-CM

## 2024-01-29 DIAGNOSIS — R262 Difficulty in walking, not elsewhere classified: Secondary | ICD-10-CM

## 2024-01-29 DIAGNOSIS — D649 Anemia, unspecified: Secondary | ICD-10-CM

## 2024-01-29 DIAGNOSIS — R630 Anorexia: Secondary | ICD-10-CM | POA: Insufficient documentation

## 2024-01-29 DIAGNOSIS — G309 Alzheimer's disease, unspecified: Secondary | ICD-10-CM

## 2024-01-29 DIAGNOSIS — F324 Major depressive disorder, single episode, in partial remission: Secondary | ICD-10-CM

## 2024-01-29 DIAGNOSIS — F0280 Dementia in other diseases classified elsewhere without behavioral disturbance: Secondary | ICD-10-CM

## 2024-01-29 MED ORDER — MIRTAZAPINE 7.5 MG PO TABS
7.5000 mg | ORAL_TABLET | Freq: Every day | ORAL | 1 refills | Status: DC
Start: 2024-01-29 — End: 2024-03-10

## 2024-01-29 NOTE — Assessment & Plan Note (Signed)
 Encouraged use of nutritional supplements.  Prescribing Remeron 7.5 mg, increase to 15 as needed.  Nightly.

## 2024-01-29 NOTE — Assessment & Plan Note (Signed)
 Patient has multiple friends that have passed away recently.  Depression may be contributing to memory issues.  Prescribing Remeron for appetite which may help with mood.

## 2024-01-29 NOTE — Patient Instructions (Signed)
 It was nice to see you today,  We addressed the following topics today: -I am sending in a medication called Remeron.  Take this at night. - I am ordering some lab test.  You can go to [facility] to have these done.  Have a great day,  Frederic Jericho, MD

## 2024-01-29 NOTE — Assessment & Plan Note (Signed)
 Has had a MoCA a few years ago that was 19/30.  On exam her memory loss appears moderate and she can carry on a conversation and repeating questions was minimal.  Not on donepezil or Namenda.  Discussed these.  Patient does not want to be on these.  Will continue to offer help in the form of assistive medical equipment.  Consider referral to the [facility] guide program in the future.

## 2024-01-29 NOTE — Assessment & Plan Note (Signed)
 Follows with cardiology.  Has an echo upcoming.  Continue statin and aspirin

## 2024-01-30 DIAGNOSIS — R748 Abnormal levels of other serum enzymes: Secondary | ICD-10-CM | POA: Diagnosis not present

## 2024-01-31 LAB — COMPREHENSIVE METABOLIC PANEL WITH GFR
ALT: 7 IU/L (ref 0–32)
AST: 108 IU/L — ABNORMAL HIGH (ref 0–40)
Albumin: 4.2 g/dL (ref 3.6–4.6)
Alkaline Phosphatase: 79 IU/L (ref 44–121)
BUN/Creatinine Ratio: 18 (ref 12–28)
BUN: 22 mg/dL (ref 10–36)
Bilirubin Total: 0.6 mg/dL (ref 0.0–1.2)
CO2: 25 mmol/L (ref 20–29)
Calcium: 9.3 mg/dL (ref 8.7–10.3)
Chloride: 104 mmol/L (ref 96–106)
Creatinine, Ser: 1.19 mg/dL — ABNORMAL HIGH (ref 0.57–1.00)
Globulin, Total: 3.1 g/dL (ref 1.5–4.5)
Glucose: 109 mg/dL — ABNORMAL HIGH (ref 70–99)
Potassium: 4.5 mmol/L (ref 3.5–5.2)
Sodium: 144 mmol/L (ref 134–144)
Total Protein: 7.3 g/dL (ref 6.0–8.5)
eGFR: 43 mL/min/{1.73_m2} — ABNORMAL LOW (ref >59)

## 2024-02-05 ENCOUNTER — Ambulatory Visit: Attending: Cardiovascular Disease

## 2024-02-05 DIAGNOSIS — I350 Nonrheumatic aortic (valve) stenosis: Secondary | ICD-10-CM

## 2024-02-05 DIAGNOSIS — Z952 Presence of prosthetic heart valve: Secondary | ICD-10-CM | POA: Diagnosis not present

## 2024-02-05 LAB — ECHOCARDIOGRAM COMPLETE
AV Mean grad: 7 mmHg
AV Peak grad: 13.1 mmHg
Ao pk vel: 1.81 m/s
Area-P 1/2: 3.53 cm2
S' Lateral: 2.6 cm

## 2024-02-06 ENCOUNTER — Telehealth: Payer: Self-pay

## 2024-02-06 NOTE — Telephone Encounter (Signed)
-----   Message from Sandre Kitty sent at 02/06/2024  7:54 AM EDT ----- Creatinine mildly elevated.  AST continues to be elevated.  The labs placed on 3/26 were not obtained.  Please call the patient to let her know I would like to have her come in for another lab visit prior to her next visit in 6 weeks so that we can get the rest of the labs.

## 2024-02-06 NOTE — Telephone Encounter (Signed)
 Pt went to the [facility] for the lab draw but only was given the one Lab Req. I called the granddaughter to give her the results that were documented and also advised her that I would fax the other lab req to [facility] on [address] to be completed the week of April 21st.  Faxed confirmation received.

## 2024-02-11 ENCOUNTER — Encounter: Payer: Self-pay | Admitting: Cardiovascular Disease

## 2024-02-12 ENCOUNTER — Encounter: Payer: Self-pay | Admitting: Emergency Medicine

## 2024-02-20 DIAGNOSIS — R2689 Other abnormalities of gait and mobility: Secondary | ICD-10-CM | POA: Diagnosis not present

## 2024-02-20 DIAGNOSIS — Z7409 Other reduced mobility: Secondary | ICD-10-CM | POA: Diagnosis not present

## 2024-02-25 ENCOUNTER — Other Ambulatory Visit: Payer: Self-pay | Admitting: Family Medicine

## 2024-02-25 DIAGNOSIS — N390 Urinary tract infection, site not specified: Secondary | ICD-10-CM

## 2024-02-25 MED ORDER — NITROFURANTOIN MACROCRYSTAL 100 MG PO CAPS: 100.0000 mg | ORAL_CAPSULE | Freq: Every day | ORAL | 0 refills | Status: DC

## 2024-02-26 ENCOUNTER — Other Ambulatory Visit: Payer: Self-pay | Admitting: Family Medicine

## 2024-02-26 DIAGNOSIS — D649 Anemia, unspecified: Secondary | ICD-10-CM | POA: Diagnosis not present

## 2024-02-27 LAB — CBC
Hematocrit: 32 % — ABNORMAL LOW (ref 34.0–46.6)
Hemoglobin: 10.4 g/dL — ABNORMAL LOW (ref 11.1–15.9)
MCH: 31.9 pg (ref 26.6–33.0)
MCHC: 32.5 g/dL (ref 31.5–35.7)
MCV: 98 fL — ABNORMAL HIGH (ref 79–97)
Platelets: 237 10*3/uL (ref 150–450)
RBC: 3.26 x10E6/uL — ABNORMAL LOW (ref 3.77–5.28)
RDW: 12.5 % (ref 11.7–15.4)
WBC: 7.5 10*3/uL (ref 3.4–10.8)

## 2024-02-27 LAB — B12 AND FOLATE PANEL
Folate: 13.2 ng/mL (ref >3.0)
Vitamin B-12: 1774 pg/mL — ABNORMAL HIGH (ref 232–1245)

## 2024-02-27 LAB — IRON,TIBC AND FERRITIN PANEL
Ferritin: 32 ng/mL (ref 15–150)
Iron Saturation: 20 % (ref 15–55)
Iron: 65 ug/dL (ref 27–139)
Total Iron Binding Capacity: 326 ug/dL (ref 250–450)
UIBC: 261 ug/dL (ref 118–369)

## 2024-03-10 ENCOUNTER — Encounter: Payer: Self-pay | Admitting: Family Medicine

## 2024-03-10 ENCOUNTER — Ambulatory Visit (INDEPENDENT_AMBULATORY_CARE_PROVIDER_SITE_OTHER): Admitting: Family Medicine

## 2024-03-10 VITALS — BP 182/74 | HR 69 | Ht 66.0 in | Wt 145.1 lb

## 2024-03-10 DIAGNOSIS — M19042 Primary osteoarthritis, left hand: Secondary | ICD-10-CM | POA: Diagnosis not present

## 2024-03-10 DIAGNOSIS — I10 Essential (primary) hypertension: Secondary | ICD-10-CM

## 2024-03-10 DIAGNOSIS — F324 Major depressive disorder, single episode, in partial remission: Secondary | ICD-10-CM

## 2024-03-10 DIAGNOSIS — M19041 Primary osteoarthritis, right hand: Secondary | ICD-10-CM

## 2024-03-10 MED ORDER — BLOOD PRESSURE KIT: PACK | 0 refills | Status: AC

## 2024-03-10 MED ORDER — CELECOXIB 100 MG PO CAPS: 100.0000 mg | ORAL_CAPSULE | Freq: Two times a day (BID) | ORAL | 1 refills | Status: AC | PRN

## 2024-03-10 NOTE — Progress Notes (Signed)
   Established Patient Office Visit  Subjective   Patient ID: Amy Kaiser, female    DOB: Nov 18, 1928  Age: 89 y.o. MRN: [id number]  Chief Complaint  Patient presents with   Weight Gain    HPI  Subjective - Fatigue, lack of energy - Decreased appetite, reports not eating as much as she should -Did not tolerate the Remeron .  Caused confusion.  Not taking it. - Reports feeling depressed, has many worries -Feels like she does not have the energy to do the things she wants to do. - Expresses concern with relationship with her daughter lives across the street. - Discussed medication options such as Rexulti or Abilify.  Patient does not want to take any medications that could cause confusion  - Arthritis in hands with visible joint deformities  Medications: metoprolol , furosemide  three times weekly, nitroglycerine PRN, daily antibiotic, B12 supplement, Celebrex  not filled since 2023  PMH, PSH, FH, Social Hx: 89 years old, turning 66 on 03/29/2024, history of anemia, mild chronic kidney disease, prosthetic aortic valve (bovine), pericardial effusion, lives with granddaughter who manages medications, daughter lives across the street but limited contact  ROS: Cardiovascular - episodes of tachycardia, palpitations, irregular heartbeat. Musculoskeletal - arthritis in hands with joint deformities. Psychiatric - reports depression, history of hallucinations. Gastrointestinal - poor appetite.   The ASCVD Risk score (Arnett DK, et al., 2019) failed to calculate for the following reasons:   The 2019 ASCVD risk score is only valid for ages 85 to 83  Health Maintenance Due  Topic Date Due   Zoster Vaccines- Shingrix (1 of 2) 03/29/1950   DEXA SCAN  Never done   COVID-19 Vaccine (3 - Pfizer risk series) 02/24/2020      Objective:     BP (!) 182/74   Pulse 69   Ht 5\' 6"  (1.676 m)   Wt 145 lb 1.9 oz (65.8 kg)   SpO2 99%   BMI 23.42 kg/m    Physical Exam General: Alert, oriented.   Companied by daughter-in-law. CV: No murmurs Pulmonary: Lungs clear bilaterally.   No results found for any visits on 03/10/24.      Assessment & Plan:   Major depressive disorder in partial remission, unspecified whether recurrent [facility]) Assessment & Plan: Patient did not tolerate Remeron .  Continues to have poor appetite, lack of energy.  Declines Abilify or Rexulti.   Primary osteoarthritis of both hands Assessment & Plan: Sending in Celebrex .  Patient was on this in the past but stopped taking it.  Does not look like she was ever told to stop taking it.  100 mg twice daily as needed.  Orders: -     Celecoxib ; Take 1 capsule (100 mg total) by mouth 2 (two) times daily between meals as needed.  Dispense: 60 capsule; Refill: 1  Essential hypertension Assessment & Plan: Elevated today.  Sending in order for a blood pressure kit.  Advised patient to check her blood pressure at home 1-2 times a day for the next few weeks and bring results back to us .   Other orders -     Blood Pressure; Check bp daily  Dispense: 1 kit; Refill: 0     Return in about 4 weeks (around 04/07/2024) for mood, arthritis.    Laneta Pintos, MD

## 2024-03-10 NOTE — Assessment & Plan Note (Signed)
 Sending in Celebrex .  Patient was on this in the past but stopped taking it.  Does not look like she was ever told to stop taking it.  100 mg twice daily as needed.

## 2024-03-10 NOTE — Assessment & Plan Note (Signed)
 Patient did not tolerate Remeron .  Continues to have poor appetite, lack of energy.  Declines Abilify or Rexulti.

## 2024-03-10 NOTE — Patient Instructions (Signed)
 It was nice to see you today,  We addressed the following topics today: -I have sent in a medication called Celebrex  to be used twice a day for your arthritis - The other medication I was discussing that may help your mood or energy is Rexulti.  The other 1 is Abilify.  Both of these are very similar.  Rexulti is approved for patients and dementia but also as a adjunctive treatment for depression. - I will see back in 1 month - Please call your insurance company to see if they cover blood pressure cuff machines. - Record your blood pressure values once a day until you see me again.  Bring the values on to us .  Have a great day,  Etha Henle, MD

## 2024-03-10 NOTE — Assessment & Plan Note (Signed)
 Elevated today.  Sending in order for a blood pressure kit.  Advised patient to check her blood pressure at home 1-2 times a day for the next few weeks and bring results back to us .

## 2024-04-08 ENCOUNTER — Ambulatory Visit (INDEPENDENT_AMBULATORY_CARE_PROVIDER_SITE_OTHER): Admitting: Family Medicine

## 2024-04-08 ENCOUNTER — Encounter: Payer: Self-pay | Admitting: Family Medicine

## 2024-04-08 VITALS — BP 135/66 | HR 70 | Ht 66.0 in | Wt 144.8 lb

## 2024-04-08 DIAGNOSIS — I10 Essential (primary) hypertension: Secondary | ICD-10-CM

## 2024-04-08 DIAGNOSIS — M19041 Primary osteoarthritis, right hand: Secondary | ICD-10-CM

## 2024-04-08 DIAGNOSIS — R630 Anorexia: Secondary | ICD-10-CM | POA: Diagnosis not present

## 2024-04-08 DIAGNOSIS — G4700 Insomnia, unspecified: Secondary | ICD-10-CM | POA: Diagnosis not present

## 2024-04-08 DIAGNOSIS — M19042 Primary osteoarthritis, left hand: Secondary | ICD-10-CM

## 2024-04-08 NOTE — Patient Instructions (Signed)
 It was nice to see you today,  We addressed the following topics today: - Try taking your celebrex  to help with your joint pain.   - try keeping food near your or setting alarm reminders to eat.  Your weight has been stable.   - you seem to be getting enough sleep.  You only need about 7 hours of sleep total each day at your age.   Have a great day,  Etha Henle, MD

## 2024-04-08 NOTE — Assessment & Plan Note (Signed)
 Significant hand arthritis limiting mobility and function, particularly with gripping. Affecting ability to safely use shower.    - Encouraged to try Celebrex  100mg  BID as prescribed    - Can use topical Voltaren gel 2-4 times daily as alternative    - Discussed shower safety, has transfer bench but still concerned about falls

## 2024-04-08 NOTE — Assessment & Plan Note (Signed)
 Home BP readings mostly within normal range with some elevated evening readings. No medication changes needed at this time.    - Continue current blood pressure medication    - Continue home BP monitoring    - Hydralazine  PRN for palpitations

## 2024-04-08 NOTE — Progress Notes (Unsigned)
 Established Patient Office Visit  Subjective   Patient ID: Amy Kaiser, female    DOB: Aug 13, 1929  Age: 89 y.o. MRN: [id number]  Chief Complaint  Patient presents with   Follow-up    Mood / arthritis    HPI  Subjective - Forgetful, reports turning around twice and not knowing what's going on - Stressed, reports history of forgetfulness when stressed - Grieving loss of friends and family members - Not sleeping well: goes to bed in afternoon, gets up for supper, returns to bed to watch TV, wakes around 10pm, falls asleep around midnight, wakes between 4-7am - Not eating enough, reports weight loss instead of desired weight gain - Joint pain from arthritis, particularly in hands with limited finger mobility - Reports heart palpitations when lying down to sleep - Reports history of "blackouts" prior to valve replacement - Reports fear of falling in shower, previous fall between tub and commode  Medications: hydralazine  PRN for palpitations, Celebrex  100mg  BID prescribed but not taking, blood pressure medication  PMH: hypertension, arthritis, prosthetic aortic valve replacement, memory issues  ROS: Cardiovascular - palpitations when lying down; Musculoskeletal - joint pain, limited finger mobility; Neurological - memory issues, history of "blackouts" prior to valve replacement   The ASCVD Risk score (Arnett DK, et al., 2019) failed to calculate for the following reasons:   The 2019 ASCVD risk score is only valid for ages 74 to 59  Health Maintenance Due  Topic Date Due   Zoster Vaccines- Shingrix (1 of 2) 03/29/1950   DEXA SCAN  Never done   COVID-19 Vaccine (3 - Pfizer risk series) 02/24/2020      Objective:     BP (!) 152/81   Pulse 70   Ht 5\' 6"  (1.676 m)   Wt 144 lb 12 oz (65.7 kg)   SpO2 97%   BMI 23.36 kg/m  {Vitals History (Optional):23777}  Physical Exam Gen: alert, oriented Cv: rrr.  2/6 systolic murmur.  No carotid bruits.  Pulm: lctab  anteriorly Psych: pleasant.  Repeats herself occasionally   No results found for any visits on 04/08/24.      Assessment & Plan:   Essential hypertension Assessment & Plan:    Home BP readings mostly within normal range with some elevated evening readings. No medication changes needed at this time.    - Continue current blood pressure medication    - Continue home BP monitoring    - Hydralazine  PRN for palpitations   Arthritis of both hands Assessment & Plan:    Significant hand arthritis limiting mobility and function, particularly with gripping. Affecting ability to safely use shower.    - Encouraged to try Celebrex  100mg  BID as prescribed    - Can use topical Voltaren gel 2-4 times daily as alternative    - Discussed shower safety, has transfer bench but still concerned about falls   Insomnia, unspecified type Assessment & Plan:    Multiple sleep periods throughout 24-hour cycle, but likely getting adequate total sleep time.    - Discussed normal age-related changes in sleep patterns    - No medication changes at this time   Poor appetite Assessment & Plan:    Weight stable at 145 lbs over past 3 months despite reports of not eating enough.    - Suggested setting reminders for meals    - Keeping food accessible where spending time    - Will monitor weight at future visits      Return in about  4 months (around 08/08/2024) for weight, pain.    Laneta Pintos, MD

## 2024-04-08 NOTE — Assessment & Plan Note (Signed)
 Weight stable at 145 lbs over past 3 months despite reports of not eating enough.    - Suggested setting reminders for meals    - Keeping food accessible where spending time    - Will monitor weight at future visits

## 2024-04-08 NOTE — Assessment & Plan Note (Signed)
 Multiple sleep periods throughout 24-hour cycle, but likely getting adequate total sleep time.    - Discussed normal age-related changes in sleep patterns    - No medication changes at this time

## 2024-04-24 DIAGNOSIS — K080 Exfoliation of teeth due to systemic causes: Secondary | ICD-10-CM | POA: Diagnosis not present

## 2024-04-27 ENCOUNTER — Telehealth: Payer: Self-pay | Admitting: Cardiovascular Disease

## 2024-04-27 NOTE — Telephone Encounter (Signed)
 Attempted to call patient, and no voicemail was set up. Will try again later.

## 2024-04-27 NOTE — Telephone Encounter (Signed)
   Pre-operative Risk Assessment    Patient Name: Amy Kaiser  DOB: Aug 13, 1929 MRN: [id number]   Date of last office visit: 10/22/23 Date of next office visit: n/a  Request for Surgical Clearance    Procedure:  Dental Extraction - Amount of Teeth to be Pulled:  8  extractions 8 bilateral 1 alveoloplasties  Date of Surgery:  Clearance TBD                                Surgeon:  Gerard Poling Surgeon's Group or Practice Name:  [facility] Phone number:  [phone] Fax number:  [phone]   Type of Clearance Requested:   - Medical    Type of Anesthesia:  Not Indicated   Additional requests/questions:    Signed, Gwendolyn H Slade   04/27/2024, 12:58 PM

## 2024-04-27 NOTE — Telephone Encounter (Signed)
   Name: Amy Kaiser  DOB: Sep 10, 1929  MRN: [id number]  Primary Cardiologist: None   Preoperative team, please contact this patient and set up a phone call appointment for further preoperative risk assessment. Please obtain consent and complete medication review. Thank you for your help.  I confirm that guidance regarding antiplatelet and oral anticoagulation therapy has been completed and, if necessary, noted below.  None requested  I also confirmed the patient resides in the state of [place] . As per [facility] Medical Board telemedicine laws, the patient must reside in the state in which the provider is licensed.   [name], NP 04/27/2024, 2:01 PM [facility]

## 2024-04-28 ENCOUNTER — Telehealth: Payer: Self-pay | Admitting: *Deleted

## 2024-04-28 NOTE — Telephone Encounter (Signed)
 DPR ok to s/w the pt's granddaughter Amy Kaiser who scheduled tele preop appt for the pt. 04/30/24 tele preop appt. Med rec and consent are done.  Pt has dental appt 05/05/24      Patient Consent for Virtual Visit        Amy Kaiser has provided verbal consent on 04/28/2024 for a virtual visit (video or telephone).   CONSENT FOR VIRTUAL VISIT FOR:  Amy Kaiser  By participating in this virtual visit I agree to the following:  I hereby voluntarily request, consent and authorize [facility] and its employed or contracted physicians, physician assistants, nurse practitioners or other licensed health care professionals (the Practitioner), to provide me with telemedicine health care services (the "Services) as deemed necessary by the treating Practitioner. I acknowledge and consent to receive the Services by the Practitioner via telemedicine. I understand that the telemedicine visit will involve communicating with the Practitioner through live audiovisual communication technology and the disclosure of certain medical information by electronic transmission. I acknowledge that I have been given the opportunity to request an in-person assessment or other available alternative prior to the telemedicine visit and am voluntarily participating in the telemedicine visit.  I understand that I have the right to withhold or withdraw my consent to the use of telemedicine in the course of my care at any time, without affecting my right to future care or treatment, and that the Practitioner or I may terminate the telemedicine visit at any time. I understand that I have the right to inspect all information obtained and/or recorded in the course of the telemedicine visit and may receive copies of available information for a reasonable fee.  I understand that some of the potential risks of receiving the Services via telemedicine include:  Delay or interruption in medical evaluation due to technological  equipment failure or disruption; Information transmitted may not be sufficient (e.g. poor resolution of images) to allow for appropriate medical decision making by the Practitioner; and/or  In rare instances, security protocols could fail, causing a breach of personal health information.  Furthermore, I acknowledge that it is my responsibility to provide information about my medical history, conditions and care that is complete and accurate to the best of my ability. I acknowledge that Practitioner's advice, recommendations, and/or decision may be based on factors not within their control, such as incomplete or inaccurate data provided by me or distortions of diagnostic images or specimens that may result from electronic transmissions. I understand that the practice of medicine is not an exact science and that Practitioner makes no warranties or guarantees regarding treatment outcomes. I acknowledge that a copy of this consent can be made available to me via my patient portal [facility] MyChart), or I can request a printed copy by calling the office of [facility].    I understand that my insurance will be billed for this visit.   I have read or had this consent read to me. I understand the contents of this consent, which adequately explains the benefits and risks of the Services being provided via telemedicine.  I have been provided ample opportunity to ask questions regarding this consent and the Services and have had my questions answered to my satisfaction. I give my informed consent for the services to be provided through the use of telemedicine in my medical care

## 2024-04-28 NOTE — Telephone Encounter (Signed)
 DPR ok to s/w the pt's granddaughter Duwaine who scheduled tele preop appt for the pt. 04/30/24 tele preop appt. Med rec and consent are done.  Pt has dental appt 05/05/24

## 2024-04-29 DIAGNOSIS — F325 Major depressive disorder, single episode, in full remission: Secondary | ICD-10-CM | POA: Insufficient documentation

## 2024-04-29 DIAGNOSIS — I700 Atherosclerosis of aorta: Secondary | ICD-10-CM | POA: Insufficient documentation

## 2024-04-29 DIAGNOSIS — G9340 Encephalopathy, unspecified: Secondary | ICD-10-CM | POA: Insufficient documentation

## 2024-04-29 DIAGNOSIS — E441 Mild protein-calorie malnutrition: Secondary | ICD-10-CM | POA: Insufficient documentation

## 2024-04-29 DIAGNOSIS — M51379 Other intervertebral disc degeneration, lumbosacral region without mention of lumbar back pain or lower extremity pain: Secondary | ICD-10-CM | POA: Insufficient documentation

## 2024-04-29 DIAGNOSIS — R9389 Abnormal findings on diagnostic imaging of other specified body structures: Secondary | ICD-10-CM | POA: Insufficient documentation

## 2024-04-29 NOTE — Telephone Encounter (Signed)
 Recommend doxycycline 100mg  x 1 tablet 30-60 mins before procedure

## 2024-04-30 ENCOUNTER — Ambulatory Visit: Attending: Cardiovascular Disease

## 2024-04-30 DIAGNOSIS — Z01810 Encounter for preprocedural cardiovascular examination: Secondary | ICD-10-CM

## 2024-04-30 MED ORDER — DOXYCYCLINE HYCLATE 100 MG PO CAPS: 100.0000 mg | ORAL_CAPSULE | Freq: Once | ORAL | 0 refills | Status: AC

## 2024-04-30 NOTE — Progress Notes (Signed)
 Virtual Visit via Telephone Note   Because of Amy Kaiser co-morbid illnesses, she is at least at moderate risk for complications without adequate follow up.  This format is felt to be most appropriate for this patient at this time.  Due to technical limitations with video connection (technology), today's appointment will be conducted as an audio only telehealth visit, and Amy Kaiser verbally agreed to proceed in this manner.   All issues noted in this document were discussed and addressed.  No physical exam could be performed with this format.  Evaluation Performed:  Preoperative cardiovascular risk assessment _____________   Date:  04/30/2024   Patient ID:  Amy Kaiser, DOB May 25, 1929, MRN [id number] Patient Location:  Home Provider location:   Office  Primary Care Provider:  Chandra Toribio POUR, MD Primary Cardiologist:  None  Chief Complaint / Patient Profile  89 y.o. y/o female with a h/o severe aortic stenosis s/p TAVR in 2018, CAD with severe stenosis of the proximal RCA s/p DES in 2018, chronic diastolic heart failure, hyperlipidemia, hypertension who is pending dental extraction of 8 teeth and 1 alveoloplasty and presents today for telephonic preoperative cardiovascular risk assessment. History of Present Illness  Amy Kaiser is a 89 y.o. female who presents via audio/video conferencing for a telehealth visit today.  Pt was last seen in cardiology clinic on 10/22/2023 by Dr. [name].  At that time Amy Kaiser was doing well.  The patient is now pending procedure as outlined above. Since her last visit, she has remained stable from a cardiac standpoint.  Patient is able to complete her ADLs, does chores in her house and walks out in her yard daily, she reports she tolerates this very well. Today she denies chest pain, shortness of breath, lower extremity edema, fatigue, palpitations, melena, hematuria, hemoptysis, diaphoresis, weakness, presyncope, syncope,  orthopnea, and PND.  Past Medical History    Past Medical History:  Diagnosis Date   Anemia    Anxiety    Arthritis    just about all over (03/11/2017)   Asthma    Cervical cancer (HCC)    Chronic UTI    COPD (chronic obstructive pulmonary disease) (HCC)    Coronary artery disease    Depression    Heart murmur    Hip fracture (HCC)    left hip   Hyperlipidemia    Low blood sugar    Migraine    work related; quit when I quit work in ~ 1997 (03/11/2017)   PONV (postoperative nausea and vomiting)    Skin cancer    back   Past Surgical History:  Procedure Laterality Date   APPENDECTOMY     CARDIAC CATHETERIZATION  02/2017   CATARACT EXTRACTION W/ INTRAOCULAR LENS  IMPLANT, BILATERAL Bilateral    CORONARY ANGIOPLASTY WITH STENT PLACEMENT  03/11/2017   CORONARY STENT INTERVENTION N/A 03/11/2017   Procedure: Coronary Stent Intervention;  Surgeon: Wonda Sharper, MD;  Location: [facility] INVASIVE CV LAB;  Service: Cardiovascular;  Laterality: N/A;   IR RADIOLOGY PERIPHERAL GUIDED IV START  02/26/2017   IR US  GUIDE VASC ACCESS RIGHT  02/26/2017   KNEE ARTHROSCOPY Right 03/09/2021   Procedure: RIGHT KNEE ARTHROSCOPY WITH PARTIAL MEDIAL AND LATERAL MENISCECTOMIES;  Surgeon: Vernetta Lonni GRADE, MD;  Location: [facility];  Service: Orthopedics;  Laterality: Right;   RIGHT/LEFT HEART CATH AND CORONARY ANGIOGRAPHY N/A 02/20/2017   Procedure: Right/Left Heart Cath and Coronary Angiography;  Surgeon: Sharper Wonda, MD;  Location: [facility]  INVASIVE CV LAB;  Service: Cardiovascular;  Laterality: N/A;   SKIN CANCER EXCISION     back   TEE WITHOUT CARDIOVERSION N/A 04/09/2017   Procedure: TRANSESOPHAGEAL ECHOCARDIOGRAM (TEE);  Surgeon: Wonda Sharper, MD;  Location: [facility] OR;  Service: Open Heart Surgery;  Laterality: N/A;   TRANSCATHETER AORTIC VALVE REPLACEMENT, TRANSFEMORAL N/A 04/09/2017   Procedure: TRANSCATHETER AORTIC VALVE REPLACEMENT, TRANSFEMORAL;  Surgeon: Wonda Sharper, MD;   Location: [facility] OR;  Service: Open Heart Surgery;  Laterality: N/A;   VAGINAL HYSTERECTOMY      Allergies  Allergies  Allergen Reactions   Latex Hives, Itching and Other (See Comments)    Caused blisters in her mouth   Lisinopril Other (See Comments)    Weakness    Penicillins Other (See Comments)    Intolerance to ALL CILLINS > UNSPECIFIED REACTIONS  Has patient had a PCN reaction causing immediate rash, facial/tongue/throat swelling, SOB or lightheadedness with hypotension: UNSPECIFIED REACTION   Has patient had a PCN reaction causing severe rash involving mucus membranes or skin necrosis: UNSPECIFIED REACTION   Has patient had a PCN reaction that required hospitalization UNSPECIFIED REACTION   Has patient had a PCN reaction occurring within the last 10 years: UNSPECIFIED REACTION  Doesn't recall  Intolerance to ALL CILLINS > UNSPECIFIED REACTIONS    Has patient had a PCN reaction causing immediate rash, facial/tongue/throat swelling, SOB or lightheadedness with hypotension: UNSPECIFIED REACTION   Has patient had a PCN reaction causing severe rash involving mucus membranes or skin necrosis: UNSPECIFIED REACTION   Has patient had a PCN reaction that required hospitalization UNSPECIFIED REACTION   Has patient had a PCN reaction occurring within the last 10 years: UNSPECIFIED REACTION   Pravachol [Pravastatin Sodium] Other (See Comments)    MYALGIAs LEG PAIN    Pravastatin Other (See Comments)    Other reaction(s): leg pain  MYALGIAs  LEG PAIN   Alendronate Sodium Other (See Comments)    UNSPECIFIED REACTION  [Patient denies this allergy]    Amoxicillin Other (See Comments)    Doesn't recall   Other Other (See Comments)    Darvocet - unknown   Propoxyphene Other (See Comments)   Rosuvastatin  Other (See Comments)    High doses - Myalgia   Sulfamethoxazole-Trimethoprim Other (See Comments)    Pt denies    Budesonide -Formoterol Fumarate Anxiety and Other (See  Comments)    SHAKY   Bupropion Anxiety and Other (See Comments)    JITTERY   Codeine Nausea And Vomiting and Other (See Comments)   Fluoxetine Anxiety and Other (See Comments)    Other reaction(s): shaky  FEELS SHAKY   Morphine  Nausea And Vomiting   Morphine  And Codeine Nausea And Vomiting   Prozac [Fluoxetine Hcl] Anxiety    FEELS SHAKY     Home Medications    Prior to Admission medications   Medication Sig Start Date End Date Taking? Authorizing Provider  albuterol  (VENTOLIN  HFA) 108 (90 Base) MCG/ACT inhaler Inhale 2 puffs into the lungs every 6 (six) hours as needed for wheezing or shortness of breath. 02/07/23   Boscia, Heather E, NP  aspirin  81 MG EC tablet Take 162 mg by mouth daily. Swallow whole.    [provider]  Blood Pressure KIT Check bp daily 03/10/24   Amy Toribio POUR, MD  celecoxib  (CELEBREX ) 100 MG capsule Take 1 capsule (100 mg total) by mouth 2 (two) times daily between meals as needed. 03/10/24   Amy Toribio POUR, MD  Cholecalciferol  (VITAMIN D3) 1000 units  CAPS Take 1,000 Units by mouth daily with lunch.     [provider]  furosemide  (LASIX ) 20 MG tablet TAKE 1 TABLET BY MOUTH ON MONDAY, WEDNESDAY AND FRIDAY 01/27/24   [name], Timothy J, MD  hydrALAZINE  (APRESOLINE ) 25 MG tablet Take 1 tablet (25 mg total) by mouth daily as needed. For systolic blood pressure greater then 150. If blood pressure remains elevated after two hours may take a second hydralazine . 05/07/23 10/22/23  [name], Timothy J, MD  metoprolol  tartrate (LOPRESSOR ) 25 MG tablet Take 1/2 (one-half) tablet by mouth twice daily 11/12/23   [name], Timothy J, MD  Multiple Vitamins-Minerals (ICAPS AREDS 2 PO) Take 1 capsule by mouth 2 (two) times daily.    [provider]  nitrofurantoin  (MACRODANTIN ) 100 MG capsule Take 1 capsule (100 mg total) by mouth daily. 02/25/24   Amy Toribio POUR, MD  nitroGLYCERIN  (NITROSTAT ) 0.4 MG SL tablet DISSOLVE ONE TABLET UNDER THE TONGUE EVERY 5  MINUTES AS NEEDED FOR CHEST PAIN.  DO NOT EXCEED A TOTAL OF 3 DOSES IN 15 MINUTES 10/15/22   [name], Timothy J, MD  rosuvastatin  (CRESTOR ) 20 MG tablet Take 1 tablet (20 mg total) by mouth at bedtime. 01/28/24   Amy Toribio POUR, MD  vitamin B-12 (CYANOCOBALAMIN ) 500 MCG tablet Take 1 tablet (500 mcg total) by mouth daily. 02/02/19   Davia Nydia POUR, MD    Physical Exam  Vital Signs:  Amy Kaiser does not have vital signs available for review today. Given telephonic nature of communication, physical exam is limited. AAOx3. NAD. Normal affect.  Speech and respirations are unlabored. Accessory Clinical Findings    None  Assessment & Plan    1.  Preoperative Cardiovascular Risk Assessment: Ms. Giaimo perioperative risk of a major cardiac event is 11% according to the Revised Cardiac Risk Index (RCRI).  Her functional capacity is fair at 4.4 METs according to the Duke Activity Status Index (DASI). Recommendations: According to ACC/AHA guidelines, no further cardiovascular testing needed.  The patient may proceed to surgery at acceptable risk.   Antiplatelet and/or Anticoagulation Recommendations: None requested.   Please note patient has history of TAVR, she will require SBE prophylaxis prior to dental work.  A prescription for doxycycline has been sent to requested pharmacy.  The patient was advised that if she develops new symptoms prior to surgery to contact our office to arrange for a follow-up visit, and she verbalized understanding.  A copy of this note will be routed to requesting surgeon.  Time:   Today, I have spent 12 minutes with the patient with telehealth technology discussing medical history, symptoms, and management plan.    Zaide Kardell D Joah Patlan, NP  04/30/2024, 4:49 PM

## 2024-05-11 ENCOUNTER — Telehealth: Payer: Self-pay | Admitting: *Deleted

## 2024-05-11 NOTE — Telephone Encounter (Signed)
 LVM informing janice that we received this message and would route to provider for FYI so he can look out for it also let her know that I did not think there would be any charge for this form completion.

## 2024-05-11 NOTE — Telephone Encounter (Signed)
 Copied from CRM [id number]. Topic: Medical Record Request - Records Request >> May 11, 2024  2:47 PM Ivette P wrote: Reason for CRM: Pt daughter called in [employer], about getting medical release form to have dental work done.   Will pass by office to drop forms off   Romero is requesting a follow up regarding medication and forms before surgery. [phone]

## 2024-05-12 NOTE — Telephone Encounter (Signed)
 Daughter brought form in from [employer], stating same information as the original request. Fax number is the same.

## 2024-05-14 NOTE — Telephone Encounter (Signed)
 This has been filled out and ready for them to pick up.

## 2024-06-04 DIAGNOSIS — K080 Exfoliation of teeth due to systemic causes: Secondary | ICD-10-CM | POA: Diagnosis not present

## 2024-06-08 ENCOUNTER — Other Ambulatory Visit: Payer: Self-pay | Admitting: Family Medicine

## 2024-06-08 DIAGNOSIS — N390 Urinary tract infection, site not specified: Secondary | ICD-10-CM

## 2024-06-25 ENCOUNTER — Ambulatory Visit: Payer: Medicare Other

## 2024-06-25 DIAGNOSIS — Z0000 Encounter for general adult medical examination without abnormal findings: Secondary | ICD-10-CM | POA: Diagnosis not present

## 2024-06-25 DIAGNOSIS — K080 Exfoliation of teeth due to systemic causes: Secondary | ICD-10-CM | POA: Diagnosis not present

## 2024-06-25 NOTE — Progress Notes (Signed)
 Subjective:   Amy Kaiser is a 89 y.o. who presents for a Medicare Wellness preventive visit.  As a reminder, Annual Wellness Visits don't include a physical exam, and some assessments may be limited, especially if this visit is performed virtually. We may recommend an in-person follow-up visit with your provider if needed.  Visit Complete: Virtual I connected with  Amy Kaiser on 06/25/24 by a audio enabled telemedicine application and verified that I am speaking with the correct person using two identifiers.  Patient Location: Home  Provider Location: Home Office  I discussed the limitations of evaluation and management by telemedicine. The patient expressed understanding and agreed to proceed.  Vital Signs: Because this visit was a virtual/telehealth visit, some criteria may be missing or patient reported. Any vitals not documented were not able to be obtained and vitals that have been documented are patient reported.  VideoError- Librarian, academic were attempted between this provider and patient, however failed, due to patient having technical difficulties OR patient did not have access to video capability.  We continued and completed visit with audio only.   Persons Participating in Visit: Patient assisted by granddaughter.  AWV Questionnaire: No: Patient Medicare AWV questionnaire was not completed prior to this visit.  Cardiac Risk Factors include: advanced age (>25men, >19 women);hypertension     Objective:    Today's Vitals   There is no height or weight on file to calculate BMI.     06/25/2024   10:15 AM 06/11/2023   10:22 AM 11/22/2022    9:59 AM 11/22/2021    9:30 AM 11/13/2021    1:36 PM 05/17/2021    2:51 PM 03/09/2021    6:50 AM  Advanced Directives  Does Patient Have a Medical Advance Directive? Yes Yes Yes Yes No Yes Yes  Type of Estate agent of [place];Living will Healthcare Power of [place];Living  will Healthcare Power of [employer] of [place];Living will  Copy of Healthcare Power of Attorney in Chart? No - copy requested No - copy requested         Current Medications (verified) Outpatient Encounter Medications as of 06/25/2024  Medication Sig   albuterol  (VENTOLIN  HFA) 108 (90 Base) MCG/ACT inhaler Inhale 2 puffs into the lungs every 6 (six) hours as needed for wheezing or shortness of breath.   aspirin  81 MG EC tablet Take 162 mg by mouth daily. Swallow whole.   Blood Pressure KIT Check bp daily   celecoxib  (CELEBREX ) 100 MG capsule Take 1 capsule (100 mg total) by mouth 2 (two) times daily between meals as needed.   Cholecalciferol  (VITAMIN D3) 1000 units CAPS Take 1,000 Units by mouth daily with lunch.    furosemide  (LASIX ) 20 MG tablet TAKE 1 TABLET BY MOUTH ON MONDAY, WEDNESDAY AND FRIDAY   hydrALAZINE  (APRESOLINE ) 25 MG tablet Take 1 tablet (25 mg total) by mouth daily as needed. For systolic blood pressure greater then 150. If blood pressure remains elevated after two hours may take a second hydralazine .   metoprolol  tartrate (LOPRESSOR ) 25 MG tablet Take 1/2 (one-half) tablet by mouth twice daily   Multiple Vitamins-Minerals (ICAPS AREDS 2 PO) Take 1 capsule by mouth 2 (two) times daily.   nitrofurantoin  (MACRODANTIN ) 100 MG capsule Take 1 capsule by mouth once daily   nitroGLYCERIN  (NITROSTAT ) 0.4 MG SL tablet DISSOLVE ONE TABLET UNDER THE TONGUE EVERY 5 MINUTES AS NEEDED FOR CHEST PAIN.  DO NOT EXCEED A TOTAL OF  3 DOSES IN 15 MINUTES   rosuvastatin  (CRESTOR ) 20 MG tablet TAKE 1 TABLET BY MOUTH AT BEDTIME   vitamin B-12 (CYANOCOBALAMIN ) 500 MCG tablet Take 1 tablet (500 mcg total) by mouth daily.   No facility-administered encounter medications on file as of 06/25/2024.    Allergies (verified) Latex, Lisinopril, Penicillins, Pravachol [pravastatin sodium], Pravastatin, Alendronate sodium, Amoxicillin, Other, Propoxyphene, Rosuvastatin ,  Sulfamethoxazole-trimethoprim, Budesonide -formoterol fumarate, Bupropion, Codeine, Fluoxetine, Morphine , Morphine  and codeine, and Prozac [fluoxetine hcl]   History: Past Medical History:  Diagnosis Date   Anemia    Anxiety    Arthritis    just about all over (03/11/2017)   Asthma    Cervical cancer (HCC)    Chronic UTI    COPD (chronic obstructive pulmonary disease) (HCC)    Coronary artery disease    Depression    Heart murmur    Hip fracture (HCC)    left hip   Hyperlipidemia    Low blood sugar    Migraine    work related; quit when I quit work in ~ 1997 (03/11/2017)   PONV (postoperative nausea and vomiting)    Skin cancer    back   Past Surgical History:  Procedure Laterality Date   APPENDECTOMY     CARDIAC CATHETERIZATION  02/2017   CATARACT EXTRACTION W/ INTRAOCULAR LENS  IMPLANT, BILATERAL Bilateral    CORONARY ANGIOPLASTY WITH STENT PLACEMENT  03/11/2017   CORONARY STENT INTERVENTION N/A 03/11/2017   Procedure: Coronary Stent Intervention;  Surgeon: Wonda Sharper, MD;  Location: [facility] INVASIVE CV LAB;  Service: Cardiovascular;  Laterality: N/A;   IR RADIOLOGY PERIPHERAL GUIDED IV START  02/26/2017   IR US  GUIDE VASC ACCESS RIGHT  02/26/2017   KNEE ARTHROSCOPY Right 03/09/2021   Procedure: RIGHT KNEE ARTHROSCOPY WITH PARTIAL MEDIAL AND LATERAL MENISCECTOMIES;  Surgeon: Vernetta Lonni GRADE, MD;  Location: [facility];  Service: Orthopedics;  Laterality: Right;   MULTIPLE TOOTH EXTRACTIONS     06/2024   RIGHT/LEFT HEART CATH AND CORONARY ANGIOGRAPHY N/A 02/20/2017   Procedure: Right/Left Heart Cath and Coronary Angiography;  Surgeon: Sharper Wonda, MD;  Location: [facility] INVASIVE CV LAB;  Service: Cardiovascular;  Laterality: N/A;   SKIN CANCER EXCISION     back   TEE WITHOUT CARDIOVERSION N/A 04/09/2017   Procedure: TRANSESOPHAGEAL ECHOCARDIOGRAM (TEE);  Surgeon: Wonda Sharper, MD;  Location: [facility] OR;  Service: Open Heart Surgery;  Laterality: N/A;    TRANSCATHETER AORTIC VALVE REPLACEMENT, TRANSFEMORAL N/A 04/09/2017   Procedure: TRANSCATHETER AORTIC VALVE REPLACEMENT, TRANSFEMORAL;  Surgeon: Wonda Sharper, MD;  Location: [facility] OR;  Service: Open Heart Surgery;  Laterality: N/A;   VAGINAL HYSTERECTOMY     History reviewed. No pertinent family history. Social History   Socioeconomic History   Marital status: Widowed    Spouse name: Not on file   Number of children: 3   Years of education: Not on file   Highest education level: 9th grade  Occupational History   Not on file  Tobacco Use   Smoking status: Never   Smokeless tobacco: Never  Vaping Use   Vaping status: Never Used  Substance and Sexual Activity   Alcohol use: No   Drug use: No   Sexual activity: Not Currently  Other Topics Concern   Not on file  Social History Narrative   Right handed    Lives with grand daughter       Social Drivers of Health   Financial Resource Strain: Low Risk  (06/25/2024)   Overall Financial  Resource Strain (CARDIA)    Difficulty of Paying Living Expenses: Not hard at all  Food Insecurity: No Food Insecurity (06/25/2024)   Hunger Vital Sign    Worried About Running Out of Food in the Last Year: Never true    Ran Out of Food in the Last Year: Never true  Transportation Needs: No Transportation Needs (06/25/2024)   PRAPARE - Administrator, Civil Service (Medical): No    Lack of Transportation (Non-Medical): No  Physical Activity: Insufficiently Active (06/25/2024)   Exercise Vital Sign    Days of Exercise per Week: 7 days    Minutes of Exercise per Session: 20 min  Stress: Stress Concern Present (06/25/2024)   [employer] of Occupational Health - Occupational Stress Questionnaire    Feeling of Stress: To some extent  Social Connections: Socially Isolated (06/25/2024)   Social Connection and Isolation Panel    Frequency of Communication with Friends and Family: More than three times a week    Frequency of Social  Gatherings with Friends and Family: Twice a week    Attends Religious Services: Never    Database administrator or Organizations: No    Attends Banker Meetings: Never    Marital Status: Widowed    Tobacco Counseling Counseling given: Not Answered    Clinical Intake:  Pre-visit preparation completed: Yes  Pain : No/denies pain     Nutritional Risks: None Diabetes: No  Lab Results  Component Value Date   HGBA1C 5.9 (H) 08/21/2023   HGBA1C 6.1 (H) 07/25/2022   HGBA1C 5.7 (H) 04/05/2017     How often do you need to have someone help you when you read instructions, pamphlets, or other written materials from your doctor or pharmacy?: 4 - Often  Interpreter Needed?: No  Information entered by :: NAllen LPN   Activities of Daily Living     06/25/2024   10:08 AM  In your present state of health, do you have any difficulty performing the following activities:  Hearing? 1  Comment a little  Vision? 0  Difficulty concentrating or making decisions? 1  Walking or climbing stairs? 1  Dressing or bathing? 1  Doing errands, shopping? 1  Preparing Food and eating ? Y  Using the Toilet? N  In the past six months, have you accidently leaked urine? Y  Do you have problems with loss of bowel control? N  Managing your Medications? Y  Managing your Finances? Y  Housekeeping or managing your Housekeeping? Y    Patient Care Team: Chandra Toribio POUR, MD as PCP - General (Family Medicine) Teresa Channel, MD (Family Medicine) Georjean Darice HERO, MD as Consulting Physician (Neurology) Perla Evalene PARAS, MD as Consulting Physician (Cardiology)  I have updated your Care Teams any recent Medical Services you may have received from other providers in the past year.     Assessment:   This is a routine wellness examination for Jany.  Hearing/Vision screen Hearing Screening - Comments:: Denies real hearing issues Vision Screening - Comments:: No Regular eye exams,     Goals Addressed             This Visit's Progress    Patient Stated       06/25/2024, get teeth out and get through it       Depression Screen     06/25/2024   10:18 AM 04/08/2024    2:02 PM 03/10/2024    9:12 AM 01/29/2024   10:47 AM 06/11/2023  10:14 AM 02/07/2023   11:08 AM 08/30/2022   11:48 AM  PHQ 2/9 Scores  PHQ - 2 Score 1 1 3 5  0 2 1  PHQ- 9 Score  9 11 14  11      Fall Risk     06/25/2024   10:17 AM 03/10/2024    9:12 AM 01/29/2024   10:47 AM 06/11/2023   10:20 AM 06/10/2023    1:31 PM  Fall Risk   Falls in the past year? 0 1 1 0 0  Number falls in past yr: 0 0 0 0   Injury with Fall? 0 0 0 0   Risk for fall due to : Impaired mobility;Impaired balance/gait;Medication side effect Other (Comment) Other (Comment) No Fall Risks   Follow up Falls prevention discussed;Falls evaluation completed Falls evaluation completed Falls evaluation completed Falls prevention discussed     MEDICARE RISK AT HOME:  Medicare Risk at Home Any stairs in or around the home?: Yes If so, are there any without handrails?: No Home free of loose throw rugs in walkways, pet beds, electrical cords, etc?: Yes Adequate lighting in your home to reduce risk of falls?: Yes Life alert?: No Use of a cane, walker or w/c?: Yes Grab bars in the bathroom?: Yes Shower chair or bench in shower?: No Elevated toilet seat or a handicapped toilet?: Yes  TIMED UP AND GO:  Was the test performed?  No  Cognitive Function: Impaired: Patient has current diagnosis of cognitive impairment.      11/22/2022   10:00 AM 11/22/2021   12:00 PM  MMSE - Mini Mental State Exam  Orientation to time 4 5  Orientation to Place 4 5  Registration 3 3  Attention/ Calculation 5 2  Recall 1 2  Language- name 2 objects 2 2  Language- repeat 1 1  Language- follow 3 step command 3 3  Language- read & follow direction 1 1  Write a sentence 1 1  Copy design 1 1  Total score 26 26      09/09/2020    9:00 AM  [place]  Cognitive Assessment   Visuospatial/ Executive (0/5) 4  Naming (0/3) 2  Attention: Read list of digits (0/2) 1  Attention: Read list of letters (0/1) 0  Attention: Serial 7 subtraction starting at 100 (0/3) 0  Language: Repeat phrase (0/2) 2  Language : Fluency (0/1) 1  Abstraction (0/2) 2  Delayed Recall (0/5) 0  Orientation (0/6) 6  Total 18  Adjusted Score (based on education) 19      06/11/2023   10:23 AM  6CIT Screen  What Year? 0 points  What month? 0 points  What time? 0 points  Count back from 20 0 points  Months in reverse 4 points  Repeat phrase 0 points  Total Score 4 points    Immunizations Immunization History  Administered Date(s) Administered   Fluad Quad(high Dose 65+) 10/18/2020, 10/02/2022   Fluad Trivalent(High Dose 65+) 07/24/2023   Fluzone Influenza virus vaccine,trivalent (IIV3), split virus 08/24/2010   Influenza Split 08/06/2008   Influenza,inj,Quad PF,6+ Mos 10/13/2019   Influenza,inj,quad, With Preservative 08/03/2011, 07/23/2013, 08/01/2015, 08/30/2016, 09/03/2017, 09/05/2018   Influenza-Unspecified 09/11/2012, 08/19/2014   PFIZER(Purple Top)SARS-COV-2 Vaccination 12/31/2019, 01/27/2020   Pneumococcal Conjugate-13 09/24/2016   Pneumococcal Polysaccharide-23 10/06/2007   Tdap 04/03/2012, 11/25/2021   Zoster, Live 04/03/2012    Screening Tests Health Maintenance  Topic Date Due   Zoster Vaccines- Shingrix (1 of 2) 03/29/1948   DEXA SCAN  Never  done   COVID-19 Vaccine (3 - Pfizer risk series) 02/24/2020   INFLUENZA VACCINE  06/05/2024   Medicare Annual Wellness (AWV)  06/25/2025   DTaP/Tdap/Td (3 - Td or Tdap) 11/26/2031   Pneumococcal Vaccine: 50+ Years  Completed   HPV VACCINES  Aged Out   Meningococcal B Vaccine  Aged Out    Health Maintenance  Health Maintenance Due  Topic Date Due   Zoster Vaccines- Shingrix (1 of 2) 03/29/1948   DEXA SCAN  Never done   COVID-19 Vaccine (3 - Pfizer risk series) 02/24/2020   INFLUENZA VACCINE   06/05/2024   Health Maintenance Items Addressed: Due for shingles, covid and flu vaccine. DEXA declined at this time.  Additional Screening:  Vision Screening: Recommended annual ophthalmology exams for early detection of glaucoma and other disorders of the eye. Would you like a referral to an eye doctor? No    Dental Screening: Recommended annual dental exams for proper oral hygiene  Community Resource Referral / Chronic Care Management: CRR required this visit?  No   CCM required this visit?  No   Plan:    I have personally reviewed and noted the following in the patient's chart:   Medical and social history Use of alcohol, tobacco or illicit drugs  Current medications and supplements including opioid prescriptions. Patient is not currently taking opioid prescriptions. Functional ability and status Nutritional status Physical activity Advanced directives List of other physicians Hospitalizations, surgeries, and ER visits in previous 12 months Vitals Screenings to include cognitive, depression, and falls Referrals and appointments  In addition, I have reviewed and discussed with patient certain preventive protocols, quality metrics, and best practice recommendations. A written personalized care plan for preventive services as well as general preventive health recommendations were provided to patient.   Ardella FORBES Dawn, LPN   1/[id number]/7974   After Visit Summary: (MyChart) Due to this being a telephonic visit, the after visit summary with patients personalized plan was offered to patient via MyChart   Notes: Nothing significant to report at this time.

## 2024-06-25 NOTE — Patient Instructions (Signed)
 Amy Kaiser , Thank you for taking time out of your busy schedule to complete your Annual Wellness Visit with me. I enjoyed our conversation and look forward to speaking with you again next year. I, as well as your care team,  appreciate your ongoing commitment to your health goals. Please review the following plan we discussed and let me know if I can assist you in the future. Your Game plan/ To Do List    Referrals: If you haven't heard from the office you've been referred to, please reach out to them at the phone provided.   Follow up Visits: We will see or speak with you next year for your Next Medicare AWV with our clinical staff Have you seen your provider in the last 6 months (3 months if uncontrolled diabetes)? Yes  Clinician Recommendations:  Aim for 30 minutes of exercise or brisk walking, 6-8 glasses of water, and 5 servings of fruits and vegetables each day.       This is a list of the screenings recommended for you:  Health Maintenance  Topic Date Due   Zoster (Shingles) Vaccine (1 of 2) 03/29/1948   DEXA scan (bone density measurement)  Never done   COVID-19 Vaccine (3 - Pfizer risk series) 02/24/2020   Flu Shot  06/05/2024   Medicare Annual Wellness Visit  06/25/2025   DTaP/Tdap/Td vaccine (3 - Td or Tdap) 11/26/2031   Pneumococcal Vaccine for age over 53  Completed   HPV Vaccine  Aged Out   Meningitis B Vaccine  Aged Out    Advanced directives: (Copy Requested) Please bring a copy of your health care power of attorney and living will to the office to be added to your chart at your convenience. You can mail to [facility] [address] [place], [place] [address] or email to [email or web address] Advance Care Planning is important because it:  [x]  Makes sure you receive the medical care that is consistent with your values, goals, and preferences  [x]  It provides guidance to your family and loved ones and reduces their decisional burden about  whether or not they are making the right decisions based on your wishes.  Follow the link provided in your after visit summary or read over the paperwork we have mailed to you to help you started getting your Advance Directives in place. If you need assistance in completing these, please reach out to us  so that we can help you!  See attachments for Preventive Care and Fall Prevention Tips.

## 2024-07-02 ENCOUNTER — Ambulatory Visit (INDEPENDENT_AMBULATORY_CARE_PROVIDER_SITE_OTHER)

## 2024-07-02 VITALS — BP 138/74 | HR 59 | Ht 66.0 in | Wt 139.5 lb

## 2024-07-02 DIAGNOSIS — R630 Anorexia: Secondary | ICD-10-CM

## 2024-07-02 DIAGNOSIS — R443 Hallucinations, unspecified: Secondary | ICD-10-CM | POA: Diagnosis not present

## 2024-07-02 DIAGNOSIS — N390 Urinary tract infection, site not specified: Secondary | ICD-10-CM | POA: Diagnosis not present

## 2024-07-02 DIAGNOSIS — R410 Disorientation, unspecified: Secondary | ICD-10-CM

## 2024-07-02 DIAGNOSIS — E860 Dehydration: Secondary | ICD-10-CM | POA: Diagnosis not present

## 2024-07-02 DIAGNOSIS — K5900 Constipation, unspecified: Secondary | ICD-10-CM | POA: Insufficient documentation

## 2024-07-02 NOTE — Assessment & Plan Note (Signed)
 Inadequate fluid intake contributing to weakness and potential confusion. - Encourage increased water intake. - Use a designated cup to remind her to drink fluids regularly. - Discussed with patient's accompanying friend to make an effort to encourage patient to drink water throughout the day and with every meal. Patient and friend verbalized understanding and were in agreement with the plan.

## 2024-07-02 NOTE — Assessment & Plan Note (Signed)
 Chronic UTI managed with nitrofurantoin . No current UTI symptoms. Hallucinations were worse before nitrofurantoin . - Continue nitrofurantoin  prophylaxis. - Test urine sample for UTI. Pt unable to provide sample today but will bring sample back. If positive for leuks, will empirically treat Abx while awaiting culture.

## 2024-07-02 NOTE — Assessment & Plan Note (Signed)
 Intermittent hallucinations possibly due to chronic UTI, dehydration, or medication side effects. No recent medication changes or UTI symptoms. Dehydration and poor nutrition may contribute to confusion. - Send home with urine cups for UTI testing. - Order blood work to rule out other causes (CBC, CMP, Mg, Phos, Ammonia, TSH, B12/Folate) - Encourage increased water intake given her recent dehydration  - Consider CT scan or MRI of the head and chest X-ray if blood work and urine tests are normal.

## 2024-07-02 NOTE — Patient Instructions (Signed)
 VISIT SUMMARY: Today, we discussed your recent hallucinations, ongoing urinary tract infection (UTI) prevention, post-dental surgery status, generalized weakness and fatigue, and overall health. We reviewed your current medications and functional status, and addressed potential causes for your symptoms.  YOUR PLAN: -HALLUCINATIONS AND CONFUSION: Your hallucinations may be due to chronic UTIs, dehydration, or medication side effects. We will test your urine for a UTI and do blood work to check your blood counts, kidney, and liver function. Please increase your water intake to stay hydrated. If the tests are normal, we may consider a CT scan of your head and a chest X-ray.  -CHRONIC URINARY TRACT INFECTION ON PROPHYLACTIC NITROFURANTOIN : You are taking nitrofurantoin  to prevent UTIs. Since you have no current UTI symptoms, continue with this medication. We will test your urine to ensure there is no infection.  -DEHYDRATION RISK: Not drinking enough fluids can cause weakness and confusion. Please increase your water intake and use a designated cup to remind you to drink regularly.  -POOR ORAL INTAKE AND MALNUTRITION RISK: Due to your recent dental surgery, you have been eating mostly soft foods, which may not provide enough nutrition. Please continue to drink more water and try to eat nutrient-rich soft foods like baby food and protein-rich foods.  -RECENT DENTAL EXTRACTIONS WITH DELAYED HEALING: You are experiencing delayed healing from your recent dental surgery, which makes it difficult to eat solid foods. Continue with your current soft food diet and pain management as needed.  -CONSTIPATION: You have bowel movements every three days, which can contribute to confusion. If constipation becomes uncomfortable, consider using Miralax.  INSTRUCTIONS: Please follow up with the urine test and blood work as discussed. If your symptoms persist or worsen, contact our office. Stay hydrated and maintain a  nutrient-rich diet to support your overall health.  If you have any problems before your next visit feel free to message me via MyChart (minor issues or questions) or call the office, otherwise you may reach out to schedule an office visit.  Thank you! Saddie Sacks, PA-C

## 2024-07-02 NOTE — Assessment & Plan Note (Signed)
 Bowel movements every three days, likely infrequent due to recent poor PO intake.  Constipation can contribute to confusion. - Consider Miralax if constipation becomes uncomfortable.

## 2024-07-02 NOTE — Assessment & Plan Note (Signed)
-  5 lbs down since oral surgery 1 month ago and experiencing poor PO intake of solid foods, leading to reliance on soft foods. Weakness and fatigue likely due to inadequate nutrition. - Encourage increased water intake. - Suggest nutrient-rich soft foods like baby food and protein-rich foods (Ensure).

## 2024-07-02 NOTE — Progress Notes (Signed)
 Acute Office Visit  Subjective:     Patient ID: Amy Kaiser, female    DOB: 17-Jan-1929, 89 y.o.   MRN: [id number]  Chief Complaint  Patient presents with   Hallucinations    HPI  History of Present Illness   Amy Kaiser is a 89 year old female who presents with recent hallucinations.  Visual hallucinations - Hallucinations ongoing for several months, with the most recent episode 2-3 weeks ago - Sees a soldier in her bedroom, snakes, and deceased family members - Episodes occur while awake and cause distress - History of similar episodes in the past, previously associated with urinary tract infections - Denies ETOH use - Denies changes in medications or supplements  - Denies constipation  - Denies flu like symptoms or recent illness  Urinary tract infection prophylaxis and symptoms - On daily nitrofurantoin  for several years for UTI prevention - Prophylaxis does not always prevent infections - No current symptoms of UTI: no dysuria, frequency, or incomplete emptying  Post-dental surgery status - Underwent dental surgery with removal of four teeth from one side of mouth approximately one month ago - Difficulty eating solid foods since procedure; consuming soft foods such as Jell-O and creamy peanut butter - Admits that she is not drinking near enough water, sometimes only one glass per day or just coffee.  - Tylenol  used for pain management post-surgery - No new medications aside from Tylenol  as needed for post-surgical pain  Generalized weakness and fatigue - Weakness and fatigue since dental surgery - Unable to maintain usual physical activity, such as walking to the mailbox, due to fatigue  Medication regimen - Current medications: albuterol  as needed, daily baby aspirin , Celebrex , vitamin D , Lasix , hydralazine , metoprolol , nitrofurantoin , nitroglycerin  as needed, rosuvastatin , B12 supplement - No recent changes in medication regimen  Functional status and  fall risk - Lives with granddaughter who is undergoing cancer treatment; mostly alone during the day - History of falls; cautious about outdoor activities and walks with a cane  Constitutional and other symptoms - No recent flu-like symptoms, congestion, or fever - Bowel movements every three days without discomfort       ROS Per HPI     Objective:    BP 138/74   Pulse (!) 59   Ht 5' 6 (1.676 m)   Wt 139 lb 8 oz (63.3 kg)   SpO2 99%   BMI 22.52 kg/m    Physical Exam Constitutional:      General: She is not in acute distress.    Appearance: Normal appearance.  HENT:     Right Ear: Tympanic membrane normal.     Left Ear: Tympanic membrane normal.     Mouth/Throat:     Mouth: Mucous membranes are moist.     Pharynx: Oropharynx is clear.  Eyes:     Pupils: Pupils are equal, round, and reactive to light.  Cardiovascular:     Rate and Rhythm: Normal rate and regular rhythm.     Heart sounds: Normal heart sounds. No murmur heard.    No friction rub. No gallop.  Pulmonary:     Effort: Pulmonary effort is normal. No respiratory distress.     Breath sounds: Normal breath sounds.  Abdominal:     General: Abdomen is flat. Bowel sounds are normal.     Palpations: Abdomen is soft.  Musculoskeletal:        General: No swelling.     Cervical back: Normal range of motion.  Lymphadenopathy:  Cervical: No cervical adenopathy.  Skin:    General: Skin is warm and dry.  Neurological:     General: No focal deficit present.     Mental Status: She is alert and oriented to person, place, and time.     Cranial Nerves: No cranial nerve deficit.  Psychiatric:        Mood and Affect: Mood normal.        Behavior: Behavior normal.        Thought Content: Thought content normal.     No results found for any visits on 07/02/24.      Assessment & Plan:  Delirium -     POCT urinalysis dipstick -     CBC with Differential/Platelet; Future -     Comprehensive metabolic panel  with GFR; Future -     Magnesium ; Future -     Phosphorus; Future -     TSH; Future -     B12 and Folate Panel; Future -     Ammonia; Future  Hallucinations Assessment & Plan: Intermittent hallucinations possibly due to chronic UTI, dehydration, or medication side effects. No recent medication changes or UTI symptoms. Dehydration and poor nutrition may contribute to confusion. - Send home with urine cups for UTI testing. - Order blood work to rule out other causes (CBC, CMP, Mg, Phos, Ammonia, TSH, B12/Folate) - Encourage increased water intake given her recent dehydration  - Consider CT scan or MRI of the head and chest X-ray if blood work and urine tests are normal.   Chronic urinary tract infection Assessment & Plan: Chronic UTI managed with nitrofurantoin . No current UTI symptoms. Hallucinations were worse before nitrofurantoin . - Continue nitrofurantoin  prophylaxis. - Test urine sample for UTI. Pt unable to provide sample today but will bring sample back. If positive for leuks, will empirically treat Abx while awaiting culture.    Dehydration Assessment & Plan: Inadequate fluid intake contributing to weakness and potential confusion. - Encourage increased water intake. - Use a designated cup to remind her to drink fluids regularly. - Discussed with patient's accompanying friend to make an effort to encourage patient to drink water throughout the day and with every meal. Patient and friend verbalized understanding and were in agreement with the plan.   Poor appetite Assessment & Plan: -5 lbs down since oral surgery 1 month ago and experiencing poor PO intake of solid foods, leading to reliance on soft foods. Weakness and fatigue likely due to inadequate nutrition. - Encourage increased water intake. - Suggest nutrient-rich soft foods like baby food and protein-rich foods (Ensure).    Constipation, unspecified constipation type Assessment & Plan: Bowel movements every three  days, likely infrequent due to recent poor PO intake.  Constipation can contribute to confusion. - Consider Miralax if constipation becomes uncomfortable.     Return if symptoms worsen or fail to improve.  Amy JULIANNA Sacks, PA-C

## 2024-07-03 ENCOUNTER — Other Ambulatory Visit: Payer: Self-pay

## 2024-07-03 DIAGNOSIS — R410 Disorientation, unspecified: Secondary | ICD-10-CM

## 2024-07-03 DIAGNOSIS — N3000 Acute cystitis without hematuria: Secondary | ICD-10-CM

## 2024-07-03 LAB — POCT URINALYSIS DIPSTICK
Bilirubin, UA: NEGATIVE
Blood, UA: NEGATIVE
Glucose, UA: NEGATIVE
Ketones, UA: NEGATIVE
Nitrite, UA: NEGATIVE
Protein, UA: NEGATIVE
Spec Grav, UA: 1.02 (ref 1.010–1.025)
Urobilinogen, UA: 0.2 U/dL
pH, UA: 6 (ref 5.0–8.0)

## 2024-07-03 MED ORDER — CEPHALEXIN 500 MG PO CAPS: 500.0000 mg | ORAL_CAPSULE | Freq: Three times a day (TID) | ORAL | 0 refills | Status: DC

## 2024-07-06 LAB — SPECIMEN STATUS REPORT

## 2024-07-06 LAB — URINE CULTURE

## 2024-07-07 ENCOUNTER — Ambulatory Visit: Payer: Self-pay

## 2024-07-07 NOTE — Telephone Encounter (Signed)
 Pt informed of below.

## 2024-07-07 NOTE — Telephone Encounter (Signed)
 FYI Only or Action Required?: Action required by provider: update on patient condition.  Patient was last seen in primary care on 04/08/2024 by Chandra Toribio POUR, MD.  Called Nurse Triage reporting Headache.  Symptoms began several days ago.  Interventions attempted: OTC medications: tylenol .  Symptoms are: unchanged.  Triage Disposition: See Physician Within 24 Hours  Patient/caregiver understands and will follow disposition?: YesCopied from CRM #[id number]. Topic: Clinical - Red Word Triage >> Jul 07, 2024  1:46 PM Rosaria BRAVO wrote: Red Word that prompted transfer to Nurse Triage: Severe headache, nausea, diarrhea, all began after taking the antibiotic prescribed by [place]. Granddaughter Merchandiser, retail Reason for Disposition  [1] MODERATE headache (e.g., interferes with normal activities) AND [2] present > 24 hours AND [3] unexplained  (Exceptions: Pain medicines not tried, typical migraine, or headache part of viral illness.)  Answer Assessment - Initial Assessment Questions [name] niece called with concerns of headache/nausea/diarrhea. Pt did  not take Keflex  last night or this morning. Pt thought symptoms were from abx. Headaches/nausaea started after starting Keflex .  Pt is lying down with cold rag on face. RN advised pt to hold abx until hearing back from PCP. Please advise      1. LOCATION: Where does it hurt?      Not sure 2. ONSET: When did the headache start? (e.g., minutes, hours, days)      Couple days ago 3. PATTERN: Does the pain come and go, or has it been constant since it started?     Constant  4. SEVERITY: How bad is the pain? and What does it keep you from doing?  (e.g., Scale 1-10; mild, moderate, or severe)     Moderate-severe 5. RECURRENT SYMPTOM: Have you ever had headaches before? If Yes, ask: When was the last time? and What happened that time?      na 6. CAUSE: What do you think is causing the headache?     Not sure 7. MIGRAINE: Have you been  diagnosed with migraine headaches? If Yes, ask: Is this headache similar?      Not srue 8. HEAD INJURY: Has there been any recent injury to your head?      na 9. OTHER SYMPTOMS: Do you have any other symptoms? (e.g., fever, stiff neck, eye pain, sore throat, cold symptoms)     Nausea and diarrhea  Protocols used: Headache-A-AH

## 2024-08-07 ENCOUNTER — Telehealth: Payer: Self-pay

## 2024-08-07 ENCOUNTER — Ambulatory Visit
Admission: RE | Admit: 2024-08-07 | Discharge: 2024-08-07 | Disposition: A | Discharge status: Home / Self Care | Source: Ambulatory Visit | Attending: Family Medicine | Admitting: Family Medicine

## 2024-08-07 ENCOUNTER — Ambulatory Visit (INDEPENDENT_AMBULATORY_CARE_PROVIDER_SITE_OTHER): Admitting: Family Medicine

## 2024-08-07 ENCOUNTER — Encounter: Payer: Self-pay | Admitting: Family Medicine

## 2024-08-07 ENCOUNTER — Ambulatory Visit: Payer: Self-pay | Admitting: Family Medicine

## 2024-08-07 VITALS — BP 149/63 | HR 63 | Ht 66.0 in | Wt 140.8 lb

## 2024-08-07 DIAGNOSIS — R300 Dysuria: Secondary | ICD-10-CM | POA: Diagnosis not present

## 2024-08-07 DIAGNOSIS — M19042 Primary osteoarthritis, left hand: Secondary | ICD-10-CM

## 2024-08-07 DIAGNOSIS — M79642 Pain in left hand: Secondary | ICD-10-CM

## 2024-08-07 DIAGNOSIS — R443 Hallucinations, unspecified: Secondary | ICD-10-CM

## 2024-08-07 DIAGNOSIS — R630 Anorexia: Secondary | ICD-10-CM | POA: Diagnosis not present

## 2024-08-07 DIAGNOSIS — M19041 Primary osteoarthritis, right hand: Secondary | ICD-10-CM | POA: Diagnosis not present

## 2024-08-07 DIAGNOSIS — M79641 Pain in right hand: Secondary | ICD-10-CM | POA: Diagnosis not present

## 2024-08-07 DIAGNOSIS — M25532 Pain in left wrist: Secondary | ICD-10-CM

## 2024-08-07 DIAGNOSIS — M25531 Pain in right wrist: Secondary | ICD-10-CM

## 2024-08-07 LAB — POCT URINALYSIS DIP (CLINITEK)
Bilirubin, UA: NEGATIVE
Blood, UA: NEGATIVE
Glucose, UA: NEGATIVE mg/dL
Ketones, POC UA: NEGATIVE mg/dL
Nitrite, UA: NEGATIVE
POC PROTEIN,UA: NEGATIVE
Spec Grav, UA: 1.015 (ref 1.010–1.025)
Urobilinogen, UA: 0.2 U/dL
pH, UA: 6.5 (ref 5.0–8.0)

## 2024-08-07 NOTE — Telephone Encounter (Signed)
 This RN contacted Duwaine and advised that, per Dr. Adine note, she is to continue the Macrobid  and will be contacted to change antibiotics if indicated once culture is resulted. ED precautions reviewed, [name] verbalized understanding.   Copied from CRM [id number]. Topic: Clinical - Medication Question >> Aug 07, 2024  4:28 PM Donee H wrote: Reason for CRM: Patient's granddaughter Duwaine called wanting to know if Dr. Chandra was going to call in antibiotics for patient's UTI. Please follow up on question [phone]

## 2024-08-07 NOTE — Telephone Encounter (Unsigned)
 Copied from CRM [id number]. Topic: Clinical - Medication Question >> Aug 07, 2024  4:28 PM Donee H wrote: Reason for CRM: Patient's granddaughter Duwaine called wanting to know if Dr. Chandra was going to call in antibiotics for patient's UTI. Please follow up on question [phone]

## 2024-08-07 NOTE — Patient Instructions (Signed)
 It was nice to see you today,  We addressed the following topics today: -It is okay to take your Celebrex  twice a day every day. - This may help you do more outside activities such as gardening with less pain. - The other medication I can offer you that may help your mood is Rexulti or Abilify.  If you ever want to start that let me know.  Have a great day,  Rolan Slain, MD

## 2024-08-07 NOTE — Progress Notes (Signed)
 Established Patient Office Visit  Subjective   Patient ID: Amy Kaiser, female    DOB: [birth year]  Age: 89 y.o. MRN: [id number]  Chief Complaint  Patient presents with   Medical Management of Chronic Issues    HPI  Subjective - Follow-up visit to discuss several issues including recent fall, ongoing dental problems, and medication management. - Reports a scare from a recent fall about a week ago. Fell into a metal chair in the yard, injuring the right side and tailbone. Reports being bed-bound for 2-3 days afterward. Denies loss of consciousness or tripping; states she got tangled in her feet while turning to sit. Today, reports pain in the left wrist, and pain with right index finger flexion. Also notes chronic pain in multiple fingers of both hands, attributed to arthritis and bone spurs. - Reports burning with urination that started this morning. A urine sample was brought from home. - Continues to have difficulty with oral intake following extraction of 8 lower teeth in June. Diet consists of baby food. Is in the process of getting lower dentures but has developed bone spurs that require clipping next week. - Reports that visual hallucinations, which were present at the last visit on 07/03/2023, have resolved. Denies current confusion. - Memory is reported as stable, about the same as 6 months ago. Forgets things but feels she lives in the past, which were happier times. - Mood is described as unhappy and sad due to inability to do activities she enjoys, like yard work. Reports being mostly home-bound in the winter. - Reports significant distress and anxiety related to spiders in the house, which has been ongoing for the past few days.  Medications Celebrex  for arthritis, reports it is helpful. Macrobid  daily for UTI prophylaxis. Previously took a strong antibiotic for a suspected UTI which was discontinued after 2 days due to stomach upset. Unsure of other medications as  granddaughter manages them.  PMH, PSH, FH, Social Hx PMHx: Anemia, osteoarthritis, aortic stenosis s/p bovine valve replacement, low blood sugar. PSH: Extraction of 8 lower teeth (June), bovine aortic valve replacement (date unknown). Social Hx: 89 year old female. Lives with granddaughter who is her caregiver. Enjoys gardening but activity is limited by pain and mobility. Spends winters mostly indoors.  ROS GU: (+) dysuria (burning). MSK: (+) pain in tailbone and right side after fall, (+) left wrist pain, (+) chronic finger pain. Psych: (+) depressed mood, (+) anxiety/stress related to spiders, (-) visual hallucinations, (-) confusion. Constitutional: (-) fever/chills.  The ASCVD Risk score (Arnett DK, et al., 2019) failed to calculate for the following reasons:   The 2019 ASCVD risk score is only valid for ages 22 to 56  Health Maintenance Due  Topic Date Due   Zoster Vaccines- Shingrix (1 of 2) 03/29/1948   DEXA SCAN  Never done   COVID-19 Vaccine (3 - Pfizer risk series) 02/24/2020   Influenza Vaccine  06/05/2024      Objective:     BP (!) 149/63   Pulse 63   Ht 5' 6 (1.676 m)   Wt 140 lb 12.8 oz (63.9 kg)   SpO2 98%   BMI 22.73 kg/m    Physical ExamGen: alert, oriented Cv: rrr, no murmur PULM: Lungs clear to auscultation bilaterally. MSK: Examination of hands reveals pain with palpation over the deep aspect of the left wrist/thenar eminence. Limited flexion of some fingers on both hands. No visible deformity or swelling of the wrist. Notes pain in multiple fingers on both hands.  Results for orders placed or performed in visit on 08/07/24  POCT URINALYSIS DIP (CLINITEK)  Result Value Ref Range   Color, UA yellow yellow   Clarity, UA clear clear   Glucose, UA negative negative mg/dL   Bilirubin, UA negative negative   Ketones, POC UA negative negative mg/dL   Spec Grav, UA 8.984 8.989 - 1.025   Blood, UA negative negative   pH, UA 6.5 5.0 - 8.0   POC  PROTEIN,UA negative negative, trace   Urobilinogen, UA 0.2 0.2 or 1.0 E.U./dL   Nitrite, UA Negative Negative   Leukocytes, UA Moderate (2+) (A) Negative        Assessment & Plan:   Arthritis of both hands Assessment & Plan: The patient has significant osteoarthritis, particularly in the hands, limiting her activity and affecting her quality of life. Recent fall has exacerbated pain, particularly in the left wrist. The pain contributes to her low mood. - Continue Celebrex , can take up to twice daily for pain control to improve activity level. - Obtain x-ray of bilateral wrists, with focus on the left, to rule out occult fracture (e.g., scaphoid) from recent fall. Order sent to [facility] for walk-in.   Hallucinations Assessment & Plan: Hallucinations have resolved    Poor appetite Assessment & Plan: Patient is s/p multiple tooth extractions and is on a soft diet, awaiting lower dentures. This impacts nutrition and overall well-being. - Continue current diet as tolerated.   Pain in both wrists -     DG Wrist Complete Left; Future -     DG Wrist Complete Right; Future  Pain in both hands -     DG HANDS 1 VIEW BILAT BALLCATCHERS; Future  Dysuria Assessment & Plan: Reports new onset burning with urination today. A previous urine culture on 07/03/2023 was negative for significant infection. A dipstick today was inconclusive. - Urine sample sent for culture and sensitivity. - If culture is positive, a new prescription for an appropriate antibiotic will be sent. - Continue Macrobid  for prophylaxis.  Orders: -     POCT URINALYSIS DIP (CLINITEK) -     Urine Culture; Future     Return in about 6 months (around 02/05/2025) for make sure the visit is a 11:10 slot or make it a 40 min visit.    Toribio MARLA Slain, MD

## 2024-08-09 DIAGNOSIS — R300 Dysuria: Secondary | ICD-10-CM | POA: Insufficient documentation

## 2024-08-09 NOTE — Assessment & Plan Note (Signed)
 Reports new onset burning with urination today. A previous urine culture on 07/03/2023 was negative for significant infection. A dipstick today was inconclusive. - Urine sample sent for culture and sensitivity. - If culture is positive, a new prescription for an appropriate antibiotic will be sent. - Continue Macrobid  for prophylaxis.

## 2024-08-09 NOTE — Assessment & Plan Note (Signed)
 The patient has significant osteoarthritis, particularly in the hands, limiting her activity and affecting her quality of life. Recent fall has exacerbated pain, particularly in the left wrist. The pain contributes to her low mood. - Continue Celebrex , can take up to twice daily for pain control to improve activity level. - Obtain x-ray of bilateral wrists, with focus on the left, to rule out occult fracture (e.g., scaphoid) from recent fall. Order sent to [facility] for walk-in.

## 2024-08-09 NOTE — Assessment & Plan Note (Signed)
 Patient is s/p multiple tooth extractions and is on a soft diet, awaiting lower dentures. This impacts nutrition and overall well-being. - Continue current diet as tolerated.

## 2024-08-09 NOTE — Assessment & Plan Note (Signed)
 Hallucinations have resolved

## 2024-08-10 LAB — URINE CULTURE

## 2024-08-11 DIAGNOSIS — K080 Exfoliation of teeth due to systemic causes: Secondary | ICD-10-CM | POA: Diagnosis not present

## 2024-08-14 ENCOUNTER — Other Ambulatory Visit

## 2024-08-14 DIAGNOSIS — R410 Disorientation, unspecified: Secondary | ICD-10-CM

## 2024-08-14 DIAGNOSIS — D649 Anemia, unspecified: Secondary | ICD-10-CM

## 2024-08-15 ENCOUNTER — Ambulatory Visit: Payer: Self-pay

## 2024-08-15 LAB — COMPREHENSIVE METABOLIC PANEL WITH GFR
ALT: 11 IU/L (ref 0–32)
AST: 141 IU/L — ABNORMAL HIGH (ref 0–40)
Albumin: 4.3 g/dL (ref 3.6–4.6)
Alkaline Phosphatase: 67 IU/L (ref 48–129)
BUN/Creatinine Ratio: 26 (ref 12–28)
BUN: 31 mg/dL (ref 10–36)
Bilirubin Total: 0.7 mg/dL (ref 0.0–1.2)
CO2: 21 mmol/L (ref 20–29)
Calcium: 9 mg/dL (ref 8.7–10.3)
Chloride: 104 mmol/L (ref 96–106)
Creatinine, Ser: 1.17 mg/dL — ABNORMAL HIGH (ref 0.57–1.00)
Globulin, Total: 3 g/dL (ref 1.5–4.5)
Glucose: 97 mg/dL (ref 70–99)
Potassium: 5 mmol/L (ref 3.5–5.2)
Sodium: 138 mmol/L (ref 134–144)
Total Protein: 7.3 g/dL (ref 6.0–8.5)
eGFR: 44 mL/min/1.73 — ABNORMAL LOW (ref >59)

## 2024-08-15 LAB — CBC WITH DIFFERENTIAL/PLATELET
Basophils Absolute: 0.1 x10E3/uL (ref 0.0–0.2)
Basos: 1 %
EOS (ABSOLUTE): 0.2 x10E3/uL (ref 0.0–0.4)
Eos: 3 %
Hematocrit: 34.7 % (ref 34.0–46.6)
Hemoglobin: 11 g/dL — ABNORMAL LOW (ref 11.1–15.9)
Immature Grans (Abs): 0 x10E3/uL (ref 0.0–0.1)
Immature Granulocytes: 0 %
Lymphocytes Absolute: 2.4 x10E3/uL (ref 0.7–3.1)
Lymphs: 37 %
MCH: 31.5 pg (ref 26.6–33.0)
MCHC: 31.7 g/dL (ref 31.5–35.7)
MCV: 99 fL — ABNORMAL HIGH (ref 79–97)
Monocytes Absolute: 0.5 x10E3/uL (ref 0.1–0.9)
Monocytes: 8 %
Neutrophils Absolute: 3.4 x10E3/uL (ref 1.4–7.0)
Neutrophils: 51 %
Platelets: 230 x10E3/uL (ref 150–450)
RBC: 3.49 x10E6/uL — ABNORMAL LOW (ref 3.77–5.28)
RDW: 13.1 % (ref 11.7–15.4)
WBC: 6.6 x10E3/uL (ref 3.4–10.8)

## 2024-08-15 LAB — AMMONIA: Ammonia: 32 ug/dL (ref 28–135)

## 2024-08-15 LAB — MAGNESIUM: Magnesium: 2.3 mg/dL (ref 1.6–2.3)

## 2024-08-15 LAB — PHOSPHORUS: Phosphorus: 4.2 mg/dL (ref 3.0–4.3)

## 2024-08-15 LAB — B12 AND FOLATE PANEL
Folate: 15 ng/mL (ref >3.0)
Vitamin B-12: 810 pg/mL (ref 232–1245)

## 2024-08-15 LAB — IRON,TIBC AND FERRITIN PANEL
Ferritin: 23 ng/mL (ref 15–150)
Iron Saturation: 14 % — ABNORMAL LOW (ref 15–55)
Iron: 54 ug/dL (ref 27–139)
Total Iron Binding Capacity: 376 ug/dL (ref 250–450)
UIBC: 322 ug/dL (ref 118–369)

## 2024-08-15 LAB — TSH: TSH: 2.73 u[IU]/mL (ref 0.450–4.500)

## 2024-09-01 DIAGNOSIS — B8801 Infestation by demodex mites: Secondary | ICD-10-CM | POA: Diagnosis not present

## 2024-09-01 DIAGNOSIS — H0288B Meibomian gland dysfunction left eye, upper and lower eyelids: Secondary | ICD-10-CM | POA: Diagnosis not present

## 2024-09-01 DIAGNOSIS — H0288A Meibomian gland dysfunction right eye, upper and lower eyelids: Secondary | ICD-10-CM | POA: Diagnosis not present

## 2024-09-01 DIAGNOSIS — H1589 Other disorders of sclera: Secondary | ICD-10-CM | POA: Diagnosis not present

## 2024-09-03 ENCOUNTER — Other Ambulatory Visit: Payer: Self-pay | Admitting: Family Medicine

## 2024-09-07 ENCOUNTER — Encounter: Payer: Self-pay | Admitting: Radiology

## 2024-09-08 ENCOUNTER — Encounter: Payer: Self-pay | Admitting: Family Medicine

## 2024-09-08 DIAGNOSIS — N390 Urinary tract infection, site not specified: Secondary | ICD-10-CM

## 2024-11-27 ENCOUNTER — Other Ambulatory Visit: Payer: Self-pay | Admitting: Family Medicine

## 2024-11-27 DIAGNOSIS — N390 Urinary tract infection, site not specified: Secondary | ICD-10-CM

## 2024-12-03 ENCOUNTER — Other Ambulatory Visit: Payer: Self-pay | Admitting: Family Medicine

## 2024-12-04 ENCOUNTER — Other Ambulatory Visit: Payer: Self-pay | Admitting: Cardiovascular Disease

## 2025-02-05 ENCOUNTER — Ambulatory Visit: Admitting: Family Medicine

## 2025-02-09 ENCOUNTER — Ambulatory Visit: Admitting: Family Medicine

## 2025-08-19 ENCOUNTER — Ambulatory Visit
